# Patient Record
Sex: Female | Born: 1955 | Race: Black or African American | Hispanic: No | Marital: Married | Smoking: Never smoker
Health system: Southern US, Community
[De-identification: ages and names within clinical notes are randomized; demographics above are authoritative.]

## PROBLEM LIST (undated history)

## (undated) DIAGNOSIS — I10 Essential (primary) hypertension: Secondary | ICD-10-CM

## (undated) DIAGNOSIS — H3552 Pigmentary retinal dystrophy: Secondary | ICD-10-CM

## (undated) DIAGNOSIS — K219 Gastro-esophageal reflux disease without esophagitis: Secondary | ICD-10-CM

## (undated) DIAGNOSIS — M1990 Unspecified osteoarthritis, unspecified site: Secondary | ICD-10-CM

## (undated) DIAGNOSIS — E785 Hyperlipidemia, unspecified: Secondary | ICD-10-CM

## (undated) HISTORY — DX: Gastro-esophageal reflux disease without esophagitis: K21.9

## (undated) HISTORY — DX: Hyperlipidemia, unspecified: E78.5

## (undated) HISTORY — DX: Essential (primary) hypertension: I10

## (undated) HISTORY — DX: Pigmentary retinal dystrophy: H35.52

## (undated) HISTORY — DX: Unspecified osteoarthritis, unspecified site: M19.90

## (undated) HISTORY — PX: ABDOMINAL HYSTERECTOMY: SHX81

---

## 1999-10-09 ENCOUNTER — Encounter: Admission: RE | Admit: 1999-10-09 | Discharge: 1999-10-09 | Payer: Self-pay | Admitting: Obstetrics and Gynecology

## 1999-10-09 ENCOUNTER — Encounter: Payer: Self-pay | Admitting: Obstetrics and Gynecology

## 2000-02-08 ENCOUNTER — Encounter: Admission: RE | Admit: 2000-02-08 | Discharge: 2000-02-08 | Payer: Self-pay | Admitting: Obstetrics and Gynecology

## 2000-02-08 ENCOUNTER — Encounter: Payer: Self-pay | Admitting: Obstetrics and Gynecology

## 2000-10-20 ENCOUNTER — Encounter: Admission: RE | Admit: 2000-10-20 | Discharge: 2000-10-20 | Payer: Self-pay | Admitting: Obstetrics and Gynecology

## 2000-10-20 ENCOUNTER — Encounter: Payer: Self-pay | Admitting: Obstetrics and Gynecology

## 2000-10-24 ENCOUNTER — Encounter: Admission: RE | Admit: 2000-10-24 | Discharge: 2000-10-24 | Payer: Self-pay | Admitting: Obstetrics and Gynecology

## 2000-10-24 ENCOUNTER — Encounter: Payer: Self-pay | Admitting: Obstetrics and Gynecology

## 2000-12-20 ENCOUNTER — Encounter: Payer: Self-pay | Admitting: Emergency Medicine

## 2000-12-20 ENCOUNTER — Emergency Department (HOSPITAL_COMMUNITY): Admission: EM | Admit: 2000-12-20 | Discharge: 2000-12-20 | Payer: Self-pay | Admitting: Emergency Medicine

## 2001-03-30 ENCOUNTER — Encounter: Admission: RE | Admit: 2001-03-30 | Discharge: 2001-03-30 | Payer: Self-pay | Admitting: Obstetrics and Gynecology

## 2001-03-30 ENCOUNTER — Encounter: Payer: Self-pay | Admitting: Obstetrics and Gynecology

## 2002-10-24 ENCOUNTER — Encounter: Admission: RE | Admit: 2002-10-24 | Discharge: 2002-10-24 | Payer: Self-pay | Admitting: Obstetrics and Gynecology

## 2002-10-24 ENCOUNTER — Encounter: Payer: Self-pay | Admitting: Obstetrics and Gynecology

## 2003-10-29 ENCOUNTER — Encounter: Admission: RE | Admit: 2003-10-29 | Discharge: 2003-10-29 | Payer: Self-pay | Admitting: Obstetrics and Gynecology

## 2004-11-13 ENCOUNTER — Encounter: Admission: RE | Admit: 2004-11-13 | Discharge: 2004-11-13 | Payer: Self-pay | Admitting: Obstetrics and Gynecology

## 2005-11-18 ENCOUNTER — Encounter: Admission: RE | Admit: 2005-11-18 | Discharge: 2005-11-18 | Payer: Self-pay | Admitting: Obstetrics and Gynecology

## 2005-12-06 ENCOUNTER — Ambulatory Visit: Payer: Self-pay | Admitting: Internal Medicine

## 2005-12-10 ENCOUNTER — Encounter: Admission: RE | Admit: 2005-12-10 | Discharge: 2005-12-10 | Payer: Self-pay | Admitting: Obstetrics and Gynecology

## 2005-12-14 ENCOUNTER — Ambulatory Visit: Payer: Self-pay | Admitting: Internal Medicine

## 2006-01-11 ENCOUNTER — Ambulatory Visit: Payer: Self-pay | Admitting: Internal Medicine

## 2006-03-16 ENCOUNTER — Ambulatory Visit: Payer: Self-pay | Admitting: Internal Medicine

## 2006-06-13 ENCOUNTER — Ambulatory Visit: Payer: Self-pay | Admitting: Internal Medicine

## 2006-08-19 ENCOUNTER — Ambulatory Visit: Payer: Self-pay | Admitting: Internal Medicine

## 2006-08-19 LAB — CONVERTED CEMR LAB
Folate: 6.6 ng/mL
Vitamin B-12: 448 pg/mL (ref 211–911)

## 2006-08-31 ENCOUNTER — Ambulatory Visit: Payer: Self-pay | Admitting: Internal Medicine

## 2006-10-07 ENCOUNTER — Ambulatory Visit: Payer: Self-pay | Admitting: Internal Medicine

## 2006-11-21 ENCOUNTER — Encounter: Admission: RE | Admit: 2006-11-21 | Discharge: 2006-11-21 | Payer: Self-pay | Admitting: Obstetrics and Gynecology

## 2006-12-05 ENCOUNTER — Encounter: Admission: RE | Admit: 2006-12-05 | Discharge: 2006-12-05 | Payer: Self-pay | Admitting: Obstetrics and Gynecology

## 2007-03-31 ENCOUNTER — Ambulatory Visit: Payer: Self-pay | Admitting: Internal Medicine

## 2007-03-31 LAB — CONVERTED CEMR LAB
Albumin: 3.8 g/dL (ref 3.5–5.2)
Basophils Absolute: 0 10*3/uL (ref 0.0–0.1)
Bilirubin, Direct: 0.1 mg/dL (ref 0.0–0.3)
Direct LDL: 134.2 mg/dL
Eosinophils Absolute: 0.1 10*3/uL (ref 0.0–0.6)
GFR calc Af Amer: 113 mL/min
GFR calc non Af Amer: 93 mL/min
HCT: 39 % (ref 36.0–46.0)
Hemoglobin: 13.1 g/dL (ref 12.0–15.0)
Hgb A1c MFr Bld: 6.2 % — ABNORMAL HIGH (ref 4.6–6.0)
Lymphocytes Relative: 43.5 % (ref 12.0–46.0)
MCHC: 33.5 g/dL (ref 30.0–36.0)
MCV: 90.3 fL (ref 78.0–100.0)
Monocytes Absolute: 0.6 10*3/uL (ref 0.2–0.7)
Neutro Abs: 2.6 10*3/uL (ref 1.4–7.7)
Neutrophils Relative %: 43.2 % (ref 43.0–77.0)
Potassium: 3.6 meq/L (ref 3.5–5.1)
RBC: 4.32 M/uL (ref 3.87–5.11)
Sodium: 140 meq/L (ref 135–145)
Triglycerides: 124 mg/dL (ref 0–149)

## 2007-04-07 ENCOUNTER — Ambulatory Visit: Payer: Self-pay | Admitting: Internal Medicine

## 2007-05-01 ENCOUNTER — Ambulatory Visit: Payer: Self-pay | Admitting: Gastroenterology

## 2007-05-16 ENCOUNTER — Ambulatory Visit: Payer: Self-pay | Admitting: Gastroenterology

## 2007-05-16 ENCOUNTER — Encounter: Payer: Self-pay | Admitting: Internal Medicine

## 2007-05-16 ENCOUNTER — Encounter: Payer: Self-pay | Admitting: Gastroenterology

## 2007-05-16 LAB — HM COLONOSCOPY

## 2007-05-18 DIAGNOSIS — I10 Essential (primary) hypertension: Secondary | ICD-10-CM

## 2007-05-18 DIAGNOSIS — I7300 Raynaud's syndrome without gangrene: Secondary | ICD-10-CM | POA: Insufficient documentation

## 2007-07-07 ENCOUNTER — Ambulatory Visit: Payer: Self-pay | Admitting: Internal Medicine

## 2007-07-07 DIAGNOSIS — B351 Tinea unguium: Secondary | ICD-10-CM | POA: Insufficient documentation

## 2007-07-07 DIAGNOSIS — E785 Hyperlipidemia, unspecified: Secondary | ICD-10-CM

## 2007-07-07 DIAGNOSIS — E1169 Type 2 diabetes mellitus with other specified complication: Secondary | ICD-10-CM

## 2007-07-07 DIAGNOSIS — H3552 Pigmentary retinal dystrophy: Secondary | ICD-10-CM | POA: Insufficient documentation

## 2007-07-07 DIAGNOSIS — R1313 Dysphagia, pharyngeal phase: Secondary | ICD-10-CM

## 2007-07-07 LAB — CONVERTED CEMR LAB
CO2: 30 meq/L (ref 19–32)
Cholesterol, target level: 200 mg/dL
GFR calc Af Amer: 97 mL/min
GFR calc non Af Amer: 80 mL/min
Glucose, Bld: 117 mg/dL — ABNORMAL HIGH (ref 70–99)
LDL Goal: 100 mg/dL
Sodium: 142 meq/L (ref 135–145)

## 2007-08-08 ENCOUNTER — Ambulatory Visit: Payer: Self-pay | Admitting: Gastroenterology

## 2007-08-21 ENCOUNTER — Ambulatory Visit: Payer: Self-pay | Admitting: Cardiology

## 2007-08-31 ENCOUNTER — Ambulatory Visit: Payer: Self-pay

## 2007-08-31 ENCOUNTER — Encounter: Payer: Self-pay | Admitting: Cardiology

## 2007-09-12 ENCOUNTER — Ambulatory Visit: Payer: Self-pay | Admitting: Cardiology

## 2007-09-18 ENCOUNTER — Encounter: Payer: Self-pay | Admitting: Internal Medicine

## 2007-10-23 ENCOUNTER — Ambulatory Visit: Payer: Self-pay | Admitting: Gastroenterology

## 2007-10-26 LAB — CONVERTED CEMR LAB: Pap Smear: NORMAL

## 2007-11-15 ENCOUNTER — Ambulatory Visit: Payer: Self-pay | Admitting: Gastroenterology

## 2007-11-22 ENCOUNTER — Ambulatory Visit: Payer: Self-pay | Admitting: Gastroenterology

## 2007-11-22 ENCOUNTER — Encounter: Payer: Self-pay | Admitting: Internal Medicine

## 2007-12-07 ENCOUNTER — Encounter: Admission: RE | Admit: 2007-12-07 | Discharge: 2007-12-07 | Payer: Self-pay | Admitting: Obstetrics and Gynecology

## 2007-12-29 DIAGNOSIS — K644 Residual hemorrhoidal skin tags: Secondary | ICD-10-CM | POA: Insufficient documentation

## 2007-12-29 DIAGNOSIS — D126 Benign neoplasm of colon, unspecified: Secondary | ICD-10-CM | POA: Insufficient documentation

## 2008-05-28 ENCOUNTER — Telehealth: Payer: Self-pay | Admitting: Internal Medicine

## 2008-06-04 ENCOUNTER — Ambulatory Visit: Payer: Self-pay | Admitting: Internal Medicine

## 2008-06-04 LAB — CONVERTED CEMR LAB
BUN: 8 mg/dL (ref 6–23)
Chloride: 105 meq/L (ref 96–112)
Creatinine, Ser: 0.8 mg/dL (ref 0.4–1.2)
GFR calc Af Amer: 96 mL/min
GFR calc non Af Amer: 80 mL/min
Glucose, Bld: 103 mg/dL — ABNORMAL HIGH (ref 70–99)

## 2008-07-16 ENCOUNTER — Ambulatory Visit: Payer: Self-pay | Admitting: Internal Medicine

## 2008-11-15 ENCOUNTER — Ambulatory Visit: Payer: Self-pay | Admitting: Internal Medicine

## 2008-12-24 ENCOUNTER — Ambulatory Visit: Payer: Self-pay | Admitting: Internal Medicine

## 2008-12-24 LAB — CONVERTED CEMR LAB
ALT: 18 U/L
AST: 17 U/L
Albumin: 4 g/dL
Alkaline Phosphatase: 68 U/L
BUN: 8 mg/dL
Basophils Absolute: 0 K/uL
Basophils Relative: 0.2 %
Bilirubin Urine: NEGATIVE
Bilirubin, Direct: 0.1 mg/dL
Blood in Urine, dipstick: NEGATIVE
CO2: 30 meq/L
Calcium: 9.4 mg/dL
Chloride: 102 meq/L
Cholesterol: 202 mg/dL
Creatinine, Ser: 0.8 mg/dL
Creatinine,U: 264 mg/dL
Direct LDL: 133.3 mg/dL
Eosinophils Absolute: 0.1 K/uL
Eosinophils Relative: 1.9 %
GFR calc Af Amer: 96 mL/min
GFR calc non Af Amer: 79 mL/min
Glucose, Bld: 135 mg/dL — ABNORMAL HIGH
Glucose, Urine, Semiquant: NEGATIVE
HCT: 37.9 %
HDL: 39.9 mg/dL
Hemoglobin: 12.9 g/dL
Hgb A1c MFr Bld: 6.9 % — ABNORMAL HIGH
Lymphocytes Relative: 42 %
MCHC: 34 g/dL
MCV: 88.4 fL
Microalb Creat Ratio: 4.2 mg/g
Microalb, Ur: 1.1 mg/dL
Monocytes Absolute: 0.5 K/uL
Monocytes Relative: 9.6 %
Neutro Abs: 2.2 K/uL
Neutrophils Relative %: 46.3 %
Nitrite: NEGATIVE
Platelets: 212 K/uL
Potassium: 3.8 meq/L
RBC: 4.29 M/uL
RDW: 14.4 %
Sodium: 140 meq/L
Specific Gravity, Urine: 1.015
TSH: 1.45 u[IU]/mL
Total Bilirubin: 0.8 mg/dL
Total CHOL/HDL Ratio: 5.1
Total Protein: 8 g/dL
Triglycerides: 95 mg/dL
Urobilinogen, UA: 1
VLDL: 19 mg/dL
WBC Urine, dipstick: NEGATIVE
WBC: 4.8 10*3/microliter
pH: 8.5

## 2008-12-31 ENCOUNTER — Ambulatory Visit: Payer: Self-pay | Admitting: Internal Medicine

## 2009-01-20 ENCOUNTER — Telehealth (INDEPENDENT_AMBULATORY_CARE_PROVIDER_SITE_OTHER): Payer: Self-pay | Admitting: *Deleted

## 2009-03-31 ENCOUNTER — Telehealth: Payer: Self-pay | Admitting: Internal Medicine

## 2009-04-23 LAB — CONVERTED CEMR LAB: Pap Smear: NORMAL

## 2009-06-27 ENCOUNTER — Ambulatory Visit: Payer: Self-pay | Admitting: Family Medicine

## 2009-07-01 ENCOUNTER — Encounter: Admission: RE | Admit: 2009-07-01 | Discharge: 2009-07-01 | Payer: Self-pay | Admitting: Obstetrics and Gynecology

## 2009-07-01 ENCOUNTER — Ambulatory Visit: Payer: Self-pay | Admitting: Internal Medicine

## 2009-07-01 LAB — CONVERTED CEMR LAB
ALT: 25 units/L (ref 0–35)
Albumin: 4.1 g/dL (ref 3.5–5.2)
Alkaline Phosphatase: 76 units/L (ref 39–117)
Bilirubin, Direct: 0 mg/dL (ref 0.0–0.3)
Cholesterol: 218 mg/dL — ABNORMAL HIGH (ref 0–200)
HDL: 44.7 mg/dL (ref >39.00)
Total Protein: 7.9 g/dL (ref 6.0–8.3)
VLDL: 21.8 mg/dL (ref 0.0–40.0)

## 2009-07-10 ENCOUNTER — Ambulatory Visit: Payer: Self-pay | Admitting: Internal Medicine

## 2009-07-10 DIAGNOSIS — L210 Seborrhea capitis: Secondary | ICD-10-CM | POA: Insufficient documentation

## 2009-07-10 DIAGNOSIS — C4490 Unspecified malignant neoplasm of skin, unspecified: Secondary | ICD-10-CM

## 2009-07-15 ENCOUNTER — Telehealth: Payer: Self-pay | Admitting: Internal Medicine

## 2009-07-21 ENCOUNTER — Telehealth: Payer: Self-pay | Admitting: Internal Medicine

## 2009-08-14 ENCOUNTER — Ambulatory Visit: Payer: Self-pay | Admitting: Internal Medicine

## 2009-08-14 LAB — CONVERTED CEMR LAB
Cholesterol: 229 mg/dL — ABNORMAL HIGH (ref 0–200)
Direct LDL: 172.4 mg/dL
HDL: 38.7 mg/dL — ABNORMAL LOW (ref >39.00)

## 2009-09-08 ENCOUNTER — Ambulatory Visit: Payer: Self-pay | Admitting: Family Medicine

## 2009-09-08 DIAGNOSIS — E119 Type 2 diabetes mellitus without complications: Secondary | ICD-10-CM

## 2009-09-08 DIAGNOSIS — E118 Type 2 diabetes mellitus with unspecified complications: Secondary | ICD-10-CM | POA: Insufficient documentation

## 2009-09-08 LAB — CONVERTED CEMR LAB: Hgb A1c MFr Bld: 13.2 % — ABNORMAL HIGH (ref 4.6–6.5)

## 2009-09-10 ENCOUNTER — Telehealth: Payer: Self-pay | Admitting: *Deleted

## 2009-10-01 ENCOUNTER — Ambulatory Visit: Payer: Self-pay | Admitting: Internal Medicine

## 2009-11-07 ENCOUNTER — Ambulatory Visit: Payer: Self-pay | Admitting: Internal Medicine

## 2009-11-07 LAB — CONVERTED CEMR LAB
CO2: 18 meq/L — ABNORMAL LOW (ref 19–32)
Chloride: 103 meq/L (ref 96–112)
Glucose, Bld: 101 mg/dL — ABNORMAL HIGH (ref 70–99)
Sodium: 141 meq/L (ref 135–145)

## 2009-11-14 ENCOUNTER — Ambulatory Visit: Payer: Self-pay | Admitting: Internal Medicine

## 2009-12-24 ENCOUNTER — Ambulatory Visit: Payer: Self-pay | Admitting: Internal Medicine

## 2009-12-24 LAB — CONVERTED CEMR LAB
Alkaline Phosphatase: 59 units/L (ref 39–117)
Basophils Relative: 0.2 % (ref 0.0–3.0)
Bilirubin Urine: NEGATIVE
Bilirubin, Direct: 0.1 mg/dL (ref 0.0–0.3)
CO2: 33 meq/L — ABNORMAL HIGH (ref 19–32)
Calcium: 9.6 mg/dL (ref 8.4–10.5)
Cholesterol: 224 mg/dL — ABNORMAL HIGH (ref 0–200)
Creatinine, Ser: 0.8 mg/dL (ref 0.4–1.2)
Creatinine,U: 182.2 mg/dL
Direct LDL: 159.3 mg/dL
Eosinophils Absolute: 0.1 10*3/uL (ref 0.0–0.7)
GFR calc non Af Amer: 95.74 mL/min (ref >60)
HDL: 53.5 mg/dL (ref >39.00)
Ketones, urine, test strip: NEGATIVE
Lymphocytes Relative: 34.4 % (ref 12.0–46.0)
MCHC: 32.9 g/dL (ref 30.0–36.0)
Microalb, Ur: 0.8 mg/dL (ref 0.0–1.9)
Monocytes Relative: 7.8 % (ref 3.0–12.0)
Neutrophils Relative %: 55.9 % (ref 43.0–77.0)
Nitrite: NEGATIVE
Protein, U semiquant: NEGATIVE
RBC: 3.93 M/uL (ref 3.87–5.11)
Total CHOL/HDL Ratio: 4
Total Protein: 8 g/dL (ref 6.0–8.3)
Triglycerides: 94 mg/dL (ref 0.0–149.0)
Urobilinogen, UA: 0.2
VLDL: 18.8 mg/dL (ref 0.0–40.0)
WBC: 5.2 10*3/uL (ref 4.5–10.5)

## 2009-12-31 ENCOUNTER — Ambulatory Visit: Payer: Self-pay | Admitting: Internal Medicine

## 2010-02-19 ENCOUNTER — Telehealth: Payer: Self-pay | Admitting: Internal Medicine

## 2010-03-25 LAB — HM PAP SMEAR

## 2010-03-31 ENCOUNTER — Ambulatory Visit: Payer: Self-pay | Admitting: Internal Medicine

## 2010-03-31 LAB — CONVERTED CEMR LAB
ALT: 16 units/L (ref 0–35)
Alkaline Phosphatase: 59 units/L (ref 39–117)
Bilirubin, Direct: 0.1 mg/dL (ref 0.0–0.3)
HDL: 48.4 mg/dL (ref >39.00)
Total Protein: 7.7 g/dL (ref 6.0–8.3)

## 2010-04-07 ENCOUNTER — Ambulatory Visit: Payer: Self-pay | Admitting: Internal Medicine

## 2010-07-02 ENCOUNTER — Ambulatory Visit: Payer: Self-pay | Admitting: Internal Medicine

## 2010-07-02 LAB — CONVERTED CEMR LAB
AST: 15 units/L (ref 0–37)
Alkaline Phosphatase: 58 units/L (ref 39–117)
Bilirubin, Direct: 0.1 mg/dL (ref 0.0–0.3)
Total Bilirubin: 0.6 mg/dL (ref 0.3–1.2)
Total CHOL/HDL Ratio: 5

## 2010-07-03 ENCOUNTER — Encounter: Admission: RE | Admit: 2010-07-03 | Discharge: 2010-07-03 | Payer: Self-pay | Admitting: Obstetrics and Gynecology

## 2010-07-14 ENCOUNTER — Ambulatory Visit: Payer: Self-pay | Admitting: Internal Medicine

## 2010-09-29 ENCOUNTER — Ambulatory Visit: Payer: Self-pay | Admitting: Internal Medicine

## 2010-09-29 LAB — CONVERTED CEMR LAB
Albumin: 3.9 g/dL (ref 3.5–5.2)
HDL: 45.7 mg/dL (ref >39.00)
LDL Cholesterol: 126 mg/dL — ABNORMAL HIGH (ref 0–99)
Total CHOL/HDL Ratio: 4
Triglycerides: 77 mg/dL (ref 0.0–149.0)

## 2010-10-06 ENCOUNTER — Ambulatory Visit: Payer: Self-pay | Admitting: Internal Medicine

## 2010-11-15 ENCOUNTER — Encounter: Payer: Self-pay | Admitting: Obstetrics and Gynecology

## 2010-11-15 ENCOUNTER — Encounter: Payer: Self-pay | Admitting: Internal Medicine

## 2010-11-24 NOTE — Progress Notes (Signed)
Summary: new rx request---was on samples  Phone Note Refill Request Call back at Work Phone [phone] Message from:  Patient---live call  Refills Requested: Medication #1:  JANUMET 50-1000 MG TABS one by mouth two times a day [employer].Marland KitchenMarland KitchenMarland KitchenNew rx request.  Was on samples  Initial call taken by: Warnell Forester,  February 19, 2010 10:02 AM Caller: Patient---live call    Prescriptions: JANUMET 50-1000 MG TABS (SITAGLIPTIN-METFORMIN HCL) one by mouth two times a day  #60 x 6   Entered by:   Willy Eddy, LPN   Authorized by:   Stacie Glaze MD   Signed by:   Willy Eddy, LPN on 09/81/1914   Method used:   Electronically to        [facility] [address] [phone]* (retail)       [address]       [place], [place]  [address]       Ph: [phone]       Fax: [phone]   RxID:   [id number]

## 2010-11-24 NOTE — Assessment & Plan Note (Signed)
Summary: CPX/NO PAP/NJR   Vital Signs:  Patient profile:   55 year old female Height:      65 inches Weight:      182 pounds BMI:     30.40 Temp:     98.2 degrees F oral Pulse rate:   92 / minute Resp:     14 per minute BP sitting:   132 / 80  (left arm)  Vitals Entered By: Willy Eddy, LPN (December 31, 1608 2:15 PM) CC: cpx Is Patient Diabetic? Yes Did you bring your meter with you today? No   CC:  cpx.  History of Present Illness: The pt was asked about all immunizations, health maint. 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 One By Mouth Daily 2)  Prevacid 15 Mg Cpdr (Lansoprazole) .... Once Daily 3)  Vitamin D 2000 Unit Tabs (Cholecalciferol) .Marland Kitchen.. 1 Once Daily Along With 81191 Weekly 4)  Clobetasol Propionate 0.05 % Foam (Clobetasol Propionate) .... Apply To Sclap As Directed 5)  Fish Oil Concentrate 1000 Mg Caps (Omega-3 Fatty Acids) .... One By Mouth  Two Times A Day or Two A Day 6)   Derma-Smoothe/fs Scalp 0.01 % Oil (Fluocinolone Acetonide) .... Apply To Scalp As Directed 7)  Janumet 50-1000 Mg Tabs (Sitagliptin-Metformin Hcl) .... One By Mouth Two Times A Day 8)  Accu-Chek Aviva  Strp (Glucose Blood) .... Test Cbg's Two Times A Day As Directed  Current Medications (verified): 1)  Benicar Hct 20-12.5 Mg  Tabs (Olmesartan Medoxomil-Hctz) .... One By Mouth Daily 2)  Prevacid 15 Mg Cpdr (Lansoprazole) .... Once Daily 3)  Vitamin D 2000 Unit Tabs (Cholecalciferol) .Marland Kitchen.. 1 Once Daily Along With 47829 Weekly 4)  Clobetasol Propionate 0.05 % Foam (Clobetasol Propionate) .... Apply To Sclap As Directed 5)  Fish Oil Concentrate 1000 Mg Caps (Omega-3 Fatty Acids) .... One By Mouth  Two Times A Day or Two A Day 6)  Derma-Smoothe/fs Scalp 0.01 % Oil (Fluocinolone Acetonide) .... Apply To Scalp As Directed 7)  Janumet 50-1000 Mg Tabs (Sitagliptin-Metformin Hcl) .... One By Mouth Two Times A Day 8)  Accu-Chek Aviva  Strp (Glucose Blood) .... Test Cbg's Two Times A Day As Directed 9)  Livalo 2 Mg Tabs (Pitavastatin Calcium) .... One By Mouth Weekly  Allergies (verified): No Known Drug Allergies  Contraindications/Deferment of Procedures/Staging:    Test/Procedure: FLU VAX    Reason for deferment: patient declined   Past History:  Family History: Last updated: 05/18/2007 Family History Diabetes 1st degree relative Family History  Hypertension  Social History: Last updated: 07/07/2007 Never Smoked Alcohol use-no Occupation: [employer] Married  Risk Factors: Caffeine Use: 0 (07/07/2007) Exercise: yes (07/07/2007)  Risk Factors: Smoking Status: never (12/31/2009) Passive Smoke Exposure: no (12/31/2009)  Past medical, surgical, family and social histories (including risk factors) reviewed, and no changes noted (except as noted below).  Past Medical History: Reviewed history from 12/29/2007 and no changes required. Hypertension RA retinitis  pigmentosa Hyperlipidemia Diabetes mellitus, type II gerd arthritis  Past Surgical History: Reviewed history from 07/07/2007 and no changes required. Hysterectomy Caesarean section  x 2  Family History: Reviewed history from 05/18/2007 and no changes required. Family History Diabetes 1st degree relative Family History Hypertension  Social History: Reviewed history from 07/07/2007 and no changes required. Never Smoked Alcohol use-no Occupation: [employer] Married  Review of Systems  The patient denies anorexia, fever, weight loss, weight gain, vision loss, decreased hearing, hoarseness, chest pain, syncope, dyspnea on exertion, peripheral edema, prolonged cough, headaches, hemoptysis, abdominal pain, melena, hematochezia, severe indigestion/heartburn, hematuria, incontinence, genital sores, muscle weakness, suspicious skin lesions, transient blindness, difficulty walking, depression, unusual weight change, abnormal bleeding, enlarged lymph nodes, angioedema, and breast masses.    Physical Exam  General:  Well-developed,well-nourished,in no acute distress; alert,appropriate and cooperative throughout examination Head:  normocephalic, focal alopecia, and scaling.   Eyes:  pupils equal and pupils round.   Ears:  R canal inflamed and R Canal drainage.   Nose:  no external deformity and no nasal discharge.   Mouth:  Oral mucosa and oropharynx without lesions or exudates.  Teeth in good repair. Neck:  No deformities, masses, or tenderness noted. Heart:  normal rate and regular rhythm.   Abdomen:  soft and non-tender.   Pulses:  R and L carotid,radial,femoral,dorsalis pedis and posterior tibial pulses are full and equal bilaterally Extremities:  No clubbing, cyanosis, edema, or deformity noted with normal full range of motion of all joints.   Neurologic:  No cranial nerve deficits noted. Station and gait are normal. Plantar reflexes are down-going bilaterally. DTRs are symmetrical  throughout. Sensory, motor and coordinative functions appear intact.   Impression & Recommendations:  Problem # 1:  PREVENTIVE HEALTH CARE (ICD-V70.0) The pt was asked about all immunizations, health maint. services that are appropriate to their age and was given guidance on diet exercize  and weight management  Mammogram: normal (04/23/2009) Pap smear: normal (04/23/2009) Colonoscopy: normal (05/16/2007) Td Booster: Historical (10/25/2006)   Flu Vax: Historical (10/25/2005)   Chol: 224 (12/24/2009)   HDL: 53.50 (12/24/2009)   LDL: DEL (12/24/2008)   TG: 94.0 (12/24/2009) TSH: 1.17 (12/24/2009)   HgbA1C: 6.2 (12/24/2009)   Next mammogram due:: 04/2010 (12/31/2009) Next Colonoscopy due:: 05/2017 (12/31/2008)  Discussed using sunscreen, use of alcohol, drug use, self breast exam, routine dental care, routine eye care, schedule for GYN exam, routine physical exam, seat belts, multiple vitamins, osteoporosis prevention, adequate calcium intake in diet, recommendations for immunizations, mammograms and Pap smears.  Discussed exercise and checking cholesterol.  Discussed gun safety, safe sex, and contraception.  Problem # 2:  HYPERLIPIDEMIA (ICD-272.4)  Labs Reviewed: SGOT: 13 (12/24/2009)   SGPT: 17 (12/24/2009)  Lipid Goals: Chol Goal: 200 (07/07/2007)   HDL Goal: 40 (07/07/2007)   LDL Goal: 100 (07/07/2007)   TG Goal: 150 (07/07/2007)  Prior 10 Yr Risk Heart Disease: Not enough information (08/14/2009)   HDL:53.50 (12/24/2009), 38.70 (08/14/2009)  LDL:DEL (12/24/2008), DEL (03/31/2007)  Chol:224 (12/24/2009), 229 (08/14/2009)  Trig:94.0 (12/24/2009), 109.0 (07/01/2009)  Her updated medication list for  this problem includes:    Livalo 2 Mg Tabs (Pitavastatin calcium) ..... One by mouth weekly  Complete Medication List: 1)  Benicar Hct 20-12.5 Mg Tabs (Olmesartan medoxomil-hctz) .... One by mouth daily 2)  Prevacid 15 Mg Cpdr (Lansoprazole) .... Once daily 3)  Vitamin D 2000 Unit Tabs  (Cholecalciferol) .Marland Kitchen.. 1 once daily along with 16073 weekly 4)  Clobetasol Propionate 0.05 % Foam (Clobetasol propionate) .... Apply to sclap as directed 5)  Fish Oil Concentrate 1000 Mg Caps (Omega-3 fatty acids) .... One by mouth  two times a day or two a day 6)  Derma-smoothe/fs Scalp 0.01 % Oil (Fluocinolone acetonide) .... Apply to scalp as directed 7)  Janumet 50-1000 Mg Tabs (Sitagliptin-metformin hcl) .... One by mouth two times a day 8)  Accu-chek Aviva Strp (Glucose blood) .... Test cbg's two times a day as directed 9)  Livalo 2 Mg Tabs (Pitavastatin calcium) .... One by mouth weekly  Other Orders: EKG w/ Interpretation (93000)  Patient Instructions: 1)  Please schedule a follow-up appointment in 3 months. 2)  Hepatic Panel prior to visit, ICD-9:995.20 3)  Lipid Panel prior to visit, ICD-9:272.4 4)  HbgA1C prior to visit, ICD-9:250.00   Preventive Care Screening  Mammogram:    Date:  04/23/2009    Next Due:  04/2010    Results:  normal   Pap Smear:    Date:  04/23/2009    Next Due:  04/2010    Results:  normal

## 2010-11-24 NOTE — Assessment & Plan Note (Signed)
Summary: 3 month rov/njr   Vital Signs:  Patient profile:   55 year old female Height:      65 inches Weight:      178 pounds BMI:     29.73 Temp:     98.2 degrees F oral Pulse rate:   76 / minute Resp:     14 per minute BP sitting:   130 / 80  (left arm)  Vitals Entered By: Willy Eddy, LPN (April 07, 2010 1:53 PM) CC: roa labs, Lipid Management   Primary Care Provider:  Stacie Glaze MD  CC:  roa labs and Lipid Management.  History of Present Illness:  Hyperlipidemia Follow-Up      This is a 55 year old woman who presents for Hyperlipidemia follow-up.  The patient denies muscle aches, GI upset, abdominal pain, flushing, itching, constipation, diarrhea, and fatigue.  The patient denies the following symptoms: chest pain/pressure, exercise intolerance, dypsnea, palpitations, syncope, and pedal edema.  Compliance with medications (by patient report) has been near 100%.  Dietary compliance has been good.  The patient reports exercising occasionally.  Adjunctive measures currently used by the patient include limiting alcohol consumpton.    the pts blood pressure is stable the pts states cbgs are god and that she has noted mild hypoglucemia in the afternoons  Lipid Management History:      Positive NCEP/ATP III risk factors include female age 74 years old or older, diabetes, and hypertension.  Negative NCEP/ATP III risk factors include no history of early menopause without estrogen hormone replacement, no family history for ischemic heart disease, non-tobacco-user status, no ASHD (atherosclerotic heart disease), no prior stroke/TIA, no peripheral vascular disease, and no history of aortic aneurysm.     Preventive Screening-Counseling & Management  Alcohol-Tobacco     Smoking Status: never     Passive Smoke Exposure: no  Problems Prior to Update: 1)  Diab w/o Mention Comp Type Ii/uns Type Uncntrl  (ICD-250.02) 2)  Other Malignant Neoplasm Skin Site Unspecified   (ICD-173.9) 3)  Seborrheic Capitis  (ICD-690.11) 4)  Preventive Health Care  (ICD-V70.0) 5)  External Hemorrhoids  (ICD-455.3) 6)  Colonic Polyps  (ICD-211.3) 7)  Dermatophytosis, Nail  (ICD-110.1) 8)  Dysphagia, Pharyngeal Phase  (ZOX-096.04) 9)  R/O Diabetes Mellitus, Type II  (ICD-250.00) 10)  Hyperlipidemia  (ICD-272.4) 11)  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) 12)  Family History Diabetes 1st Degree Relative  (ICD-V18.0) 13)  Raynaud's Syndrome  (ICD-443.0) 14)  Hypertension  (ICD-401.9)  Current Problems (verified): 1)  Diab w/o Mention Comp Type Ii/uns Type Uncntrl  (ICD-250.02) 2)  Other Malignant Neoplasm Skin Site Unspecified  (ICD-173.9) 3)  Seborrheic Capitis  (ICD-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 One By Mouth Daily 2)  Prevacid 15 Mg Cpdr (Lansoprazole) .... Once Daily 3)  Vitamin D 2000 Unit Tabs (Cholecalciferol) .Marland Kitchen.. 1 Once Daily Along With 14782 Weekly 4)  Clobetasol Propionate 0.05 % Foam (Clobetasol Propionate) .... Apply To Sclap As Directed 5)  Fish Oil Concentrate 1000 Mg  Caps (Omega-3 Fatty Acids) .... One By Mouth  Two Times A Day or Two A Day 6)  Derma-Smoothe/fs Scalp 0.01 % Oil (Fluocinolone Acetonide) .... Apply To Scalp As Directed 7)  Janumet 50-1000 Mg Tabs (Sitagliptin-Metformin Hcl) .... One By Mouth Two Times A Day 8)  Accu-Chek Aviva  Strp (Glucose Blood) .... Test Cbg's Two Times A Day As Directed 9)  Livalo 2 Mg Tabs (Pitavastatin  Calcium) .... One By Mouth Weekly  Current Medications (verified): 1)  Benicar Hct 20-12.5 Mg  Tabs (Olmesartan Medoxomil-Hctz) .... One By Mouth Daily 2)  Prevacid 15 Mg Cpdr (Lansoprazole) .... Once Daily 3)  Vitamin D 2000 Unit Tabs (Cholecalciferol) .Marland Kitchen.. 1 Once Daily Along With 16109 Weekly 4)  Clobetasol Propionate 0.05 % Foam (Clobetasol Propionate) .... Apply To Sclap As Directed 5)  Fish Oil Concentrate 1000 Mg Caps (Omega-3 Fatty Acids) .... One By Mouth  Two Times A Day or Two A Day 6)  Derma-Smoothe/fs Scalp 0.01 % Oil (Fluocinolone Acetonide) .... Apply To Scalp As Directed 7)  Janumet 50-500 Mg Tabs (Sitagliptin-Metformin Hcl) .... One By Mouth Two Times A Day 8)  Accu-Chek Aviva  Strp (Glucose Blood) .... Test Cbg's Two Times A Day As Directed 9)  Livalo 4 Mg Tabs (Pitavastatin Calcium) .... One By Mouth  Weekly  Allergies (verified): No Known Drug Allergies  Past History:  Family History: Last updated: 05/18/2007 Family History Diabetes 1st degree relative Family History Hypertension  Social History: Last updated: 07/07/2007 Never Smoked Alcohol use-no Occupation: [employer] Married  Risk Factors: Caffeine Use: 0 (07/07/2007) Exercise: yes (07/07/2007)  Risk Factors: Smoking Status: never (04/07/2010) Passive Smoke Exposure: no (04/07/2010)  Past medical, surgical, family and social histories (including risk factors) reviewed, and no changes noted (except as noted below).  Past Medical History: Reviewed history from 12/29/2007 and no changes required. Hypertension RA retinitis pigmentosa Hyperlipidemia Diabetes mellitus, type II gerd arthritis  Past Surgical History: Reviewed history from 07/07/2007 and no changes required. Hysterectomy Caesarean section  x 2  Family History: Reviewed history from 05/18/2007 and no changes required. Family History Diabetes 1st degree relative Family History Hypertension  Social History: Reviewed history from  07/07/2007 and no changes required. Never Smoked Alcohol use-no Occupation: [employer] Married  Review of Systems  The patient denies anorexia, fever, weight loss, weight gain, vision loss, decreased hearing, hoarseness, chest pain, syncope, dyspnea on exertion, peripheral edema, prolonged cough, headaches, hemoptysis, abdominal pain, melena, hematochezia, severe indigestion/heartburn, hematuria, incontinence, genital sores, muscle weakness, suspicious skin lesions, transient blindness, difficulty walking, depression, unusual weight change, abnormal bleeding, enlarged lymph nodes, angioedema, and breast masses.    Physical Exam  General:  Well-developed,well-nourished,in no acute distress; alert,appropriate and cooperative throughout examination Head:  normocephalic, focal alopecia, and scaling.   Eyes:  pupils equal and pupils round.   Ears:  R canal inflamed and R Canal drainage.   Nose:  no external deformity and no nasal discharge.   Mouth:  Oral mucosa and oropharynx without lesions or exudates.  Teeth in good repair. Neck:  No deformities, masses, or tenderness noted. Lungs:  Normal respiratory effort, chest expands symmetrically. Lungs are clear to auscultation, no crackles or wheezes. Heart:  normal rate and regular rhythm.   Abdomen:  soft and non-tender.   Msk:  normal ROM, no joint tenderness, and no joint swelling.   Pulses:  R and L carotid,radial,femoral,dorsalis pedis and posterior tibial pulses are full and equal bilaterally Extremities:  No clubbing, cyanosis, edema, or deformity noted with  normal full range of motion of all joints.   Neurologic:  No cranial nerve deficits noted. Station and gait are normal. Plantar reflexes are down-going bilaterally. DTRs are symmetrical throughout. Sensory, motor and coordinative functions appear intact.   Impression & Recommendations:  Problem # 1:  DIAB W/O MENTION COMP TYPE II/UNS TYPE UNCNTRL (ICD-250.02) Assessment Unchanged  Her  updated medication list for this problem includes:    Benicar Hct 20-12.5 Mg Tabs (Olmesartan medoxomil-hctz) ..... One by mouth daily    Janumet 50-500 Mg Tabs (Sitagliptin-metformin hcl) ..... One by mouth two times a day  Labs Reviewed: Creat: 0.8 (12/24/2009)    Reviewed HgBA1c results: 6.2 (12/24/2009)  7.9 (11/07/2009)  Problem # 2:  HYPERLIPIDEMIA (ICD-272.4) Assessment: Improved change the  meds to  4 mg Her updated medication list for this problem includes:    Livalo 4 Mg Tabs (Pitavastatin calcium) ..... One by mouth  weekly  Labs Reviewed: SGOT: 13 (03/31/2010)   SGPT: 16 (03/31/2010)  Lipid Goals: Chol Goal: 200 (07/07/2007)   HDL Goal: 40 (07/07/2007)   LDL Goal: 100 (07/07/2007)   TG Goal: 150 (07/07/2007)  Prior 10 Yr Risk Heart Disease: Not enough information (08/14/2009)   HDL:48.40 (03/31/2010), 53.50 (12/24/2009)  LDL:DEL (12/24/2008), DEL (03/31/2007)  Chol:203 (03/31/2010), 224 (12/24/2009)  Trig:92.0 (03/31/2010), 94.0 (12/24/2009)  Problem # 3:  HYPERTENSION (ICD-401.9) Assessment: Unchanged  Her updated medication list for this problem includes:    Benicar Hct 20-12.5 Mg Tabs (Olmesartan medoxomil-hctz) ..... One by mouth daily  BP today: 130/80 Prior BP: 132/80 (12/31/2009)  Prior 10 Yr Risk Heart Disease: Not enough information (08/14/2009)  Labs Reviewed: K+: 3.9 (12/24/2009) Creat: : 0.8 (12/24/2009)   Chol: 203 (03/31/2010)   HDL: 48.40 (03/31/2010)   LDL: DEL (12/24/2008)   TG: 92.0 (03/31/2010)  Complete Medication List: 1)  Benicar Hct 20-12.5 Mg Tabs (Olmesartan medoxomil-hctz) .... One by mouth daily 2)  Prevacid 15 Mg Cpdr (Lansoprazole) .... Once daily 3)  Vitamin D 2000 Unit Tabs (Cholecalciferol) .Marland Kitchen.. 1 once daily along with 98119 weekly 4)  Clobetasol Propionate 0.05 % Foam (Clobetasol propionate) .... Apply to sclap as directed 5)  Fish Oil Concentrate 1000 Mg Caps (Omega-3 fatty acids) .... One by mouth  two times a day or two a  day 6)  Derma-smoothe/fs Scalp 0.01 % Oil (Fluocinolone acetonide) .... Apply to scalp as directed 7)  Janumet 50-500 Mg Tabs (Sitagliptin-metformin hcl) .... One by mouth two times a day 8)  Accu-chek Aviva Strp (Glucose blood) .... Test cbg's two times a day as directed 9)  Livalo 4 Mg Tabs (Pitavastatin calcium) .... One by mouth  weekly  Lipid Assessment/Plan:      Based on NCEP/ATP III, the patient's risk factor category is "history of diabetes".  The patient's lipid goals are as follows: Total cholesterol goal is 200; LDL cholesterol goal is 100; HDL cholesterol goal is 40; Triglyceride goal is 150.  Her LDL cholesterol goal has been met.    Patient Instructions: 1)  Please schedule a follow-up appointment in 3 months. 2)  HbgA1C prior to visit, ICD-9:995.20 3)  Hepatic Panel prior to visit, ICD-9:995.20 4)  Lipid Panel prior to visit, ICD-9:272.4

## 2010-11-24 NOTE — Assessment & Plan Note (Signed)
Summary: 3 month follow up/cjr   Vital Signs:  Patient profile:   55 year old female Height:      65 inches Weight:      182 pounds BMI:     30.40 Temp:     98.2 degrees F oral Pulse rate:   721 / minute Resp:     14 per minute BP sitting:   126 / 74  (left arm)  Vitals Entered By: Willy Eddy, LPN (July 14, 2010 4:37 PM)  Nutrition Counseling: Patient's BMI is greater than 25 and therefore counseled on weight management options. CC: roa labs, Hypertension Management, Lipid Management Is Patient Diabetic? Yes Did you bring your meter with you today? No   Primary Care Georgie Haque:  Stacie Glaze MD  CC:  roa labs, Hypertension Management, and Lipid Management.  History of Present Illness: FOLLOW UP OF LIPIDS AND DM labs were drawn in anicipation of  screening and mantinance of goals also follow up of blood pressure  Hypertension History:      She denies headache, chest pain, palpitations, dyspnea with exertion, orthopnea, PND, peripheral edema, visual symptoms, neurologic problems, syncope, and side effects from treatment.        Positive major cardiovascular risk factors include female age 74 years old or older, diabetes, hyperlipidemia, and hypertension.  Negative major cardiovascular risk factors include negative family history for ischemic heart disease and non-tobacco-user status.        Further assessment for target organ damage reveals no history of ASHD, cardiac end-organ damage (CHF/LVH), stroke/TIA, peripheral vascular disease, renal insufficiency, or hypertensive retinopathy.    Lipid Management History:      Positive NCEP/ATP III risk factors include female age 58 years old or older, diabetes, and hypertension.  Negative NCEP/ATP III risk factors include no history of early menopause without estrogen hormone replacement, no family history for ischemic heart disease, non-tobacco-user status, no ASHD (atherosclerotic heart disease), no prior stroke/TIA, no  peripheral vascular disease, and no history of aortic aneurysm.     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) 4)  Preventive Health Care  (ICD-V70.0) 5)  External Hemorrhoids  (ICD-455.3) 6)  Colonic Polyps  (ICD-211.3) 7)  Dermatophytosis, Nail  (ICD-110.1) 8)  Dysphagia, Pharyngeal Phase  (VZD-638.75) 9)  Hyperlipidemia  (ICD-272.4) 10)  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) 11)  Family History Diabetes 1st Degree Relative  (ICD-V18.0) 12)  Raynaud's Syndrome  (ICD-443.0) 13)  Hypertension  (ICD-401.9)  Current Problems (verified): 1)  Diab w/o Mention Comp Type Ii/uns Type Uncntrl  (ICD-250.02) 2)  Other Malignant Neoplasm Skin Site Unspecified  (ICD-173.9) 3)  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 One By Mouth Daily 2)  Prevacid 15 Mg  Cpdr (Lansoprazole) .... Once Daily 3)  Vitamin D 2000 Unit Tabs (Cholecalciferol) .Marland Kitchen.. 1 Once Daily Along With 13086 Weekly 4)  Clobetasol Propionate 0.05 % Foam (Clobetasol Propionate) .... Apply To Sclap As Directed 5)  Fish Oil Concentrate 1000 Mg Caps (Omega-3 Fatty Acids) .... One By Mouth  Two Times A Day or Two A Day 6)   Derma-Smoothe/fs Scalp 0.01 % Oil (Fluocinolone Acetonide) .... Apply To Scalp As Directed 7)  Janumet 50-500 Mg Tabs (Sitagliptin-Metformin Hcl) .... One By Mouth Two Times A Day 8)  Accu-Chek Aviva  Strp (Glucose Blood) .... Test Cbg's Two Times A Day As Directed 9)  Livalo 4 Mg Tabs (Pitavastatin Calcium) .... One By Mouth  Weekly  Current Medications (verified): 1)  Benicar Hct 20-12.5 Mg  Tabs (Olmesartan Medoxomil-Hctz) .... One By Mouth Daily 2)  Prevacid 15 Mg Cpdr (Lansoprazole) .... Once Daily 3)  Vitamin D 2000 Unit Tabs (Cholecalciferol) .Marland Kitchen.. 1 Once Daily 4)  Clobetasol Propionate 0.05 % Foam (Clobetasol Propionate) .... Apply To Sclap As Directed 5)  Fish Oil Concentrate 1000 Mg Caps (Omega-3 Fatty Acids) .... One By Mouth  Two Times A Day or Two A Day 6)  Derma-Smoothe/fs Scalp 0.01 % Oil (Fluocinolone Acetonide) .... Apply To Scalp As Directed 7)  Janumet 50-500 Mg Tabs (Sitagliptin-Metformin Hcl) .... One By Mouth Two Times A Day 8)  Accu-Chek Aviva  Strp (Glucose Blood) .... Test Cbg's Two Times A Day As Directed 9)  Crestor 20 Mg Tabs (Rosuvastatin Calcium) .... One By Mouth Weekly  Allergies (verified): No Known Drug Allergies  Past History:  Family History: Last updated: 05/18/2007 Family History Diabetes 1st degree relative Family History Hypertension  Social History: Last updated: 07/07/2007 Never Smoked Alcohol use-no Occupation: [employer] Married  Risk Factors: Caffeine Use: 0 (07/07/2007) Exercise: yes (07/07/2007)  Risk Factors: Smoking Status: never (07/14/2010) Passive Smoke Exposure: no (07/14/2010)  Past medical, surgical, family and social histories (including risk factors) reviewed, and no changes noted (except as noted below).  Past Medical History: Reviewed history from 12/29/2007 and no changes required. Hypertension RA retinitis pigmentosa Hyperlipidemia Diabetes mellitus, type II gerd arthritis  Past Surgical History: Reviewed  history from 07/07/2007 and no changes required. Hysterectomy Caesarean section  x 2  Family History: Reviewed history from 05/18/2007 and no changes required. Family History Diabetes 1st degree relative Family History Hypertension  Social History: Reviewed history from 07/07/2007 and no changes required. Never Smoked Alcohol use-no Occupation: [employer] Married  Physical Exam  General:  Well-developed,well-nourished,in no acute distress; alert,appropriate and cooperative throughout examination Head:  normocephalic, focal alopecia, and scaling.   Eyes:  pupils equal and pupils round.   Ears:  R canal inflamed and R Canal drainage.   Nose:  no external deformity and no nasal discharge.   Mouth:  Oral mucosa and oropharynx without lesions or exudates.  Teeth in good repair. Neck:  No deformities, masses, or tenderness noted. Lungs:  Normal respiratory effort, chest expands symmetrically. Lungs are clear to auscultation, no crackles or wheezes. Heart:  normal rate and regular rhythm.   Abdomen:  soft and non-tender.   Msk:  normal ROM, no joint tenderness, and no joint swelling.    Diabetes Management Exam:    Foot Exam (with socks and/or shoes not present):       Sensory-Pinprick/Light touch:          Left medial foot (L-4): normal          Left dorsal foot (L-5): normal  Left lateral foot (S-1): normal          Right medial foot (L-4): normal          Right dorsal foot (L-5): normal          Right lateral foot (S-1): normal       Sensory-Monofilament:          Left foot: normal          Right foot: normal       Inspection:          Left foot: normal          Right foot: normal       Nails:          Left foot: normal          Right foot: normal   Impression & Recommendations:  Problem # 1:  HYPERTENSION (ICD-401.9) Assessment Improved  Her updated medication list for this problem includes:    Benicar Hct 20-12.5 Mg Tabs (Olmesartan medoxomil-hctz) ..... One by  mouth daily  BP today: 126/74 Prior BP: 130/80 (04/07/2010)  Prior 10 Yr Risk Heart Disease: Not enough information (08/14/2009)  Labs Reviewed: K+: 3.9 (12/24/2009) Creat: : 0.8 (12/24/2009)   Chol: 204 (07/02/2010)   HDL: 42.70 (07/02/2010)   LDL: DEL (12/24/2008)   TG: 82.0 (07/02/2010)  Problem # 2:  DIAB W/O MENTION COMP TYPE II/UNS TYPE UNCNTRL (ICD-250.02)  Her updated medication list for this problem includes:    Benicar Hct 20-12.5 Mg Tabs (Olmesartan medoxomil-hctz) ..... One by mouth daily    Janumet 50-500 Mg Tabs (Sitagliptin-metformin hcl) ..... One by mouth two times a day  Labs Reviewed: Creat: 0.8 (12/24/2009)    Reviewed HgBA1c results: 6.0 (07/02/2010)  6.2 (12/24/2009)  Problem # 3:  HYPERLIPIDEMIA (ICD-272.4)  Her updated medication list for this problem includes:    Crestor 20 Mg Tabs (Rosuvastatin calcium) ..... One by mouth weekly  Labs Reviewed: SGOT: 15 (07/02/2010)   SGPT: 16 (07/02/2010)  Lipid Goals: Chol Goal: 200 (07/07/2007)   HDL Goal: 40 (07/07/2007)   LDL Goal: 100 (07/07/2007)   TG Goal: 150 (07/07/2007)  Prior 10 Yr Risk Heart Disease: Not enough information (08/14/2009)   HDL:42.70 (07/02/2010), 48.40 (03/31/2010)  LDL:DEL (12/24/2008), DEL (03/31/2007)  Chol:204 (07/02/2010), 203 (03/31/2010)  Trig:82.0 (07/02/2010), 92.0 (03/31/2010)  Complete Medication List: 1)  Benicar Hct 20-12.5 Mg Tabs (Olmesartan medoxomil-hctz) .... One by mouth daily 2)  Prevacid 15 Mg Cpdr (Lansoprazole) .... Once daily 3)  Vitamin D 2000 Unit Tabs (Cholecalciferol) .Marland Kitchen.. 1 once daily 4)  Clobetasol Propionate 0.05 % Foam (Clobetasol propionate) .... Apply to sclap as directed 5)  Fish Oil Concentrate 1000 Mg Caps (Omega-3 fatty acids) .... One by mouth  two times a day or two a day 6)  Derma-smoothe/fs Scalp 0.01 % Oil (Fluocinolone acetonide) .... Apply to scalp as directed 7)  Janumet 50-500 Mg Tabs (Sitagliptin-metformin hcl) .... One by mouth two times a  day 8)  Accu-chek Aviva Strp (Glucose blood) .... Test cbg's two times a day as directed 9)  Crestor 20 Mg Tabs (Rosuvastatin calcium) .... One by mouth weekly  Hypertension Assessment/Plan:      The patient's hypertensive risk group is category C: Target organ damage and/or diabetes.  Today's blood pressure is 126/74.  Her blood pressure goal is < 130/80.  Lipid Assessment/Plan:      Based on NCEP/ATP III, the patient's risk factor category is "history of diabetes".  The patient's lipid goals are  as follows: Total cholesterol goal is 200; LDL cholesterol goal is 100; HDL cholesterol goal is 40; Triglyceride goal is 150.  Her LDL cholesterol goal has been met.     Patient Instructions: 1)  Please schedule a follow-up appointment in 3 months. 2)  Hepatic Panel prior to visit, ICD-9:995.20 3)  Lipid Panel prior to visit, ICD-9:272.4

## 2010-11-24 NOTE — Assessment & Plan Note (Signed)
Summary: 3 mo rov/mm   Vital Signs:  Patient profile:   55 year old female Height:      65 inches Weight:      184 pounds BMI:     30.73 Temp:     98.2 degrees F oral Pulse rate:   80 / minute Resp:     14 per minute BP sitting:   140 / 80  (left arm)  Vitals Entered By: Willy Eddy, LPN (November 14, 2009 9:40 AM) CC: roa labs- mother is currently in hosptial , Hypertension Management, Type 2 diabetes mellitus follow-up   CC:  roa labs- mother is currently in hosptial , Hypertension Management, and Type 2 diabetes mellitus follow-up.  History of Present Illness: lost mother today took off life support ( had a massive stroke) blood pressure follow up and DM follow up  Type 2 Diabetes Mellitus Follow-Up      This is a 55 year old woman who presents for Type 2 diabetes mellitus follow-up.  The patient denies polyuria, polydipsia, blurred vision, self managed hypoglycemia, hypoglycemia requiring help, weight loss, weight gain, and numbness of extremities.  The patient denies the following symptoms: neuropathic pain, chest pain, vomiting, orthostatic symptoms, poor wound healing, intermittent claudication, vision loss, and foot ulcer.  Since the last visit the patient reports good dietary compliance.  The patient has been measuring capillary blood glucose before breakfast and before dinner.  Since the last visit, the patient reports having had eye care by an ophthalmologist.    Hypertension History:      She denies headache, chest pain, palpitations, dyspnea with exertion, orthopnea, PND, peripheral edema, visual symptoms, neurologic problems, syncope, and side effects from treatment.        Positive major cardiovascular risk factors include diabetes, hyperlipidemia, and hypertension.  Negative major cardiovascular risk factors include female age less than 63 years old, negative family history for ischemic heart disease, and non-tobacco-user status.        Further assessment for  target organ damage reveals no history of ASHD, cardiac end-organ damage (CHF/LVH), stroke/TIA, peripheral vascular disease, renal insufficiency, or hypertensive retinopathy.     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 One By Mouth Daily 2)  Prevacid 15 Mg Cpdr (Lansoprazole) .... Once Daily 3)  Vitamin D 2000 Unit Tabs (Cholecalciferol) .Marland Kitchen.. 1 Once Daily Along With 61607 Weekly 4)  Clobetasol Propionate 0.05 % Foam (Clobetasol Propionate) .... Apply To Sclap As Directed 5)  Fish Oil Concentrate 1000 Mg Caps (Omega-3 Fatty Acids) .... One By Mouth  Two Times A Day or Two A Day 6)  Derma-Smoothe/fs Scalp 0.01 % Oil (Fluocinolone Acetonide) .... Apply To Scalp As Directed 7)  Janumet 50-1000  Mg Tabs (Sitagliptin-Metformin Hcl) .... One By Mouth Two Times A Day  Current Medications (verified): 1)  Benicar Hct 20-12.5 Mg  Tabs (Olmesartan Medoxomil-Hctz) .... One By Mouth Daily 2)  Prevacid 15 Mg Cpdr (Lansoprazole) .... Once Daily 3)  Vitamin D 2000 Unit Tabs (Cholecalciferol) .Marland Kitchen.. 1 Once Daily Along With 40347 Weekly 4)  Clobetasol Propionate 0.05 % Foam (Clobetasol Propionate)  .... Apply To Sclap As Directed 5)  Fish Oil Concentrate 1000 Mg Caps (Omega-3 Fatty Acids) .... One By Mouth  Two Times A Day or Two A Day 6)  Derma-Smoothe/fs Scalp 0.01 % Oil (Fluocinolone Acetonide) .... Apply To Scalp As Directed 7)  Janumet 50-1000 Mg Tabs (Sitagliptin-Metformin Hcl) .... One By Mouth Two Times A Day  Allergies (verified): No Known Drug Allergies  Past History:  Family History: Last updated: 05/18/2007 Family History Diabetes 1st degree relative Family History Hypertension  Social History: Last updated: 07/07/2007 Never Smoked Alcohol use-no Occupation: [employer] Married  Risk Factors: Caffeine Use: 0 (07/07/2007) Exercise: yes (07/07/2007)  Risk Factors: Smoking Status: never (11/14/2009) Passive Smoke Exposure: no (11/14/2009)  Past medical, surgical, family and social histories (including risk factors) reviewed, and no changes noted (except as noted below).  Past Medical History: Reviewed history from 12/29/2007 and no changes required. Hypertension RA retinitis pigmentosa Hyperlipidemia Diabetes mellitus, type II gerd arthritis  Past Surgical History: Reviewed history from 07/07/2007 and no changes required. Hysterectomy Caesarean section  x 2  Family History: Reviewed history from 05/18/2007 and no changes required. Family History Diabetes 1st degree relative Family History Hypertension  Social History: Reviewed history from 07/07/2007 and no changes required. Never Smoked Alcohol use-no Occupation: [employer] Married  Review of Systems  The patient denies anorexia, fever, weight loss, weight gain, vision loss, decreased hearing, hoarseness, chest pain, syncope, dyspnea on exertion, peripheral edema, prolonged cough, headaches, hemoptysis, abdominal pain, melena, hematochezia, severe indigestion/heartburn, hematuria, incontinence, genital sores, muscle weakness, suspicious skin lesions, transient blindness, difficulty walking, depression,  unusual weight change, abnormal bleeding, enlarged lymph nodes, angioedema, breast masses, and testicular masses.    Physical Exam  General:  Well-developed,well-nourished,in no acute distress; alert,appropriate and cooperative throughout examination Head:  normocephalic, focal alopecia, and scaling.   Ears:  R canal inflamed and R Canal drainage.   Nose:  no external deformity and no nasal discharge.   Mouth:  Oral mucosa and oropharynx without lesions or exudates.  Teeth in good repair. Neck:  No deformities, masses, or tenderness noted. Lungs:  Normal respiratory effort, chest expands symmetrically. Lungs are clear to auscultation, no crackles or wheezes. Heart:  normal rate and regular rhythm.   Abdomen:  soft and non-tender.   Msk:  normal ROM, no joint tenderness, and no joint swelling.   Extremities:  trace left pedal edema and trace right pedal edema.   Neurologic:  alert & oriented X3 and gait normal.     Impression & Recommendations:  Problem # 1:  DIAB W/O MENTION COMP TYPE II/UNS TYPE UNCNTRL (ICD-250.02)  Her updated medication list for this problem includes:    Benicar Hct 20-12.5 Mg Tabs (Olmesartan medoxomil-hctz) ..... One by mouth daily    Janumet 50-1000 Mg Tabs (Sitagliptin-metformin hcl) ..... One by mouth two times a day  Labs Reviewed: Creat: 0.83 (11/07/2009)    Reviewed HgBA1c results: 7.9 (11/07/2009)  13.2 (09/08/2009)  Problem # 2:  HYPERTENSION (ICD-401.9) stable Her updated medication list for this problem includes:    Benicar Hct 20-12.5 Mg Tabs (Olmesartan medoxomil-hctz) ..... One by  mouth daily  BP today: 140/80 Prior BP: 132/90 (10/01/2009)  Prior 10 Yr Risk Heart Disease: Not enough information (08/14/2009)  Labs Reviewed: K+: 4.5 (11/07/2009) Creat: : 0.83 (11/07/2009)   Chol: 229 (08/14/2009)   HDL: 38.70 (08/14/2009)   LDL: DEL (12/24/2008)   TG: 109.0 (07/01/2009)  Problem # 3:  RAYNAUD'S SYNDROME (ICD-443.0) stable  Complete  Medication List: 1)  Benicar Hct 20-12.5 Mg Tabs (Olmesartan medoxomil-hctz) .... One by mouth daily 2)  Prevacid 15 Mg Cpdr (Lansoprazole) .... Once daily 3)  Vitamin D 2000 Unit Tabs (Cholecalciferol) .Marland Kitchen.. 1 once daily along with 16109 weekly 4)  Clobetasol Propionate 0.05 % Foam (Clobetasol propionate) .... Apply to sclap as directed 5)  Fish Oil Concentrate 1000 Mg Caps (Omega-3 fatty acids) .... One by mouth  two times a day or two a day 6)  Derma-smoothe/fs Scalp 0.01 % Oil (Fluocinolone acetonide) .... Apply to scalp as directed 7)  Janumet 50-1000 Mg Tabs (Sitagliptin-metformin hcl) .... One by mouth two times a day 8)  Accu-chek Aviva Strp (Glucose blood) .... Test cbg's two times a day as directed  Hypertension Assessment/Plan:      The patient's hypertensive risk group is category C: Target organ damage and/or diabetes.  Today's blood pressure is 140/80.  Her blood pressure goal is < 130/80.  Patient Instructions: 1)  Please schedule a follow-up appointment in 2 months. 2)  HbgA1C prior to visit, ICD-9:250.82 3)  and CPX at the same time Prescriptions: ACCU-CHEK AVIVA  STRP (GLUCOSE BLOOD) test CBG's two times a day as directed  #100 x 6   Entered and Authorized by:   Stacie Glaze MD   Signed by:   Stacie Glaze MD on 11/14/2009   Method used:   Electronically to        [facility] [id number]* (retail)       [address]       [place], [place]  [address]       Ph: [phone]       Fax: [phone]   RxID:   [id number]

## 2010-11-24 NOTE — Letter (Signed)
Summary: Generic Letter  [facility]  [address] [place], [place] [address]   Phone: [phone]  Fax: [phone]    12/31/2009  Patricia Elliott [address] [place], [place]  [address]  Dear Ms. Domzalski,   This will confirm that you had a complete wellness exam with all appropriate screening blood work including lipids. This exam was on 12/31/2009.        Sincerely,   Darryll Capers MD

## 2010-11-26 NOTE — Assessment & Plan Note (Signed)
Summary: 3 month rov/njr   Vital Signs:  Patient profile:   55 year old female Height:      65 inches Weight:      178 pounds BMI:     29.73 Temp:     98.2 degrees F oral Pulse rate:   72 / minute Resp:     14 per minute BP sitting:   130 / 80  (left arm)  Vitals Entered By: Willy Eddy, LPN (October 06, 2010 10:20 AM) CC: roa labs, Hypertension Management Is Patient Diabetic? Yes Did you bring your meter with you today? Yes   Primary Care Threasa Kinch:  Stacie Glaze MD  CC:  roa labs and Hypertension Management.  History of Present Illness: the blood glucoses are under 100 and she feel stable the lipids are now at goal she recongizes the effect of diet and exercize  two months ago had a sharp pain in  her eye ( the right eye) she had seen opthamologist in june. no visial change the pain does not last   Hypertension History:      She denies headache, chest pain, palpitations, dyspnea with exertion, orthopnea, PND, peripheral edema, visual symptoms, neurologic problems, syncope, and side effects from treatment.        Positive major cardiovascular risk factors include female age 66 years old or older, diabetes, hyperlipidemia, and hypertension.  Negative major cardiovascular risk factors include negative family history for ischemic heart disease and non-tobacco-user status.        Further assessment for target organ damage reveals no history of ASHD, cardiac end-organ damage (CHF/LVH), stroke/TIA, peripheral vascular disease, renal insufficiency, or hypertensive retinopathy.     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) 4)  Preventive Health  Care  (ICD-V70.0) 5)  External Hemorrhoids  (ICD-455.3) 6)  Colonic Polyps  (ICD-211.3) 7)  Dermatophytosis, Nail  (ICD-110.1) 8)  Dysphagia, Pharyngeal Phase  (VWU-981.19) 9)  Hyperlipidemia  (ICD-272.4) 10)  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) 11)  Family History Diabetes 1st Degree Relative  (ICD-V18.0) 12)  Raynaud's Syndrome  (ICD-443.0) 13)  Hypertension  (ICD-401.9)  Current Problems (verified): 1)  Diab w/o Mention Comp Type Ii/uns Type Uncntrl  (ICD-250.02) 2)  Other Malignant Neoplasm Skin Site Unspecified  (ICD-173.9) 3)  Seborrheic Capitis  (ICD-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 One By Mouth Daily 2)  Prevacid 15 Mg Cpdr (Lansoprazole) .... Once Daily 3)  Vitamin D 2000 Unit Tabs (Cholecalciferol) .Marland Kitchen.. 1 Once Daily 4)  Clobetasol Propionate 0.05 % Foam (Clobetasol Propionate) .... Apply To Sclap As Directed 5)  Fish Oil Concentrate 1000 Mg Caps (Omega-3 Fatty Acids) .... One By Mouth  Two Times A Day or Two A Day 6)  Derma-Smoothe/fs Scalp  0.01 % Oil (Fluocinolone Acetonide) .... Apply To Scalp As Directed 7)  Janumet 50-500 Mg Tabs (Sitagliptin-Metformin Hcl) .... One By Mouth Two Times A Day 8)  Accu-Chek Aviva  Strp (Glucose Blood) .... Test Cbg's Two Times A Day As Directed 9)  Crestor 20 Mg Tabs (Rosuvastatin Calcium) .... One By Mouth Weekly  Current Medications (verified): 1)  Benicar Hct 20-12.5 Mg  Tabs (Olmesartan Medoxomil-Hctz) .... One By Mouth Daily 2)  Prevacid 15 Mg Cpdr (Lansoprazole) ....  Once Daily 3)  Vitamin D 2000 Unit Tabs (Cholecalciferol) .Marland Kitchen.. 1 Once Daily 4)  Fish Oil Concentrate 1000 Mg Caps (Omega-3 Fatty Acids) .... One By Mouth  Two Times A Day or Two A Day 5)  Derma-Smoothe/fs Scalp 0.01 % Oil (Fluocinolone Acetonide) .... Apply To Scalp As Directed 6)  Janumet 50-500 Mg Tabs (Sitagliptin-Metformin Hcl) .... One By Mouth Two Times A Day 7)  Accu-Chek Aviva  Strp (Glucose Blood) .... Test Cbg's Two Times A Day As Directed 8)  Crestor 20 Mg Tabs (Rosuvastatin Calcium) .... One By Mouth Weekly  Allergies (verified): No Known Drug Allergies  Past History:  Family History: Last updated: 05/18/2007 Family History Diabetes 1st degree relative Family History Hypertension  Social History: Last updated: 07/07/2007 Never Smoked Alcohol use-no Occupation: [employer] Married  Risk Factors: Caffeine Use: 0 (07/07/2007) Exercise: yes (07/07/2007)  Risk Factors: Smoking Status: never (10/06/2010) Passive Smoke Exposure: no (10/06/2010)  Past medical, surgical, family and social histories (including risk factors) reviewed, and no changes noted (except as noted below).  Past Medical History: Reviewed history from 12/29/2007 and no changes required. Hypertension RA retinitis pigmentosa Hyperlipidemia Diabetes mellitus, type II gerd arthritis  Past Surgical History: Reviewed history from 07/07/2007 and no changes required. Hysterectomy Caesarean section  x 2  Family History: Reviewed history from 05/18/2007 and no changes required. Family History Diabetes 1st degree relative Family History Hypertension  Social History: Reviewed history from 07/07/2007 and no changes required. Never Smoked Alcohol use-no Occupation: [employer] Married  Review of Systems       The patient complains of headaches.  The patient denies anorexia, fever, weight loss, weight gain, vision loss, decreased hearing, hoarseness, chest pain, syncope, dyspnea on exertion, peripheral edema,  prolonged cough, hemoptysis, abdominal pain, melena, hematochezia, severe indigestion/heartburn, hematuria, incontinence, genital sores, muscle weakness, suspicious skin lesions, transient blindness, difficulty walking, depression, unusual weight change, abnormal bleeding, enlarged lymph nodes, angioedema, breast masses, and testicular masses.    Physical Exam  General:  Well-developed,well-nourished,in no acute distress; alert,appropriate and cooperative throughout examination Head:  normocephalic, focal alopecia, and scaling.   Eyes:  pupils equal and pupils round.  corneas and lenses clear, no injection, no optic disk abnormalities, and no retinal abnormalitiies.   Ears:  R canal inflamed and R Canal drainage.   Nose:  no external deformity and no nasal discharge.   Mouth:  Oral mucosa and oropharynx without lesions or exudates.  Teeth in good repair. Neck:  No deformities, masses, or tenderness noted. Lungs:  Normal respiratory effort, chest expands symmetrically. Lungs are clear to auscultation, no crackles or wheezes. Heart:  normal rate and regular rhythm.   Abdomen:  soft and non-tender.   Msk:  normal ROM, no joint tenderness, and no joint swelling.   Extremities:  No clubbing, cyanosis, edema, or deformity noted with normal full range of motion of all joints.   Neurologic:  No cranial nerve deficits noted. Station and gait are normal. Plantar reflexes are down-going bilaterally. DTRs are symmetrical  throughout. Sensory, motor and coordinative functions appear intact.   Impression & Recommendations:  Problem # 1:  DIAB W/O MENTION COMP TYPE II/UNS TYPE UNCNTRL (ICD-250.02)  Her updated medication list for this problem includes:    Benicar Hct 20-12.5 Mg Tabs (Olmesartan medoxomil-hctz) ..... One by mouth daily    Janumet 50-500 Mg Tabs (Sitagliptin-metformin hcl) ..... One by mouth two times a day  Labs Reviewed: Creat: 0.8 (12/24/2009)    Reviewed HgBA1c results: 6.0  (07/02/2010)  6.2 (12/24/2009)  Problem # 2:  HYPERLIPIDEMIA (ICD-272.4)  Her updated medication list for this problem includes:    Crestor 20 Mg Tabs (Rosuvastatin calcium) ..... One by mouth weekly  Labs Reviewed: SGOT: 14 (09/29/2010)   SGPT: 13 (09/29/2010)  Lipid Goals: Chol Goal: 200 (07/07/2007)   HDL Goal: 40 (07/07/2007)   LDL Goal: 100 (07/07/2007)   TG Goal: 150 (07/07/2007)  10 Yr Risk Heart Disease: 17 % Prior 10 Yr Risk Heart Disease: Not enough information (08/14/2009)   HDL:45.70 (09/29/2010), 42.70 (07/02/2010)  LDL:126 (09/29/2010), DEL (12/24/2008)  Chol:187 (09/29/2010), 204 (07/02/2010)  Trig:77.0 (09/29/2010), 82.0 (07/02/2010)  Problem # 3:  HYPERTENSION (ICD-401.9)  Her updated medication list for this problem includes:    Benicar Hct 20-12.5 Mg Tabs (Olmesartan medoxomil-hctz) ..... One by mouth daily  BP today: 130/80 Prior BP: 126/74 (07/14/2010)  10 Yr Risk Heart Disease: 17 % Prior 10 Yr Risk Heart Disease: Not enough information (08/14/2009)  Labs Reviewed: K+: 3.9 (12/24/2009) Creat: : 0.8 (12/24/2009)   Chol: 187 (09/29/2010)   HDL: 45.70 (09/29/2010)   LDL: 126 (09/29/2010)   TG: 77.0 (09/29/2010)  Complete Medication List: 1)  Benicar Hct 20-12.5 Mg Tabs (Olmesartan medoxomil-hctz) .... One by mouth daily 2)  Prevacid 15 Mg Cpdr (Lansoprazole) .... Once daily 3)  Vitamin D 2000 Unit Tabs (Cholecalciferol) .Marland Kitchen.. 1 once daily 4)  Fish Oil Concentrate 1000 Mg Caps (Omega-3 fatty acids) .... One by mouth  two times a day or two a day 5)  Derma-smoothe/fs Scalp 0.01 % Oil (Fluocinolone acetonide) .... Apply to scalp as directed 6)  Janumet 50-500 Mg Tabs (Sitagliptin-metformin hcl) .... One by mouth two times a day 7)  Accu-chek Aviva Strp (Glucose blood) .... Test cbg's two times a day as directed 8)  Crestor 20 Mg Tabs (Rosuvastatin calcium) .... One by mouth weekly  Hypertension Assessment/Plan:      The patient's hypertensive risk group is  category C: Target organ damage and/or diabetes.  Her calculated 10 year risk of coronary heart disease is 17 %.  Today's blood pressure is 130/80.  Her blood pressure goal is < 130/80.  Patient Instructions: 1)  Please schedule a follow-up appointment  for CPX in april 2)  HbgA1C prior to visit, ICD-9:250.00 Prescriptions: ACCU-CHEK AVIVA  STRP (GLUCOSE BLOOD) test CBG's two times a day as directed  #100 x 6   Entered by:   Willy Eddy, LPN   Authorized by:   Stacie Glaze MD   Signed by:   Willy Eddy, LPN on 16/07/9603   Method used:   Electronically to        [facility] [phone]* (retail)       [address]       [place], [place]  [address]       Ph: [phone]       Fax: [phone]   RxID:   [id number]    Orders Added: 1)  Est. Patient Level IV [53664]

## 2011-01-12 ENCOUNTER — Other Ambulatory Visit (INDEPENDENT_AMBULATORY_CARE_PROVIDER_SITE_OTHER): Payer: 59

## 2011-01-12 DIAGNOSIS — E785 Hyperlipidemia, unspecified: Secondary | ICD-10-CM

## 2011-01-12 DIAGNOSIS — Z0000 Encounter for general adult medical examination without abnormal findings: Secondary | ICD-10-CM

## 2011-01-12 LAB — MICROALBUMIN / CREATININE URINE RATIO
Creatinine,U: 126.9 mg/dL
Microalb, Ur: 0.5 mg/dL (ref 0.0–1.9)

## 2011-01-12 LAB — CBC WITH DIFFERENTIAL/PLATELET
Eosinophils Relative: 1.9 % (ref 0.0–5.0)
Monocytes Relative: 7.9 % (ref 3.0–12.0)
Neutrophils Relative %: 48.2 % (ref 43.0–77.0)
Platelets: 213 10*3/uL (ref 150.0–400.0)
WBC: 4.8 10*3/uL (ref 4.5–10.5)

## 2011-01-12 LAB — POCT URINALYSIS DIPSTICK
Blood, UA: NEGATIVE
Glucose, UA: NEGATIVE
Protein, UA: NEGATIVE
Spec Grav, UA: 1.015
Urobilinogen, UA: 0.2

## 2011-01-12 LAB — BASIC METABOLIC PANEL
BUN: 11 mg/dL (ref 6–23)
Creatinine, Ser: 0.8 mg/dL (ref 0.4–1.2)
GFR: 98.2 mL/min (ref >60.00)
Potassium: 4.2 mEq/L (ref 3.5–5.1)

## 2011-01-12 LAB — HEPATIC FUNCTION PANEL
ALT: 18 U/L (ref 0–35)
AST: 15 U/L (ref 0–37)
Bilirubin, Direct: 0 mg/dL (ref 0.0–0.3)
Total Bilirubin: 0.5 mg/dL (ref 0.3–1.2)

## 2011-01-12 LAB — LIPID PANEL: Total CHOL/HDL Ratio: 5

## 2011-01-12 LAB — LDL CHOLESTEROL, DIRECT: Direct LDL: 162.8 mg/dL

## 2011-01-12 LAB — TSH: TSH: 1.7 u[IU]/mL (ref 0.35–5.50)

## 2011-01-15 ENCOUNTER — Encounter: Payer: Self-pay | Admitting: Internal Medicine

## 2011-01-19 ENCOUNTER — Encounter: Payer: Self-pay | Admitting: Internal Medicine

## 2011-01-19 ENCOUNTER — Other Ambulatory Visit: Payer: Self-pay

## 2011-01-19 ENCOUNTER — Ambulatory Visit: Payer: 59 | Admitting: Internal Medicine

## 2011-01-19 VITALS — BP 130/80 | HR 72 | Temp 98.2°F | Resp 14 | Ht 63.0 in | Wt 180.0 lb

## 2011-01-19 DIAGNOSIS — Z0000 Encounter for general adult medical examination without abnormal findings: Secondary | ICD-10-CM

## 2011-01-19 DIAGNOSIS — E785 Hyperlipidemia, unspecified: Secondary | ICD-10-CM

## 2011-01-19 DIAGNOSIS — I10 Essential (primary) hypertension: Secondary | ICD-10-CM

## 2011-01-19 DIAGNOSIS — E119 Type 2 diabetes mellitus without complications: Secondary | ICD-10-CM

## 2011-01-19 MED ORDER — ROSUVASTATIN CALCIUM 20 MG PO TABS: 20.0000 mg | ORAL_TABLET | ORAL | Status: DC

## 2011-01-26 ENCOUNTER — Encounter: Payer: Self-pay | Admitting: Internal Medicine

## 2011-02-11 ENCOUNTER — Telehealth: Payer: Self-pay | Admitting: *Deleted

## 2011-02-11 DIAGNOSIS — D229 Melanocytic nevi, unspecified: Secondary | ICD-10-CM

## 2011-02-11 NOTE — Telephone Encounter (Signed)
Has a suspicious m mole on abd that needs that needs to be looked at --try dr Terri Piedra

## 2011-02-11 NOTE — Telephone Encounter (Signed)
Pt would like to talk to [facility] about a Dermatology referral.

## 2011-02-11 NOTE — Telephone Encounter (Signed)
referral to terri for suspicious mole on abd

## 2011-02-15 ENCOUNTER — Telehealth: Payer: Self-pay | Admitting: *Deleted

## 2011-02-15 NOTE — Telephone Encounter (Signed)
Pt is asking to speak to [facility] about a med that was called in in February, and is confused on dosage??

## 2011-02-15 NOTE — Telephone Encounter (Signed)
Pt had been taking50/10000 bid of janumet until June 2011-- was changed to 50-500 bid then and pt states the color of pill is different- talked with pharmacy and gave them order for 5-0/500 bid- encouraged pt to go for referral to endocrinologist to adjust diabetes medicine,but doesn't want to do that now doe to going to dermatologist for m ole removal

## 2011-02-19 ENCOUNTER — Other Ambulatory Visit: Payer: Self-pay | Admitting: Obstetrics and Gynecology

## 2011-03-09 NOTE — Assessment & Plan Note (Signed)
[facility]                            CARDIOLOGY OFFICE NOTE   MELISSA, TOMASELLI                     MRN:          [id number]  DATE:09/12/2007                            DOB:          1955-11-30    PRIMARY CARE PHYSICIAN:  Stacie Glaze, MD   REASON FOR VISIT:  Followup cardiac testing.   HISTORY OF PRESENT ILLNESS:  I saw Ms. Baltes back in late October. She  presented at that time following an episode of chest pain with some  dyspnea on exertion. This was also complicated by reflux with dysphagia  which was actually improving on proton pump inhibitor therapy. I  referred her for basic cardiac risk stratification as she did have  hypertension and a mildly elevated LDL cholesterol. Her ejection  fraction was normal at 60% with no major valvular abnormalities. She had  a small pericardial effusion posterior to the heart, likely of no major  clinical significance. Her Myoview revealed no diagnostic ST segment  changes with an ejection fraction of 73%. She had a fixed anterior  defect likely consistent with soft-tissue attenuation, but no frank  ischemia. Symptomatically, Ms. Severin denies having any recurrent chest  pains since I last saw her. I reviewed her studies with her today and  generally recommended risk factor modification.   ALLERGIES:  No known drug allergies.   PRESENT MEDICATIONS:  1. Calcium supplements.  2. Vitamin B12.  3. Folic acid 1 mg p.o. daily.  4. Benicar 20 mg p.o. daily.  5. Aspirin 81 mg p.o. daily.  6. Nexium 40 mg p.o. daily.   REVIEW OF SYSTEMS:  As described in History of Present Illness.   PHYSICAL EXAMINATION:  Blood pressure 156/90, heart rate 73, weight 203  pounds. The patient is comfortable in no acute distress. Otherwise, no  major changes in her examination as outlined previously.   IMPRESSIONS AND RECOMMENDATIONS:  1. Episode of chest discomfort, resolved without obvious recurrence.      Cardiac  testing was overall reassuring with normal ejection      fraction. No major valvular abnormalities and no frank ischemia.      Would recommend basic risk factor modification including management      of hypertension and hyperlipidemia. She will plan to followup with      Dr. Lovell Sheehan and Dr. Christella Hartigan. She has reported an improvement in her      symptoms of reflux and dysphagia on proton pump inhibitor therapy      and I wonder if some of her chest pain may have been related to      this.  2. Cardiology followup will be p.r.n.     Jonelle Sidle, MD  Electronically Signed    SGM/MedQ  DD: 09/12/2007  DT: 09/12/2007  Job #: [id number]   cc:   Stacie Glaze, MD  Rachael Fee, MD

## 2011-03-09 NOTE — Assessment & Plan Note (Signed)
[facility]                            CARDIOLOGY OFFICE NOTE   SUSANNAH, CARBIN                     MRN:          [id number]  DATE:08/21/2007                            DOB:          Nov 11, 1955    PRIMARY CARE PHYSICIAN:  Dr. Darryll Capers.   REASON FOR CONSULTATION:  Chest pain.   HISTORY OF PRESENT ILLNESS:  Ms. Coonrod is a 55 year old woman with a  history of hypertension as well as gastroesophageal reflux disease and  dysphagia. She reports having improved reflux symptoms with the addition  of a proton pump inhibitor and is being followed by Dr. Christella Hartigan. She is  referred more specifically with a history of chest pain which she  describes as a crushing sensation that occurred at rest while she was  lying in bed back in September. She reports feeling very short of breath  with this episode. The symptoms lasted for at least 10 minutes and  eventually just subsided spontaneously. She has had no subsequent  symptoms similar to this although does note that she seems to be more  short of breath with activity, specifically climbing stairs. She  otherwise has no exertional chest pain. She denies having any history of  cardiovascular disease or prior stress testing. Her electrocardiogram  today in the office shows sinus rhythm with right atrial enlargement,  small R primes in leads V1 and V2 and nonspecific T wave changes.   ALLERGIES:  No known drug allergies.   CURRENT MEDICATIONS:  1. Vitamin B12 monthly.  2. Folic acid 1 mg p.o. daily.  3. Benicar 20 mg p.o. daily.  4. Aspirin 81 mg p.o. daily.  5. Nexium 40 mg p.o. daily.  6. Calcium supplements.   PAST MEDICAL HISTORY:  As outlined above. Recent LDL was 134 back in  June. The patient reports previous hysterectomy in 1989. Also a history  of colonoscopy with removal of polypoid lesion from the transverse colon  that was not found to be an adenoma. Also history of arthritis.   FAMILY  HISTORY:  Reviewed. Noncontributory for premature cardiovascular  disease. The patient states that her father died at age 64 with kidney  disease.   REVIEW OF SYSTEMS:  As described in the history of present illness. She  complains of hand discomfort and swelling at times and has been told she  has Raynaud's disease. Some constipation noted at times.   SOCIAL HISTORY:  The patient is married, she has 2 children, denies any  tobacco or alcohol use. She does walk sometimes for exercise. She works  as an Environmental health practitioner.   PHYSICAL EXAMINATION:  Blood pressure is 148/90, heart rate is 78,  weight is 203 pounds. The patient is comfortable and in no acute  distress.  HEENT:  Conjunctiva is normal. Pharynx is clear.  NECK:  Supple. No elevated jugular venous pressure, no loud bruits, no  thyromegaly is noted.  LUNGS:  Clear without labored breathing at rest.  CARDIAC:  Reveals a regular rate and rhythm. No S3 gallop, no  pericardial rub.  ABDOMEN:  Soft, nontender, normal active bowel sounds.  EXTREMITIES:  Exhibit no significant pitting edema. Distal pulses are  2+.  SKIN:  Warm and dry.  MUSCULOSKELETAL:  No kyphosis is noted.  NEUROPSYCHIATRIC:  The patient is alert and oriented x3. Affect is  normal.   IMPRESSION/RECOMMENDATIONS:  1. Episode of chest discomfort at rest associated with shortness of      breath back in September. Since then, the patient has not had      recurrence of chest pain however has been more breathless with      activity. Her electrocardiogram is nonspecific today. She has      hypertension and mildly elevated LDL cholesterol but no smoking      history and no family history of premature cardiovascular disease.      She has had no prior cardiac risk stratification. I agree that      additional testing is reasonable and I discussed with her both the      possibilities of a diagnostic cardiac catheterization versus a      myocardial perfusion study.  At this point, she prefers a      noninvasive approach. I will therefore schedule an exercise Myoview      as well as an echocardiogram. She will return to the office to      discuss these results and we can proceed from there.  2. Further plans to follow.     Jonelle Sidle, MD  Electronically Signed    SGM/MedQ  DD: 08/21/2007  DT: 08/22/2007  Job #: [id number]   cc:   Stacie Glaze, MD  Rachael Fee, MD

## 2011-03-09 NOTE — Assessment & Plan Note (Signed)
[facility]                         GASTROENTEROLOGY OFFICE NOTE   Patricia, Elliott                     MRN:          [id number]  DATE:08/08/2007                            DOB:          Mar 26, 1956    Dr.  Lovell Sheehan asked me to evaluate Patricia Elliott in consultation regarding  recent dysphagia.   HISTORY OF PRESENT ILLNESS:  Patricia Elliott is a pleasant 55 year old woman  whom I actually met on a direct colonoscopy for a routine screening July  of 2008. She removed one polypoid lesion from her transverse colon. This  was not an adenoma and so she is already scheduled for a repeat  colonoscopy in ten years for routine screening purposes. More recently,  she has had a single dysphagia incident. This occurred approximately one  month ago when chewing bread. She describes the bread catching  approximately mid sternum. It stayed for approximately 15 minutes. She  drank some water and it slowly became dislodged. This happened once one  month ago. She also can recall about five years ago a similar incident,  but none in between. She also describes rare GERD symptoms, some pyrosis  and indigestion approximately happens twice a week. About a month ago  after this dysphagia event, I gave her Nexium samples. She takes those  with her lunch meal and since then she has had no return of her  indigestion and also has not had any more dysphagia.   Also, interestingly, she describes a chest pain episode about two weeks  ago. This was while she was lying down. She describes it as a chest  pressure as if her chest was caving in associated with some shortness of  breath that lasted approximately half an hour. She had not been eating  before then. She did not think too much of it. Has not sought medical  attention for it yet. She has not had any recurrence of chest pains. She  has no shortness of breath.   REVIEW OF SYSTEMS:  Is essentially normal and is available on our  nursing intake sheet.   PAST MEDICAL HISTORY:  1. Hypertension.  2. Arthritis.  3. Status post hysterectomy.   CURRENT MEDICATIONS:  1. Vitamin B12.  2. Folic acid.  3. Benicar.  4. Baby aspirin.  5. Nexium.  6. Calcium.   ALLERGIES:  No known drug allergies.   SOCIAL HISTORY:  Married with two children. Works as an Recruitment consultant for [employer]. Nonsmoker and nondrinker. Does not drink much  caffeine, chocolate or peppermints.   FAMILY HISTORY:  No colon cancer, colon polyps in the family.   PHYSICAL EXAMINATION:  Height 5 feet, 4 inches. Weight is 204 pounds,  blood pressure 122/78, pulse 64.  CONSTITUTIONAL: Generally well-appearing.  NEUROLOGIC: Alert and oriented x3.  EYES: Extraocular movements intact.  MOUTH: Oropharynx moist. No lesions.  NECK: Supple. No lymphadenopathy.  CARDIOVASCULAR: HEART: Regular rate and rhythm.  LUNGS:  Clear to auscultation bilaterally.  ABDOMEN: Soft and nontender, nondistended. Normal bowel sounds.  EXTREMITIES: No lower extremity edema.  SKIN: No rashes or lesions on visible extremities.   ASSESSMENT/PLAN:  A 55 year old woman with recent dysphagia, chronic  indigestion gastroesophageal reflux disease like symptoms, recent chest  pain.   First, her chest pain was a bit concerning to me. She did have some  shortness of breath. It does not seem related to anything that she had  been eating as she did not eat anything before that chest pain incident.  I will refer her for a cardiology evaluation prior to any further GI  workup. For now, I will call in a prescription for a proton pump  inhibitor. I have given her a gastroesophageal reflux disease handout  and instructed her to take her proton pump inhibitor 20-30 minutes prior  to her lunch meal as that is the most reliable meal of the day. After  her cardiology workup I would like to proceed with an EGD. She will call  at that time. I suspect she has a peptic related dysphagia,  possibly a  small Schatzki's ring. This dysphagia happened only twice in the past  five years and so I think it is certainly something that could wait  until after her cardiac evaluation.     Patricia Fee, MD  Electronically Signed    DPJ/MedQ  DD: 08/08/2007  DT: 08/08/2007  Job #: [id number]   cc:   Stacie Glaze, MD

## 2011-03-09 NOTE — Assessment & Plan Note (Signed)
[facility]                         GASTROENTEROLOGY OFFICE NOTE   RHYA, SHAN                     MRN:          [id number]  DATE:10/23/2007                            DOB:          [birth year]    PRIMARY CARE PHYSICIAN:  Dr. Lovell Sheehan.   CARDIOLOGIST:  Nona Dell, MD.   GI PROBLEM LIST:  1. Routine risk for colorectal cancer.  Underwent colonoscopy July,      2008, no tubular adenomas.  Next colonoscopy July, 2018.  2. GERD and likely GERD-related dysphagia.  GERD symptoms improved on      once daily PPI taken 20-30 minutes prior to her breakfast meal.  No      EGD as yet (this was postponed due to some chest pains which have      subsequently been evaluated by Dr. Diona Browner).   INTERVAL HISTORY:  I last saw Ms. Grogan about 2 months ago.  At that  time we discussed proceeding with an EGD to evaluate her intermittent  dysphagia.  I suspected at the time that she has a benign cause of her  dysphagia.  The EGD was postponed because she was having atypical chest  pains and we thought it was wise to have this evaluated by cardiologist  before any elective procedures.  She has since done that and Dr.  Diona Browner has informed her that she does not have signs of significant  coronary artery disease.  Her GERD symptoms have definitely improved,  she has not had dysphagia again.   CURRENT MEDICINES:  Vitamin B12, folate, Benicar, aspirin, Calcium,  Protonix.   PHYSICAL EXAM:  Weight 209 pounds, which is up 5 pounds since her last  visit, blood pressure 136/80, pulse 70.  CONSTITUTIONAL:  Generally well-appearing.  ABDOMEN:  Soft, nontender, nondistended, normal bowel sounds.  EXTREMITIES:  No lower extremity edema.   ASSESSMENT AND PLAN:  A 55 year old woman with gastroesophageal reflux  disease, intermittent dysphagia (likely benign etiology).   We will arrange for Ms. Corl to have an EGD performed at her soonest  convenience.  She is  having her insurance plans changed and this will be  effective the first of the year and so she wants to wait to schedule  this procedure after the change is done to avoid paperwork hassles.  I  gave her my nurse's name and direct line and so when she is ready to  schedule the EGD she can contact us directly.     Rachael Fee, MD  Electronically Signed    DPJ/MedQ  DD: 10/23/2007  DT: 10/23/2007  Job #: [id number]   cc:   Stacie Glaze, MD  Jonelle Sidle, MD

## 2011-04-23 ENCOUNTER — Other Ambulatory Visit: Payer: Self-pay | Admitting: *Deleted

## 2011-04-23 MED ORDER — SAXAGLIPTIN-METFORMIN ER 5-1000 MG PO TB24: 1.0000 | ORAL_TABLET | Freq: Every day | ORAL | Status: DC

## 2011-05-31 ENCOUNTER — Telehealth: Payer: Self-pay | Admitting: Internal Medicine

## 2011-05-31 MED ORDER — OLMESARTAN MEDOXOMIL-HCTZ 20-12.5 MG PO TABS: 1.0000 | ORAL_TABLET | Freq: Every day | ORAL | Status: DC

## 2011-05-31 NOTE — Telephone Encounter (Signed)
Pt would like a refill of her Benicar HCT called in to [facility] on [address].

## 2011-06-16 ENCOUNTER — Other Ambulatory Visit: Payer: 59

## 2011-06-18 ENCOUNTER — Other Ambulatory Visit (INDEPENDENT_AMBULATORY_CARE_PROVIDER_SITE_OTHER): Payer: 59

## 2011-06-18 DIAGNOSIS — E119 Type 2 diabetes mellitus without complications: Secondary | ICD-10-CM

## 2011-06-18 DIAGNOSIS — E785 Hyperlipidemia, unspecified: Secondary | ICD-10-CM

## 2011-06-18 DIAGNOSIS — T887XXA Unspecified adverse effect of drug or medicament, initial encounter: Secondary | ICD-10-CM

## 2011-06-18 LAB — BASIC METABOLIC PANEL
CO2: 29 mEq/L (ref 19–32)
Calcium: 9.3 mg/dL (ref 8.4–10.5)
Creatinine, Ser: 0.8 mg/dL (ref 0.4–1.2)
GFR: 96.62 mL/min (ref >60.00)

## 2011-06-18 LAB — LIPID PANEL
HDL: 63.1 mg/dL (ref >39.00)
Total CHOL/HDL Ratio: 3
Triglycerides: 69 mg/dL (ref 0.0–149.0)
VLDL: 13.8 mg/dL (ref 0.0–40.0)

## 2011-06-23 ENCOUNTER — Encounter: Payer: Self-pay | Admitting: Internal Medicine

## 2011-06-23 ENCOUNTER — Ambulatory Visit (INDEPENDENT_AMBULATORY_CARE_PROVIDER_SITE_OTHER): Payer: 59 | Admitting: Internal Medicine

## 2011-06-23 DIAGNOSIS — E119 Type 2 diabetes mellitus without complications: Secondary | ICD-10-CM

## 2011-06-23 DIAGNOSIS — L900 Lichen sclerosus et atrophicus: Secondary | ICD-10-CM

## 2011-06-23 DIAGNOSIS — M542 Cervicalgia: Secondary | ICD-10-CM

## 2011-06-23 DIAGNOSIS — L940 Localized scleroderma [morphea]: Secondary | ICD-10-CM

## 2011-06-23 NOTE — Patient Instructions (Signed)
Stop the fish oil but continue the crestor

## 2011-06-23 NOTE — Progress Notes (Signed)
Subjective:    Patient ID: Patricia Elliott, female    DOB: [birth year], 55 y.o.   MRN: [id number]  HPI Diagnosed with lichen chronicus sclerosis and has been  On topical . DM moderately well controlled but weight has increased. Good results with the cholesterol Patient has neck spasm and trapezius muscle on the prior left side.  She reports no known injury she does sit at a computer she does read books in positions that may induce muscle spasms and she does say that cold air has been going on her neck recently.  She states the pain is more subacute and chronic than acute due to a identifiable injury    Review of Systems  Constitutional: Negative for activity change, appetite change and fatigue.  HENT: Negative for ear pain, congestion, neck pain, postnasal drip and sinus pressure.   Eyes: Negative for redness and visual disturbance.  Respiratory: Negative for cough, shortness of breath and wheezing.   Gastrointestinal: Negative for abdominal pain and abdominal distention.  Genitourinary: Negative for dysuria, frequency and menstrual problem.  Musculoskeletal: Positive for back pain and arthralgias. Negative for myalgias and joint swelling.  Skin: Negative for rash and wound.  Neurological: Negative for dizziness, weakness and headaches.  Hematological: Negative for adenopathy. Does not bruise/bleed easily.  Psychiatric/Behavioral: Negative for sleep disturbance and decreased concentration.   Past Medical History  Diagnosis Date  . Hypertension   . Retinitis pigmentosa   . Hyperlipidemia   . Diabetes mellitus   . GERD (gastroesophageal reflux disease)   . Arthritis    Past Surgical History  Procedure Date  . Cesarean section   . Abdominal hysterectomy     reports that she has never smoked. She does not have any smokeless tobacco history on file. She reports that she does not drink alcohol or use illicit drugs. family history includes Diabetes in an unspecified family member  and Hypertension in an unspecified family member. No Known Allergies c   Objective:   Physical Exapm  Nursing note and vitals reviewed. Constitutional: She is oriented to person, place, and time. She appears well-developed and well-nourished. No distress.  HENT:  Head: Normocephalic and atraumatic.  Right Ear: External ear normal.  Left Ear: External ear normal.  Nose: Nose normal.  Mouth/Throat: Oropharynx is clear and moist.  Eyes: Conjunctivae and EOM are normal. Pupils are equal, round, and reactive to light.  Neck: Normal range of motion. Neck supple. No JVD present. No tracheal deviation present. No thyromegaly present.  Cardiovascular: Normal rate, regular rhythm, normal heart sounds and intact distal pulses.   No murmur heard. Pulmonary/Chest: Effort normal and breath sounds normal. She has no wheezes. She exhibits no tenderness.  Abdominal: Soft. Bowel sounds are normal.  Musculoskeletal: She exhibits tenderness. She exhibits no edema.  Lymphadenopathy:    She has no cervical adenopathy.  Neurological: She is alert and oriented to person, place, and time. She has normal reflexes. No cranial nerve deficit.  Skin: Skin is warm and dry. She is not diaphoretic.  Psychiatric: She has a normal mood and affect. Her behavior is normal.          Assessment & Plan:  Stop  The fish oil as it may contribute to the chronic low-grade itching.  Continue all other current medications including Crestor which is allowed you to achieve her best ever cholesterol ratio of good and bad cholesterol continue the topical steroids per the dermatologist in followup as indicated continue the diabetic medicine as ordered.  He wants he is stable at the current level of 6.1 blood sugars are within range exercise and weight loss is urged and dietary patterns were addressed and reviewed with the patient today

## 2011-06-29 ENCOUNTER — Other Ambulatory Visit: Payer: Self-pay | Admitting: Obstetrics and Gynecology

## 2011-06-29 DIAGNOSIS — Z1231 Encounter for screening mammogram for malignant neoplasm of breast: Secondary | ICD-10-CM

## 2011-07-07 ENCOUNTER — Ambulatory Visit
Admission: RE | Admit: 2011-07-07 | Discharge: 2011-07-07 | Disposition: A | Discharge status: Home / Self Care | Payer: 59 | Source: Ambulatory Visit | Attending: Obstetrics and Gynecology | Admitting: Obstetrics and Gynecology

## 2011-07-07 DIAGNOSIS — Z1231 Encounter for screening mammogram for malignant neoplasm of breast: Secondary | ICD-10-CM

## 2011-10-22 ENCOUNTER — Other Ambulatory Visit (INDEPENDENT_AMBULATORY_CARE_PROVIDER_SITE_OTHER): Payer: 59

## 2011-10-22 DIAGNOSIS — E119 Type 2 diabetes mellitus without complications: Secondary | ICD-10-CM

## 2011-10-27 ENCOUNTER — Encounter: Payer: Self-pay | Admitting: Internal Medicine

## 2011-10-27 ENCOUNTER — Ambulatory Visit (INDEPENDENT_AMBULATORY_CARE_PROVIDER_SITE_OTHER): Admitting: Internal Medicine

## 2011-10-27 VITALS — BP 120/72 | HR 72 | Temp 98.3°F | Resp 16 | Ht 63.0 in | Wt 188.0 lb

## 2011-10-27 DIAGNOSIS — E119 Type 2 diabetes mellitus without complications: Secondary | ICD-10-CM

## 2011-10-27 DIAGNOSIS — L900 Lichen sclerosus et atrophicus: Secondary | ICD-10-CM

## 2011-10-27 DIAGNOSIS — I10 Essential (primary) hypertension: Secondary | ICD-10-CM

## 2011-10-27 DIAGNOSIS — L940 Localized scleroderma [morphea]: Secondary | ICD-10-CM

## 2011-10-27 NOTE — Patient Instructions (Signed)
The patient is instructed to continue all medications as prescribed. Schedule followup with check out clerk upon leaving the clinic  

## 2011-10-27 NOTE — Progress Notes (Signed)
Subjective:    Patient ID: Patricia Elliott, female    DOB: 15-Nov-1955, 56 y.o.   MRN: [id number]  HPI Follow up for DM had a1c drawn before visit with stable result Follow up for blood pressure Weight is up, discussed diet and exercise. Has RP and vision is stable. Has chronic hair loss As seen a dermatologist for chronic lichen sclerosis and has failed multiple topical agents.  The dermatologist has discussed a referral to [facility] for further evaluation and we have made a referral today  Review of Systems  Constitutional: Negative for activity change, appetite change and fatigue.  HENT: Negative for ear pain, congestion, neck pain, postnasal drip and sinus pressure.   Eyes: Negative for redness and visual disturbance.  Respiratory: Negative for cough, shortness of breath and wheezing.   Gastrointestinal: Negative for abdominal pain and abdominal distention.  Genitourinary: Negative for dysuria, frequency and menstrual problem.  Musculoskeletal: Negative for myalgias, joint swelling and arthralgias.  Skin: Negative for rash and wound.  Neurological: Negative for dizziness, weakness and headaches.  Hematological: Negative for adenopathy. Does not bruise/bleed easily.  Psychiatric/Behavioral: Negative for sleep disturbance and decreased concentration.   Past Medical History  Diagnosis Date  . Hypertension   . Retinitis pigmentosa   . Hyperlipidemia   . Diabetes mellitus   . GERD (gastroesophageal reflux disease)   . Arthritis     History   Social History  . Marital Status: Married    Spouse Name: N/A    Number of Children: N/A  . Years of Education: N/A   Occupational History  . Not on file.   Social History Main Topics  . Smoking status: Never Smoker   . Smokeless tobacco: Not on file  . Alcohol Use: No  . Drug Use: No  . Sexually Active: Not on file   Other Topics Concern  . Not on file   Social History Narrative  . No narrative on file    Past Surgical  History  Procedure Date  . Cesarean section   . Abdominal hysterectomy     Family History  Problem Relation Age of Onset  . Diabetes    . Hypertension      No Known Allergies  Current Outpatient Prescriptions on File Prior to Visit  Medication Sig Dispense Refill  . Cholecalciferol (VITAMIN D) 2000 UNITS CAPS Take 1 capsule by mouth.        . clobetasol (TEMOVATE) 0.05 % cream Apply 1 application topically 2 (two) times daily.        . fluocinolone (VANOS) 0.01 % cream Apply topically 2 (two) times daily.        Marland Kitchen glucose blood test strip 1 each by Other route as needed. Use as instructed       . lansoprazole (PREVACID) 15 MG capsule Take 15 mg by mouth daily.        Marland Kitchen olmesartan-hydrochlorothiazide (BENICAR HCT) 20-12.5 MG per tablet Take 1 tablet by mouth daily.  30 tablet  6  . rosuvastatin (CRESTOR) 20 MG tablet Take 1 tablet (20 mg total) by mouth as directed. Change to twice a week Monday and Friday      . Saxagliptin-Metformin (KOMBIGLYZE XR) 02-999 MG TB24 Take 1 tablet by mouth daily.  30 tablet  6    BP 120/72  Pulse 72  Temp 98.3 F (36.8 C)  Resp 16  Ht 5\' 3"  (1.6 m)  Wt 188 lb (85.276 kg)  BMI 33.30 kg/m2  Objective:   Physical Exam  Nursing note reviewed. Constitutional: She is oriented to person, place, and time. She appears well-developed and well-nourished. No distress.  HENT:  Head: Normocephalic and atraumatic.  Right Ear: External ear normal.  Left Ear: External ear normal.  Nose: Nose normal.  Mouth/Throat: Oropharynx is clear and moist.  Eyes: Conjunctivae and EOM are normal. Pupils are equal, round, and reactive to light.  Neck: Normal range of motion. Neck supple. No JVD present. No tracheal deviation present. No thyromegaly present.  Cardiovascular: Normal rate, regular rhythm, normal heart sounds and intact distal pulses.   No murmur heard. Pulmonary/Chest: Effort normal and breath sounds normal. She has no wheezes. She exhibits no  tenderness.  Abdominal: Soft. Bowel sounds are normal.  Musculoskeletal: Normal range of motion. She exhibits no edema and no tenderness.  Lymphadenopathy:    She has no cervical adenopathy.  Neurological: She is alert and oriented to person, place, and time. She has normal reflexes. No cranial nerve deficit.  Skin: Skin is warm and dry. She is not diaphoretic.  Psychiatric: She has a normal mood and affect. Her behavior is normal.          Assessment & Plan:  Patient is seen for followup of diabetes with a stable hemoglobin A1c.  The patient's last hemoglobin A1c was 6.1. Her blood pressure has been stable on her current medications.  Her retinitis pigmentosa is stable and she is evaluated by ophthalmology.  She has chronic lichen sclerosis and has failed topical therapy so we are referring her to [facility] for further evaluation.

## 2011-11-22 DIAGNOSIS — L659 Nonscarring hair loss, unspecified: Secondary | ICD-10-CM | POA: Insufficient documentation

## 2011-11-22 DIAGNOSIS — L989 Disorder of the skin and subcutaneous tissue, unspecified: Secondary | ICD-10-CM | POA: Insufficient documentation

## 2011-11-26 ENCOUNTER — Telehealth: Payer: Self-pay | Admitting: *Deleted

## 2011-11-26 NOTE — Telephone Encounter (Signed)
Pt. Had a lesion removed by Dermatology in her scalp, and is asking if Dr. Lovell Sheehan can take them out next week?

## 2011-11-26 NOTE — Telephone Encounter (Signed)
See if padonda can take out

## 2011-11-26 NOTE — Telephone Encounter (Signed)
Patricia Elliott said she doesn't mind but dermatology typically prefers to take the sutures out themselves.

## 2011-11-29 ENCOUNTER — Telehealth: Payer: Self-pay | Admitting: *Deleted

## 2011-11-29 NOTE — Telephone Encounter (Signed)
Appointment made for thursday

## 2011-11-29 NOTE — Telephone Encounter (Signed)
Could you call pt and see when they need to come out?  I will find a place for her on our schedule--thanks

## 2011-11-29 NOTE — Telephone Encounter (Signed)
Pt stated sutures needs to come out this thursday

## 2011-11-29 NOTE — Telephone Encounter (Signed)
Pt will be here thurs 10.15am

## 2011-11-29 NOTE — Telephone Encounter (Signed)
Opened in error

## 2011-11-29 NOTE — Telephone Encounter (Signed)
Pt can not come in until around 1145am

## 2011-11-29 NOTE — Telephone Encounter (Signed)
Can you put in on Thursday at 10:15?  And let pt know thanks

## 2011-12-02 ENCOUNTER — Ambulatory Visit (INDEPENDENT_AMBULATORY_CARE_PROVIDER_SITE_OTHER): Admitting: Internal Medicine

## 2011-12-02 ENCOUNTER — Encounter: Payer: Self-pay | Admitting: Internal Medicine

## 2011-12-02 VITALS — BP 130/80 | HR 72 | Temp 98.2°F | Resp 16

## 2011-12-02 DIAGNOSIS — S0100XA Unspecified open wound of scalp, initial encounter: Secondary | ICD-10-CM

## 2011-12-02 DIAGNOSIS — S0101XA Laceration without foreign body of scalp, initial encounter: Secondary | ICD-10-CM

## 2011-12-02 NOTE — Patient Instructions (Signed)
Keep site clean and dry for the next several days

## 2011-12-02 NOTE — Progress Notes (Signed)
  Subjective:    Patient ID: Patricia Elliott, female    DOB: April 27, 1956, 56 y.o.   MRN: [id number]  HPI    Review of Systems     Objective:   Physical Exam        Assessment & Plan:  Suture removal by nonoperative physician For sutures removed from her scalp good wound approximation was noted post suture removal care discussed with patient.

## 2012-01-18 ENCOUNTER — Other Ambulatory Visit (INDEPENDENT_AMBULATORY_CARE_PROVIDER_SITE_OTHER)

## 2012-01-18 DIAGNOSIS — Z0000 Encounter for general adult medical examination without abnormal findings: Secondary | ICD-10-CM

## 2012-01-18 DIAGNOSIS — E119 Type 2 diabetes mellitus without complications: Secondary | ICD-10-CM

## 2012-01-18 DIAGNOSIS — E785 Hyperlipidemia, unspecified: Secondary | ICD-10-CM

## 2012-01-18 DIAGNOSIS — I10 Essential (primary) hypertension: Secondary | ICD-10-CM

## 2012-01-18 LAB — HEPATIC FUNCTION PANEL
ALT: 17 U/L (ref 0–35)
AST: 16 U/L (ref 0–37)
Albumin: 4.1 g/dL (ref 3.5–5.2)

## 2012-01-18 LAB — POCT URINALYSIS DIPSTICK
Blood, UA: NEGATIVE
Spec Grav, UA: >=1.03
Urobilinogen, UA: 0.2

## 2012-01-18 LAB — CBC WITH DIFFERENTIAL/PLATELET
Eosinophils Absolute: 0.1 10*3/uL (ref 0.0–0.7)
Eosinophils Relative: 2.7 % (ref 0.0–5.0)
MCHC: 32.5 g/dL (ref 30.0–36.0)
MCV: 89 fl (ref 78.0–100.0)
Monocytes Absolute: 0.5 10*3/uL (ref 0.1–1.0)
Neutrophils Relative %: 48.6 % (ref 43.0–77.0)
Platelets: 213 10*3/uL (ref 150.0–400.0)
WBC: 5.2 10*3/uL (ref 4.5–10.5)

## 2012-01-18 LAB — LIPID PANEL
HDL: 46.7 mg/dL (ref >39.00)
Total CHOL/HDL Ratio: 3
Triglycerides: 100 mg/dL (ref 0.0–149.0)
VLDL: 20 mg/dL (ref 0.0–40.0)

## 2012-01-18 LAB — BASIC METABOLIC PANEL
CO2: 27 mEq/L (ref 19–32)
Calcium: 9.5 mg/dL (ref 8.4–10.5)
Creatinine, Ser: 0.8 mg/dL (ref 0.4–1.2)
GFR: 97.85 mL/min (ref >60.00)

## 2012-01-18 LAB — MICROALBUMIN / CREATININE URINE RATIO
Creatinine,U: 313.1 mg/dL
Microalb, Ur: 1.4 mg/dL (ref 0.0–1.9)

## 2012-01-18 NOTE — Progress Notes (Signed)
Addended by: Bonnye Fava on: 01/18/2012 09:29 AM   Modules accepted: Orders

## 2012-01-25 ENCOUNTER — Ambulatory Visit (INDEPENDENT_AMBULATORY_CARE_PROVIDER_SITE_OTHER): Admitting: Internal Medicine

## 2012-01-25 ENCOUNTER — Ambulatory Visit (INDEPENDENT_AMBULATORY_CARE_PROVIDER_SITE_OTHER)
Admission: RE | Admit: 2012-01-25 | Discharge: 2012-01-25 | Disposition: A | Discharge status: Home / Self Care | Payer: BC Managed Care – PPO | Source: Ambulatory Visit | Attending: Internal Medicine | Admitting: Internal Medicine

## 2012-01-25 ENCOUNTER — Encounter: Payer: Self-pay | Admitting: Internal Medicine

## 2012-01-25 VITALS — BP 130/80 | HR 72 | Temp 98.2°F | Resp 16 | Ht 64.0 in | Wt 188.0 lb

## 2012-01-25 DIAGNOSIS — M549 Dorsalgia, unspecified: Secondary | ICD-10-CM

## 2012-01-25 DIAGNOSIS — E785 Hyperlipidemia, unspecified: Secondary | ICD-10-CM

## 2012-01-25 DIAGNOSIS — Z0000 Encounter for general adult medical examination without abnormal findings: Secondary | ICD-10-CM

## 2012-01-25 DIAGNOSIS — I10 Essential (primary) hypertension: Secondary | ICD-10-CM

## 2012-01-25 MED ORDER — PREDNISONE 20 MG PO TABS: 20.0000 mg | ORAL_TABLET | Freq: Every day | ORAL | Status: AC

## 2012-01-25 NOTE — Progress Notes (Signed)
Subjective:    Patient ID: Patricia Elliott, female    DOB: [birth year], 56 y.o.   MRN: [id number]  HPI  Patient is a 56 year old [place] American female who presents for her annual examination.  Other problems that she is followed for include diabetes, hyperlipidemia, hypertension, and a history of retinitis pigmentosa followed by ophthalmology. The patient's dermatological problem was finally diagnosed as a fungal infection she was treated with appropriate cream and the symptoms have resolved.  The patient has a new complaint of left hip pain starting at the lateral hip and radiates down the leg to the ankle she cannot report whether or not laying on the left side effects of more than not patient states that when the pain occurs if she stands up the pain becomes unbearable.    Review of Systems  Constitutional: Negative for activity change, appetite change and fatigue.  HENT: Negative for ear pain, congestion, neck pain, postnasal drip and sinus pressure.   Eyes: Negative for redness and visual disturbance.  Respiratory: Negative for cough, shortness of breath and wheezing.   Gastrointestinal: Negative for abdominal pain and abdominal distention.  Genitourinary: Negative for dysuria, frequency and menstrual problem.  Musculoskeletal: Positive for myalgias, arthralgias and gait problem. Negative for joint swelling.       Pain in left hip  Skin: Negative for rash and wound.  Neurological: Negative for dizziness, weakness and headaches.  Hematological: Negative for adenopathy. Does not bruise/bleed easily.  Psychiatric/Behavioral: Negative for sleep disturbance and decreased concentration.   Past Medical History  Diagnosis Date  . Hypertension   . Retinitis pigmentosa   . Hyperlipidemia   . Diabetes mellitus   . GERD (gastroesophageal reflux disease)   . Arthritis     History   Social History  . Marital Status: Married    Spouse Name: N/A    Number of Children: N/A  . Years  of Education: N/A   Occupational History  . Not on file.   Social History Main Topics  . Smoking status: Never Smoker   . Smokeless tobacco: Not on file  . Alcohol Use: No  . Drug Use: No  . Sexually Active: Not on file   Other Topics Concern  . Not on file   Social History Narrative  . No narrative on file    Past Surgical History  Procedure Date  . Cesarean section   . Abdominal hysterectomy     Family History  Problem Relation Age of Onset  . Diabetes    . Hypertension      No Known Allergies  Current Outpatient Prescriptions on File Prior to Visit  Medication Sig Dispense Refill  . glucose blood test strip 1 each by Other route as needed. Use as instructed       . lansoprazole (PREVACID) 15 MG capsule Take 15 mg by mouth daily.        Marland Kitchen olmesartan-hydrochlorothiazide (BENICAR HCT) 20-12.5 MG per tablet Take 1 tablet by mouth daily.  30 tablet  6  . rosuvastatin (CRESTOR) 20 MG tablet Take 1 tablet (20 mg total) by mouth as directed. Change to twice a week Monday and Friday      . Saxagliptin-Metformin (KOMBIGLYZE XR) 02-999 MG TB24 Take 1 tablet by mouth daily.  30 tablet  6    BP 130/80  Pulse 72  Temp 98.2 F (36.8 C)  Resp 16  Ht 5\' 4"  (1.626 m)  Wt 188 lb (85.276 kg)  BMI 32.27 kg/m2  Objective:   Physical Exam  Nursing note and vitals reviewed. Constitutional: She is oriented to person, place, and time. She appears well-developed and well-nourished. No distress.  HENT:  Head: Normocephalic and atraumatic.  Right Ear: External ear normal.  Left Ear: External ear normal.  Nose: Nose normal.  Mouth/Throat: Oropharynx is clear and moist.  Eyes: Conjunctivae and EOM are normal. Pupils are equal, round, and reactive to light.  Neck: Normal range of motion. Neck supple. No JVD present. No tracheal deviation present. No thyromegaly present.  Cardiovascular: Normal rate, regular rhythm, normal heart sounds and intact distal pulses.   No murmur  heard. Pulmonary/Chest: Effort normal and breath sounds normal. She has no wheezes. She exhibits no tenderness.  Abdominal: Soft. Bowel sounds are normal.  Musculoskeletal: She exhibits tenderness. She exhibits no edema.       Sciatica pattern left hip  Lymphadenopathy:    She has no cervical adenopathy.  Neurological: She is alert and oriented to person, place, and time. She has normal reflexes. No cranial nerve deficit.  Skin: Skin is warm and dry. She is not diaphoretic.  Psychiatric: She has a normal mood and affect. Her behavior is normal.          Assessment & Plan:   This is a routine physical examination for this healthy  Female. Reviewed all health maintenance protocols including mammography colonoscopy bone density and reviewed appropriate screening labs. Her immunization history was reviewed as well as her current medications and allergies refills of her chronic medications were given and the plan for yearly health maintenance was discussed all orders and referrals were made as appropriate.  The differential diagnosis for the The pain is either sciatica or a ruptured disc do an LS spine today and begin her on a Medrol Dosepak at the LS pain suggests disc narrowing and the L5 S1 space would recommend an MRI

## 2012-01-25 NOTE — Patient Instructions (Signed)
Your back pain has 2 different differential diagnoses 1 would be a ruptured disc at the low back the other would be an inflammation of the sciatic nerve both of these can be treated with a prednisone Dosepak. We will obtain x-rays today to help [place] in the differential diagnosis and have called in a prednisone Dosepak to your pharmacy

## 2012-01-26 ENCOUNTER — Encounter: Payer: Self-pay | Admitting: *Deleted

## 2012-01-26 ENCOUNTER — Other Ambulatory Visit: Payer: Self-pay | Admitting: *Deleted

## 2012-01-26 ENCOUNTER — Telehealth: Payer: Self-pay | Admitting: Internal Medicine

## 2012-01-26 MED ORDER — GLUCOSE BLOOD VI STRP: ORAL_STRIP | Status: AC

## 2012-01-26 MED ORDER — SAXAGLIPTIN-METFORMIN ER 5-1000 MG PO TB24: 1.0000 | ORAL_TABLET | Freq: Every day | ORAL | Status: DC

## 2012-01-26 NOTE — Telephone Encounter (Signed)
Pt wanted to inform you that theGlucose reader she uses is an Accu- Chek Aviva  Pt also requesting a doctor letter stating that she had a cpx and "age appreciate blood work" Please contact when ready to pick up

## 2012-01-26 NOTE — Telephone Encounter (Signed)
Refills sent in and letter out front for pt topick up and she is aware

## 2012-01-27 ENCOUNTER — Telehealth: Payer: Self-pay | Admitting: Internal Medicine

## 2012-01-27 NOTE — Telephone Encounter (Signed)
Open in error

## 2012-02-07 ENCOUNTER — Telehealth: Payer: Self-pay | Admitting: Family Medicine

## 2012-02-07 NOTE — Telephone Encounter (Signed)
countour is made by [employer] and they have gone out of business-- lifescan I am not familiar with- do you mind trying a pa?

## 2012-02-07 NOTE — Telephone Encounter (Signed)
Patient's insurance will not pay for Accu-Chek Aviva Plus Test Strips. Their preferred brands are: [employer]. Please switch or let me know why that would not be possible, & then I can start a Prior Auth

## 2012-02-08 NOTE — Telephone Encounter (Signed)
thanks

## 2012-02-08 NOTE — Telephone Encounter (Signed)
Not at all.  °

## 2012-02-14 ENCOUNTER — Other Ambulatory Visit: Payer: Self-pay | Admitting: *Deleted

## 2012-02-14 MED ORDER — GLUCOSE BLOOD VI STRP: ORAL_STRIP | Status: AC

## 2012-02-14 NOTE — Telephone Encounter (Signed)
Patricia Elliott, Patricia Elliott's insurance prefers the One Touch test strips. Can Patricia Elliott use those? If so, please send in new script. If not, let me know why, and I will re-submit prior auth. Thanks!

## 2012-02-14 NOTE — Telephone Encounter (Signed)
We have a 1 touch ultra mini that we will try and send in strip-pt has been informed

## 2012-04-03 ENCOUNTER — Ambulatory Visit (INDEPENDENT_AMBULATORY_CARE_PROVIDER_SITE_OTHER): Payer: BC Managed Care – PPO | Admitting: Internal Medicine

## 2012-04-03 ENCOUNTER — Encounter: Payer: Self-pay | Admitting: Internal Medicine

## 2012-04-03 ENCOUNTER — Other Ambulatory Visit: Payer: Self-pay | Admitting: Internal Medicine

## 2012-04-03 VITALS — BP 134/80 | HR 76 | Temp 98.2°F | Resp 16 | Ht 64.0 in | Wt 185.0 lb

## 2012-04-03 DIAGNOSIS — E785 Hyperlipidemia, unspecified: Secondary | ICD-10-CM

## 2012-04-03 DIAGNOSIS — I10 Essential (primary) hypertension: Secondary | ICD-10-CM

## 2012-04-03 DIAGNOSIS — M545 Low back pain: Secondary | ICD-10-CM

## 2012-04-03 DIAGNOSIS — IMO0002 Reserved for concepts with insufficient information to code with codable children: Secondary | ICD-10-CM

## 2012-04-03 DIAGNOSIS — G8929 Other chronic pain: Secondary | ICD-10-CM

## 2012-04-03 NOTE — Patient Instructions (Signed)
The patient is instructed to continue all medications as prescribed. Schedule followup with check out clerk upon leaving the clinic  

## 2012-04-03 NOTE — Progress Notes (Signed)
Subjective:    Patient ID: Patricia Elliott, female    DOB: 1956-05-20, 56 y.o.   MRN: [id number]  HPI patient is a 56 year old female who presents for followup of hyperlipidemia hypertension a history of gastroesophageal reflux no history of diabetes.  She believes that her CBGs indicated good diabetic control she states that when CBG is elevated she can related directly to dietary indiscretion.  Her GERD is stable and she has no current symptomology of stricture.  Her primary complaint today is pain radiating to her hip down her left leg.  X-rays were obtained which showed joint L2-L3 disc space narrowing The pain she is experiencing does always her ankle and is a daily occurrence When she falls to sleep and when she wakes up she does experience pain This did not change with a prednisone burst and taper. This is persistent now for greater than 6 months    Review of Systems  Constitutional: Negative for activity change, appetite change and fatigue.  HENT: Negative for ear pain, congestion, neck pain, postnasal drip and sinus pressure.   Eyes: Negative for redness and visual disturbance.  Respiratory: Negative for cough, shortness of breath and wheezing.   Gastrointestinal: Negative for abdominal pain and abdominal distention.  Genitourinary: Negative for dysuria, frequency and menstrual problem.  Musculoskeletal: Positive for back pain and gait problem. Negative for myalgias, joint swelling and arthralgias.  Skin: Negative for rash and wound.  Neurological: Positive for weakness. Negative for dizziness and headaches.  Hematological: Negative for adenopathy. Does not bruise/bleed easily.  Psychiatric/Behavioral: Negative for sleep disturbance and decreased concentration.       Past Medical History  Diagnosis Date  . Hypertension   . Retinitis pigmentosa   . Hyperlipidemia   . Diabetes mellitus   . GERD (gastroesophageal reflux disease)   . Arthritis     History   Social  History  . Marital Status: Married    Spouse Name: N/A    Number of Children: N/A  . Years of Education: N/A   Occupational History  . Not on file.   Social History Main Topics  . Smoking status: Never Smoker   . Smokeless tobacco: Not on file  . Alcohol Use: No  . Drug Use: No  . Sexually Active: Not on file   Other Topics Concern  . Not on file   Social History Narrative  . No narrative on file    Past Surgical History  Procedure Date  . Cesarean section   . Abdominal hysterectomy     Family History  Problem Relation Age of Onset  . Diabetes    . Hypertension      No Known Allergies  Current Outpatient Prescriptions on File Prior to Visit  Medication Sig Dispense Refill  . glucose blood (ACCU-CHEK AVIVA) test strip Use as instructed  100 each  12  . glucose blood (ONE TOUCH ULTRA TEST) test strip Use as instructed  100 each  12  . glucose blood test strip 1 each by Other route as needed. Use as instructed       . lansoprazole (PREVACID) 15 MG capsule Take 15 mg by mouth daily.        Marland Kitchen olmesartan-hydrochlorothiazide (BENICAR HCT) 20-12.5 MG per tablet Take 1 tablet by mouth daily.  30 tablet  6  . rosuvastatin (CRESTOR) 20 MG tablet Take 1 tablet (20 mg total) by mouth as directed. Change to twice a week Monday and Friday      . Saxagliptin-Metformin (  KOMBIGLYZE XR) 02-999 MG TB24 Take 1 tablet by mouth daily.  90 tablet  3    BP 134/80  Pulse 76  Temp 98.2 F (36.8 C)  Resp 16  Ht 5\' 4"  (1.626 m)  Wt 185 lb (83.915 kg)  BMI 31.76 kg/m2    Objective:   Physical Exam  Nursing note and vitals reviewed. Constitutional: She is oriented to person, place, and time. She appears well-developed and well-nourished. No distress.  HENT:  Head: Normocephalic and atraumatic.  Right Ear: External ear normal.  Left Ear: External ear normal.  Nose: Nose normal.  Mouth/Throat: Oropharynx is clear and moist.  Eyes: Conjunctivae and EOM are normal. Pupils are equal,  round, and reactive to light.  Neck: Normal range of motion. Neck supple. No JVD present. No tracheal deviation present. No thyromegaly present.  Cardiovascular: Normal rate, regular rhythm, normal heart sounds and intact distal pulses.   No murmur heard. Pulmonary/Chest: Effort normal and breath sounds normal. She has no wheezes. She exhibits no tenderness.  Abdominal: Soft. Bowel sounds are normal.  Musculoskeletal: She exhibits edema and tenderness.       Left-sided radiculopathy  Lymphadenopathy:    She has no cervical adenopathy.  Neurological: She is alert and oriented to person, place, and time. She has normal reflexes. No cranial nerve deficit.  Skin: Skin is warm and dry. She is not diaphoretic.  Psychiatric: She has a normal mood and affect. Her behavior is normal.          Assessment & Plan:  Patient has left-sided radiculopathy that is persistent for greater than 6 months she had plain films that showed L3 disc space narrowing but this is not in agreement with her physical symptoms with pain radiating all the down to the foot this either represents sciatica which will result in a referral to a neurologist because of its chronic  chronic nature or radicular pain which was not evident in the plain films.  Given her history and chronicity of her pain and the fact that it affects her both laying down and standing believe an MRI will help [place] to delineate if the pain is two-week more sciatic nerve an etiology and we'll refer her to a neurologist for control.  At that point a course of lyrica would be warranted  Patient diabetes is in stable control blood pressures in stable control we'll monitor appropriate blood work prior to her 3 month return office visit

## 2012-04-09 ENCOUNTER — Ambulatory Visit
Admission: RE | Admit: 2012-04-09 | Discharge: 2012-04-09 | Disposition: A | Discharge status: Home / Self Care | Payer: BC Managed Care – PPO | Source: Ambulatory Visit | Attending: Internal Medicine | Admitting: Internal Medicine

## 2012-04-09 DIAGNOSIS — M545 Low back pain, unspecified: Secondary | ICD-10-CM

## 2012-04-17 ENCOUNTER — Other Ambulatory Visit: Payer: Self-pay | Admitting: *Deleted

## 2012-04-17 DIAGNOSIS — M5126 Other intervertebral disc displacement, lumbar region: Secondary | ICD-10-CM

## 2012-04-17 NOTE — Telephone Encounter (Signed)
Talked with pt and referral sent to [facility]

## 2012-05-12 ENCOUNTER — Other Ambulatory Visit: Payer: Self-pay | Admitting: Obstetrics and Gynecology

## 2012-05-12 DIAGNOSIS — Z1231 Encounter for screening mammogram for malignant neoplasm of breast: Secondary | ICD-10-CM

## 2012-05-23 ENCOUNTER — Telehealth: Payer: Self-pay | Admitting: Internal Medicine

## 2012-05-23 NOTE — Telephone Encounter (Signed)
Advised pt that she has refills left on the Rx sent in by [facility] in April. Also called the pharmacy to initiate the filling process for pt

## 2012-05-23 NOTE — Telephone Encounter (Signed)
Caller: Lexine/Patient is calling with a question about Glucose Strips for Glucometer/One Touch Ultra Mini.The medication was written by Darryll Capers. Picked up a One Touch Ultra Mini Glucometer 2 months ago, kit had some sample glucose strips included.  Now realizing needing order for further test strips.  Has been checking glucose readings once per week. WANTING PRESCRIPTION FOR GLUCOSE STRIPS CALLED TO [facility] [address]  [phone]. Patient can be reached at  work   [phone] as needed.

## 2012-07-03 ENCOUNTER — Other Ambulatory Visit (INDEPENDENT_AMBULATORY_CARE_PROVIDER_SITE_OTHER): Payer: BC Managed Care – PPO

## 2012-07-03 ENCOUNTER — Other Ambulatory Visit: Payer: BC Managed Care – PPO

## 2012-07-03 DIAGNOSIS — I10 Essential (primary) hypertension: Secondary | ICD-10-CM

## 2012-07-03 DIAGNOSIS — E785 Hyperlipidemia, unspecified: Secondary | ICD-10-CM

## 2012-07-03 LAB — BASIC METABOLIC PANEL
CO2: 28 mEq/L (ref 19–32)
Calcium: 9.6 mg/dL (ref 8.4–10.5)
Creatinine, Ser: 0.8 mg/dL (ref 0.4–1.2)
GFR: 100.66 mL/min (ref >60.00)
Sodium: 140 mEq/L (ref 135–145)

## 2012-07-03 LAB — LIPID PANEL
Cholesterol: 168 mg/dL (ref 0–200)
HDL: 52.3 mg/dL (ref >39.00)
Total CHOL/HDL Ratio: 3
Triglycerides: 77 mg/dL (ref 0.0–149.0)

## 2012-07-03 LAB — HEPATIC FUNCTION PANEL
ALT: 16 U/L (ref 0–35)
AST: 15 U/L (ref 0–37)
Albumin: 4.2 g/dL (ref 3.5–5.2)
Alkaline Phosphatase: 63 U/L (ref 39–117)
Total Protein: 7.7 g/dL (ref 6.0–8.3)

## 2012-07-03 LAB — HEMOGLOBIN A1C: Hgb A1c MFr Bld: 6.1 % (ref 4.6–6.5)

## 2012-07-03 NOTE — Patient Instructions (Signed)
, °

## 2012-07-10 ENCOUNTER — Ambulatory Visit: Payer: BC Managed Care – PPO | Admitting: Internal Medicine

## 2012-07-10 ENCOUNTER — Ambulatory Visit: Payer: BC Managed Care – PPO

## 2012-07-11 ENCOUNTER — Ambulatory Visit
Admission: RE | Admit: 2012-07-11 | Discharge: 2012-07-11 | Disposition: A | Discharge status: Home / Self Care | Payer: BC Managed Care – PPO | Source: Ambulatory Visit | Attending: Obstetrics and Gynecology | Admitting: Obstetrics and Gynecology

## 2012-07-11 DIAGNOSIS — Z1231 Encounter for screening mammogram for malignant neoplasm of breast: Secondary | ICD-10-CM

## 2012-08-07 ENCOUNTER — Ambulatory Visit (INDEPENDENT_AMBULATORY_CARE_PROVIDER_SITE_OTHER): Payer: BC Managed Care – PPO | Admitting: Internal Medicine

## 2012-08-07 ENCOUNTER — Encounter: Payer: Self-pay | Admitting: Internal Medicine

## 2012-08-07 VITALS — BP 126/72 | HR 76 | Temp 98.6°F | Resp 16 | Ht 64.0 in | Wt 188.0 lb

## 2012-08-07 DIAGNOSIS — I10 Essential (primary) hypertension: Secondary | ICD-10-CM

## 2012-08-07 NOTE — Progress Notes (Signed)
Subjective:    Patient ID: Patricia Elliott, female    DOB: 01/24/1956, 56 y.o.   MRN: [id number]  HPI ROV for DM, HTN and lipid management Labs in advance of visit reviewed   Review of Systems  Constitutional: Negative for activity change, appetite change and fatigue.  HENT: Negative for ear pain, congestion, neck pain, postnasal drip and sinus pressure.   Eyes: Negative for redness and visual disturbance.  Respiratory: Negative for cough, shortness of breath and wheezing.   Gastrointestinal: Negative for abdominal pain and abdominal distention.  Genitourinary: Negative for dysuria, frequency and menstrual problem.  Musculoskeletal: Negative for myalgias, joint swelling and arthralgias.  Skin: Negative for rash and wound.  Neurological: Negative for dizziness, weakness and headaches.  Hematological: Negative for adenopathy. Does not bruise/bleed easily.  Psychiatric/Behavioral: Negative for disturbed wake/sleep cycle and decreased concentration.   Past Medical History  Diagnosis Date  . Hypertension   . Retinitis pigmentosa   . Hyperlipidemia   . Diabetes mellitus   . GERD (gastroesophageal reflux disease)   . Arthritis     History   Social History  . Marital Status: Married    Spouse Name: N/A    Number of Children: N/A  . Years of Education: N/A   Occupational History  . Not on file.   Social History Main Topics  . Smoking status: Never Smoker   . Smokeless tobacco: Not on file  . Alcohol Use: No  . Drug Use: No  . Sexually Active: Not on file   Other Topics Concern  . Not on file   Social History Narrative  . No narrative on file    Past Surgical History  Procedure Date  . Cesarean section   . Abdominal hysterectomy     Family History  Problem Relation Age of Onset  . Diabetes    . Hypertension      No Known Allergies  Current Outpatient Prescriptions on File Prior to Visit  Medication Sig Dispense Refill  . glucose blood (ACCU-CHEK AVIVA)  test strip Use as instructed  100 each  12  . glucose blood (ONE TOUCH ULTRA TEST) test strip Use as instructed  100 each  12  . glucose blood test strip 1 each by Other route as needed. Use as instructed       . lansoprazole (PREVACID) 15 MG capsule Take 15 mg by mouth daily.        Marland Kitchen olmesartan-hydrochlorothiazide (BENICAR HCT) 20-12.5 MG per tablet Take 1 tablet by mouth daily.  30 tablet  6  . rosuvastatin (CRESTOR) 20 MG tablet Take 1 tablet (20 mg total) by mouth as directed. Change to twice a week Monday and Friday      . Saxagliptin-Metformin (KOMBIGLYZE XR) 02-999 MG TB24 Take 1 tablet by mouth daily.  90 tablet  3    BP 126/72  Pulse 76  Temp 98.6 F (37 C)  Resp 16  Ht 5\' 4"  (1.626 m)  Wt 188 lb (85.276 kg)  BMI 32.27 kg/m2        Objective:   Physical Exam  Constitutional: She is oriented to person, place, and time. She appears well-developed and well-nourished. No distress.  HENT:  Head: Normocephalic and atraumatic.  Right Ear: External ear normal.  Left Ear: External ear normal.  Nose: Nose normal.  Mouth/Throat: Oropharynx is clear and moist.  Eyes: Conjunctivae normal and EOM are normal. Pupils are equal, round, and reactive to light.  Neck: Normal range of motion. Neck  supple. No JVD present. No tracheal deviation present. No thyromegaly present.  Cardiovascular: Normal rate, regular rhythm, normal heart sounds and intact distal pulses.   No murmur heard. Pulmonary/Chest: Effort normal and breath sounds normal. She has no wheezes. She exhibits no tenderness.  Abdominal: Soft. Bowel sounds are normal.  Musculoskeletal: Normal range of motion. She exhibits no edema and no tenderness.  Lymphadenopathy:    She has no cervical adenopathy.  Neurological: She is alert and oriented to person, place, and time. She has normal reflexes. No cranial nerve deficit.  Skin: Skin is warm and dry. She is not diaphoretic.  Psychiatric: She has a normal mood and affect. Her  behavior is normal.          Assessment & Plan:  A1C of 6.1 showed good control of DM Lipid panel was at goal HTN controlled

## 2012-08-07 NOTE — Patient Instructions (Addendum)
The patient is instructed to continue all medications as prescribed. Schedule followup with check out clerk upon leaving the clinic  

## 2012-10-13 ENCOUNTER — Other Ambulatory Visit: Payer: Self-pay | Admitting: Internal Medicine

## 2012-10-13 MED ORDER — OLMESARTAN MEDOXOMIL-HCTZ 20-12.5 MG PO TABS: 1.0000 | ORAL_TABLET | Freq: Every day | ORAL | Status: DC

## 2012-10-13 NOTE — Telephone Encounter (Signed)
rx sent in electronically 

## 2012-10-13 NOTE — Telephone Encounter (Signed)
Pt needs script for (BENICAR HCT) 20-12.5 MG per tablet, 90 day supply, Pharm: [facility].

## 2013-01-17 ENCOUNTER — Telehealth: Payer: Self-pay | Admitting: Internal Medicine

## 2013-01-17 MED ORDER — LOSARTAN POTASSIUM-HCTZ 50-12.5 MG PO TABS: 1.0000 | ORAL_TABLET | Freq: Every day | ORAL | Status: DC

## 2013-01-17 NOTE — Telephone Encounter (Signed)
Per dr Lovell Sheehan- change to hyzaar 50/`1.5-sent to [employer] and pt informed

## 2013-01-17 NOTE — Telephone Encounter (Signed)
Pt no longer [employer], [employer] only. [employer] will not pay for Benicar. Pt needs alternate or do we have any samples?  olmesartan-hydrochlorothiazide (BENICAR HCT) 20-12.5 MG per . Pls advise.

## 2013-01-23 ENCOUNTER — Other Ambulatory Visit: Payer: Self-pay | Admitting: *Deleted

## 2013-01-23 MED ORDER — LOSARTAN POTASSIUM-HCTZ 50-12.5 MG PO TABS: 1.0000 | ORAL_TABLET | Freq: Every day | ORAL | Status: DC

## 2013-01-25 ENCOUNTER — Telehealth: Payer: Self-pay | Admitting: Internal Medicine

## 2013-01-25 NOTE — Telephone Encounter (Signed)
Pt was prescribed benicar hct 20-12.5 and hyzaar 50-12.5. Pt would like to know which one should she be taking.

## 2013-01-25 NOTE — Telephone Encounter (Signed)
Take the hyzaar-pt informed

## 2013-01-31 ENCOUNTER — Other Ambulatory Visit (INDEPENDENT_AMBULATORY_CARE_PROVIDER_SITE_OTHER)

## 2013-01-31 DIAGNOSIS — I10 Essential (primary) hypertension: Secondary | ICD-10-CM

## 2013-01-31 DIAGNOSIS — Z0000 Encounter for general adult medical examination without abnormal findings: Secondary | ICD-10-CM

## 2013-01-31 DIAGNOSIS — IMO0001 Reserved for inherently not codable concepts without codable children: Secondary | ICD-10-CM

## 2013-01-31 LAB — LIPID PANEL
Cholesterol: 154 mg/dL (ref 0–200)
HDL: 38.2 mg/dL — ABNORMAL LOW (ref >39.00)
VLDL: 13.4 mg/dL (ref 0.0–40.0)

## 2013-01-31 LAB — POCT URINALYSIS DIPSTICK
Leukocytes, UA: NEGATIVE
Nitrite, UA: NEGATIVE
Spec Grav, UA: >=1.03
Urobilinogen, UA: 1

## 2013-01-31 LAB — MICROALBUMIN / CREATININE URINE RATIO
Creatinine,U: 391.7 mg/dL
Microalb, Ur: 2.4 mg/dL — ABNORMAL HIGH (ref 0.0–1.9)

## 2013-01-31 LAB — CBC WITH DIFFERENTIAL/PLATELET
Eosinophils Absolute: 0.1 10*3/uL (ref 0.0–0.7)
MCHC: 33.1 g/dL (ref 30.0–36.0)
MCV: 86.7 fl (ref 78.0–100.0)
Monocytes Absolute: 0.4 10*3/uL (ref 0.1–1.0)
Neutrophils Relative %: 54 % (ref 43.0–77.0)
Platelets: 212 10*3/uL (ref 150.0–400.0)
RDW: 15.6 % — ABNORMAL HIGH (ref 11.5–14.6)

## 2013-01-31 LAB — HEPATIC FUNCTION PANEL
ALT: 18 U/L (ref 0–35)
Bilirubin, Direct: 0.1 mg/dL (ref 0.0–0.3)
Total Bilirubin: 0.5 mg/dL (ref 0.3–1.2)
Total Protein: 7.9 g/dL (ref 6.0–8.3)

## 2013-01-31 LAB — BASIC METABOLIC PANEL
BUN: 12 mg/dL (ref 6–23)
GFR: 88.29 mL/min (ref >60.00)
Potassium: 3.8 mEq/L (ref 3.5–5.1)
Sodium: 138 mEq/L (ref 135–145)

## 2013-01-31 LAB — HEMOGLOBIN A1C: Hgb A1c MFr Bld: 6.3 % (ref 4.6–6.5)

## 2013-02-04 ENCOUNTER — Other Ambulatory Visit: Payer: Self-pay | Admitting: Internal Medicine

## 2013-02-07 ENCOUNTER — Ambulatory Visit (INDEPENDENT_AMBULATORY_CARE_PROVIDER_SITE_OTHER): Admitting: Internal Medicine

## 2013-02-07 ENCOUNTER — Encounter: Payer: Self-pay | Admitting: Internal Medicine

## 2013-02-07 VITALS — BP 130/84 | HR 76 | Resp 16 | Ht 64.0 in | Wt 184.0 lb

## 2013-02-07 DIAGNOSIS — I10 Essential (primary) hypertension: Secondary | ICD-10-CM

## 2013-02-07 DIAGNOSIS — Z0000 Encounter for general adult medical examination without abnormal findings: Secondary | ICD-10-CM

## 2013-02-07 MED ORDER — LINAGLIPTIN-METFORMIN HCL 2.5-500 MG PO TABS: 1.0000 | ORAL_TABLET | Freq: Two times a day (BID) | ORAL | Status: DC

## 2013-02-07 NOTE — Patient Instructions (Signed)
B1 and B12  For feet

## 2013-02-07 NOTE — Progress Notes (Signed)
Subjective:    Patient ID: Patricia Elliott, female    DOB: [birth year], 57 y.o.   MRN: [id number]  HPI  Presents for welness exam ADDM, HTN and  Lipid monitoring  Review of Systems  Constitutional: Negative for activity change, appetite change and fatigue.  HENT: Negative for ear pain, congestion, neck pain, postnasal drip and sinus pressure.   Eyes: Negative for redness and visual disturbance.  Respiratory: Negative for cough, shortness of breath and wheezing.   Gastrointestinal: Negative for abdominal pain and abdominal distention.  Genitourinary: Negative for dysuria, frequency and menstrual problem.  Musculoskeletal: Negative for myalgias, joint swelling and arthralgias.  Skin: Negative for rash and wound.  Neurological: Negative for dizziness, weakness and headaches.  Hematological: Negative for adenopathy. Does not bruise/bleed easily.  Psychiatric/Behavioral: Negative for sleep disturbance and decreased concentration.   Past Medical History  Diagnosis Date  . Hypertension   . Retinitis pigmentosa   . Hyperlipidemia   . Diabetes mellitus   . GERD (gastroesophageal reflux disease)   . Arthritis     History   Social History  . Marital Status: Married    Spouse Name: N/A    Number of Children: N/A  . Years of Education: N/A   Occupational History  . Not on file.   Social History Main Topics  . Smoking status: Never Smoker   . Smokeless tobacco: Not on file  . Alcohol Use: No  . Drug Use: No  . Sexually Active: Not on file   Other Topics Concern  . Not on file   Social History Narrative  . No narrative on file    Past Surgical History  Procedure Laterality Date  . Cesarean section    . Abdominal hysterectomy      Family History  Problem Relation Age of Onset  . Diabetes    . Hypertension      No Known Allergies  Current Outpatient Prescriptions on File Prior to Visit  Medication Sig Dispense Refill  . glucose blood (ONE TOUCH ULTRA TEST) test  strip Use as instructed  100 each  12  . glucose blood test strip 1 each by Other route as needed. Use as instructed       . KOMBIGLYZE XR 02-999 MG TB24 TAKE 1 TABLET BY MOUTH ONCE DAILY  90 tablet  3  . lansoprazole (PREVACID) 15 MG capsule Take 15 mg by mouth daily.        Marland Kitchen losartan-hydrochlorothiazide (HYZAAR) 50-12.5 MG per tablet Take 1 tablet by mouth daily.  90 tablet  3  . rosuvastatin (CRESTOR) 20 MG tablet Take 1 tablet (20 mg total) by mouth as directed. Change to twice a week Monday and Friday       No current facility-administered medications on file prior to visit.    BP 130/84  Pulse 76  Resp 16  Ht 5\' 4"  (1.626 m)  Wt 184 lb (83.462 kg)  BMI 31.57 kg/m2       Objective:   Physical Exam  Constitutional: She is oriented to person, place, and time. She appears well-developed and well-nourished. No distress.  HENT:  Head: Normocephalic and atraumatic.  Right Ear: External ear normal.  Left Ear: External ear normal.  Nose: Nose normal.  Mouth/Throat: Oropharynx is clear and moist.  Eyes: Conjunctivae and EOM are normal. Pupils are equal, round, and reactive to light.  Neck: Normal range of motion. Neck supple. No JVD present. No tracheal deviation present. No thyromegaly present.  Cardiovascular: Normal rate,  regular rhythm, normal heart sounds and intact distal pulses.   No murmur heard. Pulmonary/Chest: Effort normal and breath sounds normal. She has no wheezes. She exhibits no tenderness.  Abdominal: Soft. Bowel sounds are normal.  Musculoskeletal: Normal range of motion. She exhibits no edema and no tenderness.  Lymphadenopathy:    She has no cervical adenopathy.  Neurological: She is alert and oriented to person, place, and time. She has normal reflexes. No cranial nerve deficit.  Skin: Skin is warm and dry. She is not diaphoretic.  Nail shows striations without evidence of fungus  Psychiatric: She has a normal mood and affect. Her behavior is normal.           Assessment & Plan:   This is a routine physical examination for this healthy  Female. Reviewed all health maintenance protocols including mammography colonoscopy bone density and reviewed appropriate screening labs. Her immunization history was reviewed as well as her current medications and allergies refills of her chronic medications were given and the plan for yearly health maintenance was discussed all orders and referrals were made as appropriate.   Recommend foot soaks and white vinegar and water Recommendation continue to check feet 4 loss of sensation and any injuries Recommend diabetic eye examination Stable hypertension Stable diabetes elevated microalbumin to patient on fruit juice fast

## 2013-02-21 ENCOUNTER — Telehealth: Payer: Self-pay | Admitting: Internal Medicine

## 2013-02-21 NOTE — Telephone Encounter (Deleted)
Pt was given samples of        LINAGLIPTIN-METFORMIN HCL 2.5-500 MG PO TABS Date: 02/07/2013   Department: [facility]  Medication Ordering/Authorizing: Stacie Glaze, MD         Order Providers    Prescribing Provider Encounter Provider   Stacie Glaze, MD Stacie Glaze, MD         Medication Detail      Disp Refills Start End     Linagliptin-Metformin HCl (JENTADUETO) 2.5-500 MG TABS 60 tablet 11 02/07/2013     Sig - Route: Take 1 tablet by mouth 2 (two) times daily. - Oral             Associated Diagnoses    Type II or unspecified type diabetes mellitus without mention of complication, uncontrolled             Pharmacy    [id number] - [place] - [address]                 Additional Information    Associated Rep orts   View Encounter   Priority and er Details

## 2013-02-21 NOTE — Telephone Encounter (Signed)
error 

## 2013-02-22 NOTE — Telephone Encounter (Signed)
Error/kh 

## 2013-06-01 ENCOUNTER — Encounter: Payer: Self-pay | Admitting: Internal Medicine

## 2013-06-01 ENCOUNTER — Ambulatory Visit (INDEPENDENT_AMBULATORY_CARE_PROVIDER_SITE_OTHER): Admitting: Internal Medicine

## 2013-06-01 VITALS — BP 130/78 | HR 72 | Temp 98.2°F | Resp 16 | Ht 64.0 in | Wt 190.0 lb

## 2013-06-01 DIAGNOSIS — E785 Hyperlipidemia, unspecified: Secondary | ICD-10-CM

## 2013-06-01 DIAGNOSIS — I10 Essential (primary) hypertension: Secondary | ICD-10-CM

## 2013-06-01 DIAGNOSIS — IMO0001 Reserved for inherently not codable concepts without codable children: Secondary | ICD-10-CM

## 2013-06-01 LAB — HEMOGLOBIN A1C: Hgb A1c MFr Bld: 6.8 % — ABNORMAL HIGH (ref 4.6–6.5)

## 2013-06-01 LAB — BASIC METABOLIC PANEL
BUN: 12 mg/dL (ref 6–23)
Calcium: 9.8 mg/dL (ref 8.4–10.5)
GFR: 93.23 mL/min (ref >60.00)
Glucose, Bld: 149 mg/dL — ABNORMAL HIGH (ref 70–99)

## 2013-06-01 NOTE — Progress Notes (Signed)
  Subjective:    Patient ID: Patricia Elliott, female    DOB: [birth year], 57 y.o.   MRN: [id number]  HPIpresents for followup of her new diabetic regimen she does not believe that her diabetes isn't as well controlled as it was prior to the change from previous opacification the change was mandated due to formulary of her insurance company.  Her CBGs have ranged in the 150 range with for a hemoglobin A1c reflected a tighter control with hemoglobin A1c of 6.1.  She is controlled hypertension her current medications    Review of Systems     Objective:   Physical Exam  Nursing note and vitals reviewed. Constitutional: She is oriented to person, place, and time. She appears well-developed and well-nourished. No distress.  HENT:  Head: Normocephalic and atraumatic.  Right Ear: External ear normal.  Left Ear: External ear normal.  Nose: Nose normal.  Mouth/Throat: Oropharynx is clear and moist.  Eyes: Conjunctivae and EOM are normal. Pupils are equal, round, and reactive to light.  Neck: Normal range of motion. Neck supple. No JVD present. No tracheal deviation present. No thyromegaly present.  Cardiovascular: Normal rate and regular rhythm.   No murmur heard. Pulmonary/Chest: Effort normal and breath sounds normal. She has no wheezes. She exhibits no tenderness.  Abdominal: Soft. Bowel sounds are normal.  Musculoskeletal: Normal range of motion. She exhibits no edema and no tenderness.  Lymphadenopathy:    She has no cervical adenopathy.  Neurological: She is alert and oriented to person, place, and time. She has normal reflexes. No cranial nerve deficit.  Skin: Skin is warm and dry. She is not diaphoretic.  Psychiatric: She has a normal mood and affect. Her behavior is normal.          Assessment & Plan:  Stable hypertension  Measure hemoglobin A1c and treatment today as well as urine for microalbumin.  I suspect we will need to titrate her medication from the 5/500 to be  5/850

## 2013-06-01 NOTE — Patient Instructions (Signed)
Today we will draw a hemoglobin A1c and your hemoglobin A1c is still under 6.5 we will not change the medications because that would indicate even though you were having some high readings her average readings are still in good control.  If however your hemoglobin A1c is greater than 6.5 we will increase her medication from the 5/500 to  5/850

## 2013-06-18 ENCOUNTER — Other Ambulatory Visit: Payer: Self-pay

## 2013-06-18 DIAGNOSIS — Z1231 Encounter for screening mammogram for malignant neoplasm of breast: Secondary | ICD-10-CM

## 2013-06-20 ENCOUNTER — Encounter: Payer: Self-pay | Admitting: Internal Medicine

## 2013-06-21 ENCOUNTER — Other Ambulatory Visit: Payer: Self-pay | Admitting: *Deleted

## 2013-07-12 ENCOUNTER — Ambulatory Visit
Admission: RE | Admit: 2013-07-12 | Discharge: 2013-07-12 | Disposition: A | Discharge status: Home / Self Care | Source: Ambulatory Visit

## 2013-07-12 DIAGNOSIS — Z1231 Encounter for screening mammogram for malignant neoplasm of breast: Secondary | ICD-10-CM

## 2013-08-30 ENCOUNTER — Other Ambulatory Visit: Payer: Self-pay

## 2013-11-08 ENCOUNTER — Encounter: Payer: Self-pay | Admitting: Family Medicine

## 2013-11-08 ENCOUNTER — Ambulatory Visit (INDEPENDENT_AMBULATORY_CARE_PROVIDER_SITE_OTHER): Admitting: Family Medicine

## 2013-11-08 ENCOUNTER — Ambulatory Visit: Admitting: Internal Medicine

## 2013-11-08 VITALS — BP 128/80 | HR 84 | Temp 98.2°F | Wt 197.0 lb

## 2013-11-08 DIAGNOSIS — R059 Cough, unspecified: Secondary | ICD-10-CM

## 2013-11-08 DIAGNOSIS — R05 Cough: Secondary | ICD-10-CM

## 2013-11-08 DIAGNOSIS — J189 Pneumonia, unspecified organism: Secondary | ICD-10-CM

## 2013-11-08 MED ORDER — HYDROCOD POLST-CHLORPHEN POLST 10-8 MG/5ML PO LQCR: 5.0000 mL | Freq: Two times a day (BID) | ORAL | Status: DC | PRN

## 2013-11-08 MED ORDER — DOXYCYCLINE HYCLATE 100 MG PO TABS: 100.0000 mg | ORAL_TABLET | Freq: Two times a day (BID) | ORAL | Status: DC

## 2013-11-08 NOTE — Progress Notes (Signed)
Pre visit review using our clinic review tool, if applicable. No additional management support is needed unless otherwise documented below in the visit note. 

## 2013-11-08 NOTE — Patient Instructions (Signed)
-  As we discussed, we have prescribed a new medication for you at this appointment. We discussed the common and serious potential adverse effects of this medication and you can review these and more with the pharmacist when you pick up your medication.  Please follow the instructions for use carefully and notify us immediately if you have any problems taking this medication.  -if worsening or not improving see a doctor right away

## 2013-11-08 NOTE — Progress Notes (Signed)
Chief Complaint  Patient presents with  . Cough    congestion, heaviness in chest     HPI:  -started: several weeks ago -symptoms:nasal congestion initiallay, productive cough worsening, wheezing, mild chest heaviness and pain when coughs on L back/side -denies:fever, NVD, tooth pain, weight loss, swelling, hemoptosis -has tried: OTC cough medication -sick contacts/travel/risks: denies flu exposure or Ebola risks  ROS: See pertinent positives and negatives per HPI.  Past Medical History  Diagnosis Date  . Hypertension   . Retinitis pigmentosa   . Hyperlipidemia   . Diabetes mellitus   . GERD (gastroesophageal reflux disease)   . Arthritis     Past Surgical History  Procedure Laterality Date  . Cesarean section    . Abdominal hysterectomy      Family History  Problem Relation Age of Onset  . Diabetes    . Hypertension      History   Social History  . Marital Status: Married    Spouse Name: N/A    Number of Children: N/A  . Years of Education: N/A   Social History Main Topics  . Smoking status: Never Smoker   . Smokeless tobacco: None  . Alcohol Use: No  . Drug Use: No  . Sexual Activity: None   Other Topics Concern  . None   Social History Narrative  . None    Current outpatient prescriptions:glucose blood test strip, 1 each by Other route as needed. Use as instructed , Disp: , Rfl: ;  lansoprazole (PREVACID) 15 MG capsule, Take 15 mg by mouth daily.  , Disp: , Rfl: ;  Linagliptin-Metformin HCl (JENTADUETO) 2.5-500 MG TABS, Take 1 tablet by mouth 2 (two) times daily., Disp: 60 tablet, Rfl: 11 losartan-hydrochlorothiazide (HYZAAR) 50-12.5 MG per tablet, Take 1 tablet by mouth daily., Disp: 90 tablet, Rfl: 3;  rosuvastatin (CRESTOR) 20 MG tablet, Take 1 tablet (20 mg total) by mouth as directed. Change to twice a week Monday and Friday, Disp: , Rfl: ;  chlorpheniramine-HYDROcodone (TUSSIONEX PENNKINETIC ER) 10-8 MG/5ML LQCR, Take 5 mLs by mouth every 12  (twelve) hours as needed for cough., Disp: 115 mL, Rfl: 0 doxycycline (VIBRA-TABS) 100 MG tablet, Take 1 tablet (100 mg total) by mouth 2 (two) times daily., Disp: 20 tablet, Rfl: 0  EXAM:  Filed Vitals:   11/08/13 1044  BP: 128/80  Pulse: 110  Temp: 98.2 F (36.8 C)    Body mass index is 33.8 kg/(m^2).  GENERAL: vitals reviewed and listed above, alert, oriented, appears well hydrated and in no acute distress  HEENT: atraumatic, conjunttiva clear, no obvious abnormalities on inspection of external nose and ears, normal appearance of ear canals and TMs, clear nasal congestion, mild post oropharyngeal erythema with PND, no tonsillar edema or exudate, no sinus TTP  NECK: no obvious masses on inspection  LUNGS: few rhonchi R base  CV: HRRR, no peripheral edema  MS: moves all extremities without noticeable abnormality  PSYCH: pleasant and cooperative, no obvious depression or anxiety  ASSESSMENT AND PLAN:  Discussed the following assessment and plan:  Cough - Plan: doxycycline (VIBRA-TABS) 100 MG tablet, chlorpheniramine-HYDROcodone (TUSSIONEX PENNKINETIC ER) 10-8 MG/5ML LQCR  CAP (community acquired pneumonia) - Plan: doxycycline (VIBRA-TABS) 100 MG tablet, chlorpheniramine-HYDROcodone (TUSSIONEX PENNKINETIC ER) 10-8 MG/5ML LQCR  -we discussed possible serious and likely etiologies, workup and treatment, treatment risks and return precautions -after this discussion, Abbeygail opted for tx of likely resp infection with abx and cough medication -of course, we advised Hydia  to return or  notify a doctor immediately if symptoms worsen or persist or new concerns arise.  .  -of course, we advised to return or notify a doctor immediately if symptoms worsen or persist or new concerns arise.    Patient Instructions  -As we discussed, we have prescribed a new medication for you at this appointment. We discussed the common and serious potential adverse effects of this medication and  you can review these and more with the pharmacist when you pick up your medication.  Please follow the instructions for use carefully and notify us immediately if you have any problems taking this medication.  -if worsening or not improving see a doctor right away      Jiah Bari, Jarrett Soho R.

## 2014-01-11 ENCOUNTER — Other Ambulatory Visit: Payer: Self-pay | Admitting: Internal Medicine

## 2014-02-10 ENCOUNTER — Other Ambulatory Visit: Payer: Self-pay | Admitting: Internal Medicine

## 2014-05-03 ENCOUNTER — Other Ambulatory Visit (INDEPENDENT_AMBULATORY_CARE_PROVIDER_SITE_OTHER)

## 2014-05-03 DIAGNOSIS — Z0000 Encounter for general adult medical examination without abnormal findings: Secondary | ICD-10-CM

## 2014-05-03 LAB — MICROALBUMIN / CREATININE URINE RATIO
CREATININE, U: 245.6 mg/dL
MICROALB UR: 0.7 mg/dL (ref 0.0–1.9)
Microalb Creat Ratio: 0.3 mg/g (ref 0.0–30.0)

## 2014-05-03 LAB — BASIC METABOLIC PANEL
BUN: 13 mg/dL (ref 6–23)
CO2: 29 mEq/L (ref 19–32)
Calcium: 9.8 mg/dL (ref 8.4–10.5)
Chloride: 106 mEq/L (ref 96–112)
Creatinine, Ser: 0.8 mg/dL (ref 0.4–1.2)
GFR: 98.53 mL/min (ref >60.00)
Glucose, Bld: 128 mg/dL — ABNORMAL HIGH (ref 70–99)
POTASSIUM: 3.8 meq/L (ref 3.5–5.1)
SODIUM: 141 meq/L (ref 135–145)

## 2014-05-03 LAB — CBC WITH DIFFERENTIAL/PLATELET
Basophils Absolute: 0 10*3/uL (ref 0.0–0.1)
Basophils Relative: 0.5 % (ref 0.0–3.0)
EOS PCT: 3.3 % (ref 0.0–5.0)
Eosinophils Absolute: 0.2 10*3/uL (ref 0.0–0.7)
HEMATOCRIT: 37.8 % (ref 36.0–46.0)
HEMOGLOBIN: 12.2 g/dL (ref 12.0–15.0)
LYMPHS ABS: 2.5 10*3/uL (ref 0.7–4.0)
LYMPHS PCT: 42.5 % (ref 12.0–46.0)
MCHC: 32.2 g/dL (ref 30.0–36.0)
MCV: 88 fl (ref 78.0–100.0)
MONOS PCT: 7.3 % (ref 3.0–12.0)
Monocytes Absolute: 0.4 10*3/uL (ref 0.1–1.0)
NEUTROS ABS: 2.8 10*3/uL (ref 1.4–7.7)
Neutrophils Relative %: 46.4 % (ref 43.0–77.0)
Platelets: 240 10*3/uL (ref 150.0–400.0)
RBC: 4.3 Mil/uL (ref 3.87–5.11)
RDW: 15.4 % (ref 11.5–15.5)
WBC: 5.9 10*3/uL (ref 4.0–10.5)

## 2014-05-03 LAB — HEPATIC FUNCTION PANEL
ALBUMIN: 4.1 g/dL (ref 3.5–5.2)
ALK PHOS: 65 U/L (ref 39–117)
ALT: 14 U/L (ref 0–35)
AST: 14 U/L (ref 0–37)
BILIRUBIN TOTAL: 0.5 mg/dL (ref 0.2–1.2)
Bilirubin, Direct: 0 mg/dL (ref 0.0–0.3)
Total Protein: 7.9 g/dL (ref 6.0–8.3)

## 2014-05-03 LAB — LIPID PANEL
CHOL/HDL RATIO: 3
CHOLESTEROL: 144 mg/dL (ref 0–200)
HDL: 44.7 mg/dL (ref >39.00)
LDL Cholesterol: 80 mg/dL (ref 0–99)
NonHDL: 99.3
TRIGLYCERIDES: 99 mg/dL (ref 0.0–149.0)
VLDL: 19.8 mg/dL (ref 0.0–40.0)

## 2014-05-03 LAB — POCT URINALYSIS DIPSTICK
Bilirubin, UA: NEGATIVE
GLUCOSE UA: NEGATIVE
KETONES UA: NEGATIVE
Leukocytes, UA: NEGATIVE
Nitrite, UA: NEGATIVE
Protein, UA: NEGATIVE
RBC UA: NEGATIVE
SPEC GRAV UA: 1.025
UROBILINOGEN UA: 1
pH, UA: 5.5

## 2014-05-03 LAB — HEMOGLOBIN A1C: HEMOGLOBIN A1C: 6.8 % — AB (ref 4.6–6.5)

## 2014-05-03 LAB — TSH: TSH: 1.88 u[IU]/mL (ref 0.35–4.50)

## 2014-05-10 ENCOUNTER — Ambulatory Visit (INDEPENDENT_AMBULATORY_CARE_PROVIDER_SITE_OTHER): Admitting: Internal Medicine

## 2014-05-10 ENCOUNTER — Encounter: Payer: Self-pay | Admitting: Internal Medicine

## 2014-05-10 VITALS — BP 114/78 | HR 80 | Ht 63.0 in | Wt 188.0 lb

## 2014-05-10 DIAGNOSIS — H5710 Ocular pain, unspecified eye: Secondary | ICD-10-CM

## 2014-05-10 DIAGNOSIS — H3552 Pigmentary retinal dystrophy: Secondary | ICD-10-CM

## 2014-05-10 DIAGNOSIS — E669 Obesity, unspecified: Secondary | ICD-10-CM | POA: Insufficient documentation

## 2014-05-10 DIAGNOSIS — Z0000 Encounter for general adult medical examination without abnormal findings: Secondary | ICD-10-CM

## 2014-05-10 DIAGNOSIS — M129 Arthropathy, unspecified: Secondary | ICD-10-CM

## 2014-05-10 DIAGNOSIS — M1990 Unspecified osteoarthritis, unspecified site: Secondary | ICD-10-CM

## 2014-05-10 DIAGNOSIS — H5711 Ocular pain, right eye: Secondary | ICD-10-CM

## 2014-05-10 MED ORDER — GLUCOSE BLOOD VI STRP: ORAL_STRIP | Status: AC

## 2014-05-10 NOTE — Patient Instructions (Signed)
The patient is instructed to continue all medications as prescribed. Schedule followup with check out clerk upon leaving the clinic  

## 2014-05-10 NOTE — Progress Notes (Signed)
Subjective:    Patient ID: Patricia Elliott, female    DOB: October 06, 1956, 58 y.o.   MRN: [id number]  HPI CPX for AA F with mild obesity and AODM CBG's in control with A1c of 6.8 Needs weight loss and exercise She KNOWS this but cannot actuate it  Visual loss?   Some sharp pain in eyes Hx of Retinitis  Needs to see opthamology  And pressures were normal      Review of Systems  Constitutional: Negative for activity change, appetite change and fatigue.  HENT: Negative for congestion, ear pain, postnasal drip and sinus pressure.   Eyes: Negative for redness and visual disturbance.  Respiratory: Negative for cough, shortness of breath and wheezing.   Gastrointestinal: Negative for abdominal pain and abdominal distention.  Genitourinary: Negative for dysuria, frequency and menstrual problem.  Musculoskeletal: Positive for arthralgias and joint swelling. Negative for myalgias and neck pain.       Hands  Skin: Negative for rash and wound.  Neurological: Negative for dizziness, weakness and headaches.  Hematological: Negative for adenopathy. Does not bruise/bleed easily.  Psychiatric/Behavioral: Negative for sleep disturbance and decreased concentration.   Past Medical History  Diagnosis Date  . Hypertension   . Retinitis pigmentosa   . Hyperlipidemia   . Diabetes mellitus   . GERD (gastroesophageal reflux disease)   . Arthritis     History   Social History  . Marital Status: Married    Spouse Name: N/A    Number of Children: N/A  . Years of Education: N/A   Occupational History  . Not on file.   Social History Main Topics  . Smoking status: Never Smoker   . Smokeless tobacco: Not on file  . Alcohol Use: No  . Drug Use: No  . Sexual Activity: Not on file   Other Topics Concern  . Not on file   Social History Narrative  . No narrative on file    Past Surgical History  Procedure Laterality Date  . Cesarean section    . Abdominal hysterectomy       Family History  Problem Relation Age of Onset  . Diabetes    . Hypertension      No Known Allergies  Current Outpatient Prescriptions on File Prior to Visit  Medication Sig Dispense Refill  . glucose blood test strip 1 each by Other route as needed. Use as instructed       . JENTADUETO 2.5-500 MG TABS take 1 tablet by mouth twice a day  60 tablet  11  . lansoprazole (PREVACID) 15 MG capsule Take 15 mg by mouth daily.        Marland Kitchen losartan-hydrochlorothiazide (HYZAAR) 50-12.5 MG per tablet take 1 tablet by mouth once daily  90 tablet  3  . rosuvastatin (CRESTOR) 20 MG tablet Take 1 tablet (20 mg total) by mouth as directed. Change to twice a week Monday and Friday       No current facility-administered medications on file prior to visit.    BP 114/78  Pulse 80  Ht 5\' 3"  (1.6 m)  Wt 188 lb (85.276 kg)  BMI 33.31 kg/m2  SpO2 98%        Objective:   Physical Exam  Constitutional: She is oriented to person, place, and time. She appears well-developed and well-nourished. No distress.  HENT:  Head: Normocephalic and atraumatic.  Right Ear: External ear normal.  Left Ear: External ear normal.  Nose: Nose normal.  Mouth/Throat: Oropharynx is clear  and moist.  Eyes: Conjunctivae and EOM are normal. Pupils are equal, round, and reactive to light.  Neck: Normal range of motion. Neck supple. No JVD present. No tracheal deviation present. No thyromegaly present.  Cardiovascular: Normal rate, regular rhythm, normal heart sounds and intact distal pulses.   No murmur heard. Pulmonary/Chest: Effort normal and breath sounds normal. She has no wheezes. She exhibits no tenderness.  Abdominal: Soft. Bowel sounds are normal.  Musculoskeletal: Normal range of motion. She exhibits no edema and no tenderness.  Lymphadenopathy:    She has no cervical adenopathy.  Neurological: She is alert and oriented to person, place, and time. She has normal reflexes. No cranial nerve deficit.  Skin: Skin  is warm and dry. She is not diaphoretic.  Psychiatric: She has a normal mood and affect. Her behavior is normal.          Assessment & Plan:   This is a routine wellness  examination for this patient . I reviewed all health maintenance protocols including mammography, colonoscopy, bone density Needed referrals were placed. Age and diagnosis  appropriate screening labs were ordered. Her immunization history was reviewed and appropriate vaccinations were ordered. Her current medications and allergies were reviewed and needed refills of her chronic medications were ordered. The plan for yearly health maintenance was discussed all orders and referrals were made as appropriate.  Refer back to opthamology  Sed rate

## 2014-05-10 NOTE — Progress Notes (Signed)
Pre visit review using our clinic review tool, if applicable. No additional management support is needed unless otherwise documented below in the visit note. 

## 2014-05-10 NOTE — Addendum Note (Signed)
Addended by: Ricard Dillon on: 05/10/2014 05:02 PM   Modules accepted: Orders

## 2014-05-11 LAB — SEDIMENTATION RATE: Sed Rate: 34 mm/hr — ABNORMAL HIGH (ref 0–22)

## 2014-05-13 LAB — ANA: ANA: NEGATIVE

## 2014-06-12 ENCOUNTER — Other Ambulatory Visit: Payer: Self-pay

## 2014-06-12 DIAGNOSIS — Z1231 Encounter for screening mammogram for malignant neoplasm of breast: Secondary | ICD-10-CM

## 2014-07-16 ENCOUNTER — Ambulatory Visit
Admission: RE | Admit: 2014-07-16 | Discharge: 2014-07-16 | Disposition: A | Discharge status: Home / Self Care | Source: Ambulatory Visit

## 2014-07-16 DIAGNOSIS — Z1231 Encounter for screening mammogram for malignant neoplasm of breast: Secondary | ICD-10-CM

## 2014-07-24 ENCOUNTER — Other Ambulatory Visit: Payer: Self-pay | Admitting: Geriatric Medicine

## 2014-07-24 MED ORDER — LINAGLIPTIN-METFORMIN HCL 2.5-500 MG PO TABS: ORAL_TABLET | ORAL | Status: DC

## 2014-07-24 MED ORDER — LOSARTAN POTASSIUM-HCTZ 50-12.5 MG PO TABS: ORAL_TABLET | ORAL | Status: DC

## 2014-08-06 LAB — HM DIABETES EYE EXAM

## 2014-08-07 ENCOUNTER — Encounter: Payer: Self-pay | Admitting: Internal Medicine

## 2014-08-09 ENCOUNTER — Other Ambulatory Visit: Payer: Self-pay

## 2014-11-11 ENCOUNTER — Ambulatory Visit (INDEPENDENT_AMBULATORY_CARE_PROVIDER_SITE_OTHER): Admitting: Internal Medicine

## 2014-11-11 VITALS — BP 112/64 | HR 72 | Temp 98.3°F | Ht 63.0 in | Wt 187.5 lb

## 2014-11-11 DIAGNOSIS — E669 Obesity, unspecified: Secondary | ICD-10-CM

## 2014-11-11 DIAGNOSIS — H3552 Pigmentary retinal dystrophy: Secondary | ICD-10-CM

## 2014-11-11 DIAGNOSIS — E119 Type 2 diabetes mellitus without complications: Secondary | ICD-10-CM

## 2014-11-11 DIAGNOSIS — E785 Hyperlipidemia, unspecified: Secondary | ICD-10-CM

## 2014-11-11 DIAGNOSIS — I10 Essential (primary) hypertension: Secondary | ICD-10-CM

## 2014-11-11 DIAGNOSIS — E118 Type 2 diabetes mellitus with unspecified complications: Secondary | ICD-10-CM

## 2014-11-11 NOTE — Progress Notes (Signed)
   Subjective:    Patient ID: Patricia Elliott, female    DOB: 01-13-1956, 59 y.o.   MRN: [id number]  HPI The patient is a 59 YO female who is coming in today to establish care. She has PMH of diabetes type 2, obesity, HTN, hyperlipidemia. She is doing well overall and stable on regimen. She knows that she should be exercising more and trying to lose some weight but she hasn't been able to start that yet. She denies hypoglycemia or hyperglycemia. 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 She is not able to adjust to changes in light quickly. She does drive only during the day and in familiar areas. Eventually she will lose her sight due to this condition.    Review of Systems  Constitutional: Negative for fever, activity change, appetite change, fatigue and unexpected weight change.  HENT: Negative.   Eyes: Positive for visual disturbance.  Respiratory: Negative for cough, chest tightness, shortness of breath and wheezing.   Cardiovascular: Negative for chest pain, palpitations and leg swelling.  Gastrointestinal: Negative for abdominal pain, diarrhea, constipation and abdominal distention.  Musculoskeletal: Negative for myalgias, back pain and arthralgias.  Skin: Negative.   Neurological: Negative for dizziness, weakness, light-headedness and headaches.  Psychiatric/Behavioral: Negative.       Objective:   Physical Exam  Constitutional: She is oriented to person, place, and time. She appears well-developed and well-nourished.  HENT:  Head: Normocephalic and atraumatic.  Eyes: EOM are normal.  Neck: Normal range of motion.  Cardiovascular: Normal rate and regular rhythm.   Pulmonary/Chest: Effort normal and breath sounds normal. No respiratory distress. She has no wheezes. She has no rales.  Abdominal: Soft. Bowel sounds are normal. She exhibits no distension. There is no tenderness. There is no rebound.  Musculoskeletal: She exhibits no edema.    Neurological: She is alert and oriented to person, place, and time. Coordination normal.  Skin: Skin is warm and dry.   Filed Vitals:   11/11/14 1351  BP: 112/64  Pulse: 72  Temp: 98.3 F (36.8 C)  TempSrc: Oral  Height: 5\' 3"  (1.6 m)  Weight: 187 lb 8 oz (85.049 kg)  SpO2: 97%      Assessment & Plan:

## 2014-11-11 NOTE — Patient Instructions (Signed)
We will check your blood work for diabetes and cholesterol today. We will call you back with the results.   You are doing well and we want you to work on finding some exercise that you can do and doing it about 3-4 times per week.   Come back in about 6 months to follow up on your diabetes.   Diabetes and Exercise Exercising regularly is important. It is not just about losing weight. It has many health benefits, such as:  Improving your overall fitness, flexibility, and endurance.  Increasing your bone density.  Helping with weight control.  Decreasing your body fat.  Increasing your muscle strength.  Reducing stress and tension.  Improving your overall health. People with diabetes who exercise gain additional benefits because exercise:  Reduces appetite.  Improves the body's use of blood sugar (glucose).  Helps lower or control blood glucose.  Decreases blood pressure.  Helps control blood lipids (such as cholesterol and triglycerides).  Improves the body's use of the hormone insulin by:  Increasing the body's insulin sensitivity.  Reducing the body's insulin needs.  Decreases the risk for heart disease because exercising:  Lowers cholesterol and triglycerides levels.  Increases the levels of good cholesterol (such as high-density lipoproteins [HDL]) in the body.  Lowers blood glucose levels. YOUR ACTIVITY PLAN  Choose an activity that you enjoy and set realistic goals. Your health care provider or diabetes educator can help you make an activity plan that works for you. Exercise regularly as directed by your health care provider. This includes:  Performing resistance training twice a week such as push-ups, sit-ups, lifting weights, or using resistance bands.  Performing 150 minutes of cardio exercises each week such as walking, running, or playing sports.  Staying active and spending no more than 90 minutes at one time being inactive. Even short bursts of  exercise are good for you. Three 10-minute sessions spread throughout the day are just as beneficial as a single 30-minute session. Some exercise ideas include:  Taking the dog for a walk.  Taking the stairs instead of the elevator.  Dancing to your favorite song.  Doing an exercise video.  Doing your favorite exercise with a friend. RECOMMENDATIONS FOR EXERCISING WITH TYPE 1 OR TYPE 2 DIABETES   Check your blood glucose before exercising. If blood glucose levels are greater than 240 mg/dL, check for urine ketones. Do not exercise if ketones are present.  Avoid injecting insulin into areas of the body that are going to be exercised. For example, avoid injecting insulin into:  The arms when playing tennis.  The legs when jogging.  Keep a record of:  Food intake before and after you exercise.  Expected peak times of insulin action.  Blood glucose levels before and after you exercise.  The type and amount of exercise you have done.  Review your records with your health care provider. Your health care provider will help you to develop guidelines for adjusting food intake and insulin amounts before and after exercising.  If you take insulin or oral hypoglycemic agents, watch for signs and symptoms of hypoglycemia. They include:  Dizziness.  Shaking.  Sweating.  Chills.  Confusion.  Drink plenty of water while you exercise to prevent dehydration or heat stroke. Body water is lost during exercise and must be replaced.  Talk to your health care provider before starting an exercise program to make sure it is safe for you. Remember, almost any type of activity is better than none. Document Released:  01/01/2004 Document Revised: 02/25/2014 Document Reviewed: 03/20/2013 [facility] Patient Information 2015 [place], [place]. This information is not intended to replace advice given to you by your health care provider. Make sure you discuss any questions you have with your health care  provider.

## 2014-11-11 NOTE — Progress Notes (Signed)
Pre visit review using our clinic review tool, if applicable. No additional management support is needed unless otherwise documented below in the visit note. 

## 2014-11-12 ENCOUNTER — Encounter: Payer: Self-pay | Admitting: Internal Medicine

## 2014-11-12 NOTE — Assessment & Plan Note (Signed)
She is currently on jentadueto. Check Hga1c today, talked with her about flu shot and pneumonia shot. She wants to think about the pneumonia shot and does not want the flu shot.

## 2014-11-12 NOTE — Assessment & Plan Note (Signed)
Her BP is well controlled on losartan/hctz. Order BMP.

## 2014-11-12 NOTE — Assessment & Plan Note (Signed)
Currently on crestor and will check lipid panel to ensure at goal.

## 2014-11-12 NOTE — Assessment & Plan Note (Signed)
She will try to start exercising and work on watching her diet.

## 2014-11-12 NOTE — Assessment & Plan Note (Signed)
She is continuing to see her eye doctor, her eyesight is becoming worse.

## 2014-11-13 ENCOUNTER — Other Ambulatory Visit (INDEPENDENT_AMBULATORY_CARE_PROVIDER_SITE_OTHER)

## 2014-11-13 DIAGNOSIS — E118 Type 2 diabetes mellitus with unspecified complications: Secondary | ICD-10-CM

## 2014-11-13 LAB — HEMOGLOBIN A1C: HEMOGLOBIN A1C: 6.9 % — AB (ref 4.6–6.5)

## 2014-11-13 LAB — LIPID PANEL
CHOL/HDL RATIO: 4
Cholesterol: 159 mg/dL (ref 0–200)
HDL: 41.1 mg/dL (ref >39.00)
LDL CALC: 97 mg/dL (ref 0–99)
NonHDL: 117.9
TRIGLYCERIDES: 107 mg/dL (ref 0.0–149.0)
VLDL: 21.4 mg/dL (ref 0.0–40.0)

## 2014-11-13 LAB — BASIC METABOLIC PANEL
BUN: 15 mg/dL (ref 6–23)
CHLORIDE: 104 meq/L (ref 96–112)
CO2: 29 meq/L (ref 19–32)
Calcium: 9.7 mg/dL (ref 8.4–10.5)
Creatinine, Ser: 0.89 mg/dL (ref 0.40–1.20)
GFR: 83.21 mL/min (ref >60.00)
Glucose, Bld: 139 mg/dL — ABNORMAL HIGH (ref 70–99)
POTASSIUM: 3.9 meq/L (ref 3.5–5.1)
Sodium: 139 mEq/L (ref 135–145)

## 2014-12-03 ENCOUNTER — Telehealth: Payer: Self-pay | Admitting: Internal Medicine

## 2014-12-03 NOTE — Telephone Encounter (Signed)
Rec'd from [facility] forward 11 pages to Dr. Doug Sou

## 2014-12-31 ENCOUNTER — Telehealth: Payer: Self-pay | Admitting: Internal Medicine

## 2014-12-31 NOTE — Telephone Encounter (Signed)
Received records from [place], sent to Dr. Arnoldo Morale. 12/31/14/ss

## 2015-05-12 ENCOUNTER — Ambulatory Visit: Admitting: Internal Medicine

## 2015-05-22 ENCOUNTER — Encounter: Payer: Self-pay | Admitting: Internal Medicine

## 2015-05-22 ENCOUNTER — Other Ambulatory Visit (INDEPENDENT_AMBULATORY_CARE_PROVIDER_SITE_OTHER)

## 2015-05-22 ENCOUNTER — Ambulatory Visit (INDEPENDENT_AMBULATORY_CARE_PROVIDER_SITE_OTHER): Admitting: Internal Medicine

## 2015-05-22 VITALS — BP 132/82 | HR 78 | Temp 98.5°F | Resp 16 | Ht 63.0 in | Wt 187.8 lb

## 2015-05-22 DIAGNOSIS — R05 Cough: Secondary | ICD-10-CM | POA: Insufficient documentation

## 2015-05-22 DIAGNOSIS — R059 Cough, unspecified: Secondary | ICD-10-CM | POA: Insufficient documentation

## 2015-05-22 DIAGNOSIS — E119 Type 2 diabetes mellitus without complications: Secondary | ICD-10-CM

## 2015-05-22 LAB — HEMOGLOBIN A1C: Hgb A1c MFr Bld: 6.8 % — ABNORMAL HIGH (ref 4.6–6.5)

## 2015-05-22 MED ORDER — BENZONATATE 100 MG PO CAPS: 100.0000 mg | ORAL_CAPSULE | Freq: Two times a day (BID) | ORAL | Status: DC | PRN

## 2015-05-22 NOTE — Assessment & Plan Note (Signed)
Continue jentadueto. Check HgA1c. Foot exam done today. Check microalbumin to creatinine ratio next week as she thinks it has been done in the last year. No complications.

## 2015-05-22 NOTE — Assessment & Plan Note (Signed)
Likely post-viral. Rx for tessalone perles. No indication for CXR or antibiotics today.

## 2015-05-22 NOTE — Progress Notes (Signed)
   Subjective:    Patient ID: Patricia Elliott, female    DOB: 1956/06/06, 59 y.o.   MRN: [id number]  HPI The patient is a 59 YO female who is coming in with an acute complaint of cough. She had a mild virus last week and had nasal congestion and drainage. She denies fever or chills. She did use chloracedent which helped and now she is only having cough. Anytime of day. No congestion anymore. No fevers or chills. No SOB. No sputum. See A/P for status and updates of chronic medical conditions.   Review of Systems  Constitutional: Negative for fever, activity change, appetite change, fatigue and unexpected weight change.  HENT: Negative for congestion, facial swelling, postnasal drip, rhinorrhea, sinus pressure and sore throat.   Eyes: Positive for visual disturbance.  Respiratory: Positive for cough. Negative for chest tightness, shortness of breath and wheezing.   Cardiovascular: Negative for chest pain, palpitations and leg swelling.  Gastrointestinal: Negative for abdominal pain, diarrhea, constipation and abdominal distention.  Musculoskeletal: Negative for myalgias, back pain and arthralgias.  Neurological: Negative for dizziness, weakness, light-headedness and headaches.      Objective:   Physical Exam  Constitutional: She is oriented to person, place, and time. She appears well-developed and well-nourished.  HENT:  Head: Normocephalic and atraumatic.  Eyes: EOM are normal.  Neck: Normal range of motion.  Cardiovascular: Normal rate and regular rhythm.   Pulmonary/Chest: Effort normal and breath sounds normal. No respiratory distress. She has no wheezes. She has no rales.  Abdominal: Soft. Bowel sounds are normal. She exhibits no distension. There is no tenderness. There is no rebound.  Musculoskeletal: She exhibits no edema.  Neurological: She is alert and oriented to person, place, and time. Coordination normal.  Skin: Skin is warm and dry.   Filed Vitals:   05/22/15 1312  BP:  132/82  Pulse: 78  Temp: 98.5 F (36.9 C)  TempSrc: Oral  Resp: 16  Height: 5\' 3"  (1.6 m)  Weight: 187 lb 12.8 oz (85.186 kg)  SpO2: 98%      Assessment & Plan:

## 2015-05-22 NOTE — Patient Instructions (Signed)
We have sent in some tessalon perles which help the brain to need to cough less. You can take 1 up to 3 times per day.   In all likelihood the coughing will be gone within the next 1-2 weeks.   We are checking the blood work today for the diabetes.   Diabetes and Standards of Medical Care Diabetes is complicated. You may find that your diabetes team includes a dietitian, nurse, diabetes educator, eye doctor, and more. To help everyone know what is going on and to help you get the care you deserve, the following schedule of care was developed to help keep you on track. Below are the tests, exams, vaccines, medicines, education, and plans you will need. HbA1c test This test shows how well you have controlled your glucose over the past 2-3 months. It is used to see if your diabetes management plan needs to be adjusted.   It is performed at least 2 times a year if you are meeting treatment goals.  It is performed 4 times a year if therapy has changed or if you are not meeting treatment goals. Blood pressure test  This test is performed at every routine medical visit. The goal is less than 140/90 mm Hg for most people, but 130/80 mm Hg in some cases. Ask your health care provider about your goal. Dental exam  Follow up with the dentist regularly. Eye exam  If you are diagnosed with type 1 diabetes as a child, get an exam upon reaching the age of 57 years or older and have had diabetes for 3-5 years. Yearly eye exams are recommended after that initial eye exam.  If you are diagnosed with type 1 diabetes as an adult, get an exam within 5 years of diagnosis and then yearly.  If you are diagnosed with type 2 diabetes, get an exam as soon as possible after the diagnosis and then yearly. Foot care exam  Visual foot exams are performed at every routine medical visit. The exams check for cuts, injuries, or other problems with the feet.  A comprehensive foot exam should be done yearly. This includes  visual inspection as well as assessing foot pulses and testing for loss of sensation.  Check your feet nightly for cuts, injuries, or other problems with your feet. Tell your health care provider if anything is not healing. Kidney function test (urine microalbumin)  This test is performed once a year.  Type 1 diabetes: The first test is performed 5 years after diagnosis.  Type 2 diabetes: The first test is performed at the time of diagnosis.  A serum creatinine and estimated glomerular filtration rate (eGFR) test is done once a year to assess the level of chronic kidney disease (CKD), if present. Lipid profile (cholesterol, HDL, LDL, triglycerides)  Performed every 5 years for most people.  The goal for LDL is less than 100 mg/dL. If you are at high risk, the goal is less than 70 mg/dL.  The goal for HDL is 40 mg/dL-50 mg/dL for men and 50 mg/dL-60 mg/dL for women. An HDL cholesterol of 60 mg/dL or higher gives some protection against heart disease.  The goal for triglycerides is less than 150 mg/dL. Influenza vaccine, pneumococcal vaccine, and hepatitis B vaccine  The influenza vaccine is recommended yearly.  It is recommended that people with diabetes who are over 62 years old get the pneumonia vaccine. In some cases, two separate shots may be given. Ask your health care provider if your pneumonia vaccination  is up to date.  The hepatitis B vaccine is also recommended for adults with diabetes. Diabetes self-management education  Education is recommended at diagnosis and ongoing as needed. Treatment plan  Your treatment plan is reviewed at every medical visit. Document Released: 08/08/2009 Document Revised: 02/25/2014 Document Reviewed: 03/13/2013 [facility] Patient Information 2015 [place], [place]. This information is not intended to replace advice given to you by your health care provider. Make sure you discuss any questions you have with your health care provider.

## 2015-05-22 NOTE — Progress Notes (Signed)
Pre visit review using our clinic review tool, if applicable. No additional management support is needed unless otherwise documented below in the visit note. 

## 2015-06-10 ENCOUNTER — Other Ambulatory Visit: Payer: Self-pay | Admitting: Internal Medicine

## 2015-07-22 ENCOUNTER — Other Ambulatory Visit: Payer: Self-pay

## 2015-07-22 DIAGNOSIS — Z1231 Encounter for screening mammogram for malignant neoplasm of breast: Secondary | ICD-10-CM

## 2015-07-29 ENCOUNTER — Ambulatory Visit
Admission: RE | Admit: 2015-07-29 | Discharge: 2015-07-29 | Disposition: A | Discharge status: Home / Self Care | Payer: Medicare Other | Source: Ambulatory Visit

## 2015-07-29 DIAGNOSIS — Z1231 Encounter for screening mammogram for malignant neoplasm of breast: Secondary | ICD-10-CM | POA: Diagnosis not present

## 2015-08-13 ENCOUNTER — Other Ambulatory Visit: Payer: Self-pay | Admitting: Internal Medicine

## 2015-08-14 DIAGNOSIS — H3552 Pigmentary retinal dystrophy: Secondary | ICD-10-CM | POA: Diagnosis not present

## 2015-08-14 DIAGNOSIS — E119 Type 2 diabetes mellitus without complications: Secondary | ICD-10-CM | POA: Diagnosis not present

## 2015-08-14 DIAGNOSIS — H2513 Age-related nuclear cataract, bilateral: Secondary | ICD-10-CM | POA: Diagnosis not present

## 2015-11-14 DIAGNOSIS — H2513 Age-related nuclear cataract, bilateral: Secondary | ICD-10-CM | POA: Diagnosis not present

## 2015-11-27 ENCOUNTER — Other Ambulatory Visit (INDEPENDENT_AMBULATORY_CARE_PROVIDER_SITE_OTHER): Payer: Medicare Other

## 2015-11-27 ENCOUNTER — Ambulatory Visit (INDEPENDENT_AMBULATORY_CARE_PROVIDER_SITE_OTHER): Payer: Medicare Other | Admitting: Internal Medicine

## 2015-11-27 ENCOUNTER — Encounter: Payer: Self-pay | Admitting: Internal Medicine

## 2015-11-27 VITALS — BP 138/82 | HR 81 | Temp 98.5°F | Resp 16 | Ht 64.0 in | Wt 184.4 lb

## 2015-11-27 DIAGNOSIS — M792 Neuralgia and neuritis, unspecified: Secondary | ICD-10-CM

## 2015-11-27 DIAGNOSIS — E119 Type 2 diabetes mellitus without complications: Secondary | ICD-10-CM

## 2015-11-27 LAB — COMPREHENSIVE METABOLIC PANEL
ALBUMIN: 4.4 g/dL (ref 3.5–5.2)
ALK PHOS: 70 U/L (ref 39–117)
ALT: 11 U/L (ref 0–35)
AST: 11 U/L (ref 0–37)
BILIRUBIN TOTAL: 0.3 mg/dL (ref 0.2–1.2)
BUN: 11 mg/dL (ref 6–23)
CO2: 29 mEq/L (ref 19–32)
CREATININE: 0.79 mg/dL (ref 0.40–1.20)
Calcium: 9.7 mg/dL (ref 8.4–10.5)
Chloride: 105 mEq/L (ref 96–112)
GFR: 95.15 mL/min (ref >60.00)
GLUCOSE: 108 mg/dL — AB (ref 70–99)
Potassium: 3.9 mEq/L (ref 3.5–5.1)
SODIUM: 141 meq/L (ref 135–145)
TOTAL PROTEIN: 8.1 g/dL (ref 6.0–8.3)

## 2015-11-27 LAB — LIPID PANEL
CHOLESTEROL: 150 mg/dL (ref 0–200)
HDL: 48.7 mg/dL (ref >39.00)
LDL Cholesterol: 86 mg/dL (ref 0–99)
NONHDL: 101.23
Total CHOL/HDL Ratio: 3
Triglycerides: 74 mg/dL (ref 0.0–149.0)
VLDL: 14.8 mg/dL (ref 0.0–40.0)

## 2015-11-27 LAB — HEMOGLOBIN A1C: HEMOGLOBIN A1C: 6.4 % (ref 4.6–6.5)

## 2015-11-27 MED ORDER — GABAPENTIN 300 MG PO CAPS: 300.0000 mg | ORAL_CAPSULE | Freq: Every day | ORAL | Status: DC

## 2015-11-27 NOTE — Patient Instructions (Signed)
We have sent in gabapentin for you to take before bedtime for the pain. You can also try heat on the area.   We are checking the labs today and will also check the MRI of the neck.

## 2015-11-27 NOTE — Progress Notes (Signed)
Pre visit review using our clinic review tool, if applicable. No additional management support is needed unless otherwise documented below in the visit note. 

## 2015-11-28 DIAGNOSIS — M792 Neuralgia and neuritis, unspecified: Secondary | ICD-10-CM | POA: Insufficient documentation

## 2015-11-28 NOTE — Assessment & Plan Note (Signed)
Ordered MRI cervical spine as she has never had imaging of this before. Rx for gabapentin for pain.

## 2015-11-28 NOTE — Progress Notes (Signed)
   Subjective:    Patient ID: Patricia Elliott, female    DOB: 12/09/1955, 60 y.o.   MRN: [id number]  HPI The patient is a 60 YO female coming in for pain in her arm that seems to start in the neck. She has previously had some problems with neck pain. The pain is sharp and radiates into her fingers. No pain in her chest or jaw. No sweating with the pain. Worse with certain sleeping positions. She has tried aleve and tylenol which have not helped. Has not tried heat or ice. No weakness in the arms and no numbness or weakness anywhere else.   Review of Systems  Constitutional: Negative for fever, activity change, appetite change, fatigue and unexpected weight change.  HENT: Negative for congestion, facial swelling, postnasal drip, rhinorrhea, sinus pressure and sore throat.   Respiratory: Negative for cough, chest tightness, shortness of breath and wheezing.   Cardiovascular: Negative for chest pain, palpitations and leg swelling.  Gastrointestinal: Negative for abdominal pain, diarrhea, constipation and abdominal distention.  Musculoskeletal: Positive for myalgias and neck pain. Negative for back pain and arthralgias.  Neurological: Negative for dizziness, weakness, light-headedness and headaches.      Objective:   Physical Exam  Constitutional: She is oriented to person, place, and time. She appears well-developed and well-nourished.  HENT:  Head: Normocephalic and atraumatic.  Eyes: EOM are normal.  Neck: Normal range of motion.  Cardiovascular: Normal rate and regular rhythm.   Pulmonary/Chest: Effort normal and breath sounds normal. No respiratory distress. She has no wheezes. She has no rales.  Abdominal: Soft. Bowel sounds are normal. She exhibits no distension. There is no tenderness. There is no rebound.  Musculoskeletal: She exhibits no edema.  No pain to palpation in the shoulder, some tenderness in the neck. No numbness or weakness in the upper arms bilaterally.   Neurological:  She is alert and oriented to person, place, and time. No cranial nerve deficit. Coordination normal.  Skin: Skin is warm and dry.   Filed Vitals:   11/27/15 1305  BP: 138/82  Pulse: 81  Temp: 98.5 F (36.9 C)  TempSrc: Oral  Resp: 16  Height: 5\' 4"  (1.626 m)  Weight: 184 lb 6.4 oz (83.643 kg)  SpO2: 97%      Assessment & Plan:

## 2015-12-21 ENCOUNTER — Ambulatory Visit
Admission: RE | Admit: 2015-12-21 | Discharge: 2015-12-21 | Disposition: A | Discharge status: Home / Self Care | Payer: Medicare Other | Source: Ambulatory Visit | Attending: Internal Medicine | Admitting: Internal Medicine

## 2015-12-21 DIAGNOSIS — M50223 Other cervical disc displacement at C6-C7 level: Secondary | ICD-10-CM | POA: Diagnosis not present

## 2015-12-21 DIAGNOSIS — M50222 Other cervical disc displacement at C5-C6 level: Secondary | ICD-10-CM | POA: Diagnosis not present

## 2015-12-21 DIAGNOSIS — M50221 Other cervical disc displacement at C4-C5 level: Secondary | ICD-10-CM | POA: Diagnosis not present

## 2015-12-21 DIAGNOSIS — M792 Neuralgia and neuritis, unspecified: Secondary | ICD-10-CM

## 2016-03-04 ENCOUNTER — Other Ambulatory Visit: Payer: Self-pay | Admitting: Internal Medicine

## 2016-03-30 ENCOUNTER — Ambulatory Visit (INDEPENDENT_AMBULATORY_CARE_PROVIDER_SITE_OTHER)
Admission: RE | Admit: 2016-03-30 | Discharge: 2016-03-30 | Disposition: A | Discharge status: Home / Self Care | Payer: Medicare Other | Source: Ambulatory Visit | Attending: Internal Medicine | Admitting: Internal Medicine

## 2016-03-30 ENCOUNTER — Encounter: Payer: Self-pay | Admitting: Internal Medicine

## 2016-03-30 ENCOUNTER — Ambulatory Visit (INDEPENDENT_AMBULATORY_CARE_PROVIDER_SITE_OTHER): Payer: Medicare Other | Admitting: Internal Medicine

## 2016-03-30 VITALS — BP 110/68 | HR 71 | Temp 98.4°F | Ht 64.0 in | Wt 183.0 lb

## 2016-03-30 DIAGNOSIS — M5431 Sciatica, right side: Secondary | ICD-10-CM | POA: Insufficient documentation

## 2016-03-30 DIAGNOSIS — M47816 Spondylosis without myelopathy or radiculopathy, lumbar region: Secondary | ICD-10-CM | POA: Diagnosis not present

## 2016-03-30 MED ORDER — CYCLOBENZAPRINE HCL 5 MG PO TABS: 5.0000 mg | ORAL_TABLET | Freq: Three times a day (TID) | ORAL | Status: DC | PRN

## 2016-03-30 NOTE — Assessment & Plan Note (Signed)
She will continue using gabapentin and added flexeril for her leg pain. Checking x-ray low back for change from 2013. No known trigger. Will avoid prednisone burst as was not helpful in 2013 and weight is already high.

## 2016-03-30 NOTE — Patient Instructions (Signed)
We will check the x-ray of the low back to check out the new pain.  We have sent in flexeril for the pain which you can take up to 3 times a day for pain. It is okay to take this with the gabapentin in the evening to help with the pain.   Stay off the crestor until August we will recheck the cholesterol then.

## 2016-03-30 NOTE — Progress Notes (Signed)
   Subjective:    Patient ID: Patricia Elliott, female    DOB: 1956/09/13, 60 y.o.   MRN: [id number]  HPI The patient is a 60 YO female coming in for low back pain and leg pain for 3 weeks. She tried taking her gabapentin and is not sure if it helps. It reminds her of several years ago when she had a nerve pinched in the low back and had pains in her left leg. This is gradually improving over the last several weeks. She has also tried ibuprofen which did not help much. Previous MRI low back with some degeneration and some bulging discs (2013). No injury or overuse that she can recall. She woke up with it. 5-6/10 now.   Review of Systems  Constitutional: Negative for fever, activity change, appetite change, fatigue and unexpected weight change.  HENT: Negative for congestion, facial swelling, postnasal drip, rhinorrhea, sinus pressure and sore throat.   Respiratory: Negative for cough, chest tightness, shortness of breath and wheezing.   Cardiovascular: Negative for chest pain, palpitations and leg swelling.  Gastrointestinal: Negative for abdominal pain, diarrhea, constipation and abdominal distention.  Musculoskeletal: Positive for myalgias and back pain. Negative for arthralgias and gait problem.  Neurological: Negative for dizziness, weakness, light-headedness and headaches.      Objective:   Physical Exam  Constitutional: She is oriented to person, place, and time. She appears well-developed and well-nourished.  HENT:  Head: Normocephalic and atraumatic.  Eyes: EOM are normal.  Neck: Normal range of motion.  Cardiovascular: Normal rate and regular rhythm.   Pulmonary/Chest: Effort normal and breath sounds normal. No respiratory distress. She has no wheezes. She has no rales.  Abdominal: Soft. Bowel sounds are normal. She exhibits no distension. There is no tenderness. There is no rebound.  Musculoskeletal: She exhibits no edema.  No pain to palpation over entire spine, none in the hip  region, no swelling in the calves or numbness or weakness in right leg  Neurological: She is alert and oriented to person, place, and time. No cranial nerve deficit. Coordination normal.  Skin: Skin is warm and dry.   Filed Vitals:   03/30/16 0832  BP: 110/68  Pulse: 71  Temp: 98.4 F (36.9 C)  TempSrc: Oral  Height: 5\' 4"  (1.626 m)  Weight: 183 lb (83.008 kg)  SpO2: 98%      Assessment & Plan:

## 2016-03-30 NOTE — Progress Notes (Signed)
Pre visit review using our clinic review tool, if applicable. No additional management support is needed unless otherwise documented below in the visit note. 

## 2016-05-10 ENCOUNTER — Other Ambulatory Visit: Payer: Self-pay | Admitting: Internal Medicine

## 2016-05-26 ENCOUNTER — Other Ambulatory Visit (INDEPENDENT_AMBULATORY_CARE_PROVIDER_SITE_OTHER): Payer: Medicare Other

## 2016-05-26 ENCOUNTER — Encounter: Payer: Self-pay | Admitting: Internal Medicine

## 2016-05-26 ENCOUNTER — Ambulatory Visit (INDEPENDENT_AMBULATORY_CARE_PROVIDER_SITE_OTHER): Payer: Medicare Other | Admitting: Internal Medicine

## 2016-05-26 DIAGNOSIS — M5431 Sciatica, right side: Secondary | ICD-10-CM | POA: Diagnosis not present

## 2016-05-26 DIAGNOSIS — E119 Type 2 diabetes mellitus without complications: Secondary | ICD-10-CM

## 2016-05-26 LAB — COMPREHENSIVE METABOLIC PANEL
ALBUMIN: 4.2 g/dL (ref 3.5–5.2)
ALK PHOS: 69 U/L (ref 39–117)
ALT: 15 U/L (ref 0–35)
AST: 12 U/L (ref 0–37)
BILIRUBIN TOTAL: 0.4 mg/dL (ref 0.2–1.2)
BUN: 11 mg/dL (ref 6–23)
CALCIUM: 9.8 mg/dL (ref 8.4–10.5)
CO2: 25 mEq/L (ref 19–32)
CREATININE: 0.79 mg/dL (ref 0.40–1.20)
Chloride: 103 mEq/L (ref 96–112)
GFR: 94.99 mL/min (ref >60.00)
Glucose, Bld: 117 mg/dL — ABNORMAL HIGH (ref 70–99)
Potassium: 3.7 mEq/L (ref 3.5–5.1)
SODIUM: 139 meq/L (ref 135–145)
TOTAL PROTEIN: 8.1 g/dL (ref 6.0–8.3)

## 2016-05-26 LAB — HEMOGLOBIN A1C: Hgb A1c MFr Bld: 6.6 % — ABNORMAL HIGH (ref 4.6–6.5)

## 2016-05-26 MED ORDER — TRAMADOL HCL 50 MG PO TABS: 50.0000 mg | ORAL_TABLET | Freq: Two times a day (BID) | ORAL | 0 refills | Status: DC | PRN

## 2016-05-26 MED ORDER — GABAPENTIN 300 MG PO CAPS: 300.0000 mg | ORAL_CAPSULE | Freq: Every day | ORAL | 3 refills | Status: DC

## 2016-05-26 NOTE — Progress Notes (Signed)
   Subjective:    Patient ID: Patricia Elliott, female    DOB: Apr 13, 1956, 60 y.o.   MRN: [id number]  HPI The patient is a 60 YO female coming in for follow up of diabetes (taking jentadueto and ARB, not complicated) and follow up of her pain in the back going to the right leg (we tried flexeril for it and she has tried this with some relief, now mostly in the low leg and not so much in the back).   Review of Systems  Constitutional: Negative for activity change, appetite change, fatigue, fever and unexpected weight change.  HENT: Negative for congestion, facial swelling, postnasal drip, rhinorrhea, sinus pressure and sore throat.   Respiratory: Negative for cough, chest tightness, shortness of breath and wheezing.   Cardiovascular: Negative for chest pain, palpitations and leg swelling.  Gastrointestinal: Negative for abdominal distention, abdominal pain, constipation and diarrhea.  Musculoskeletal: Positive for back pain and myalgias. Negative for arthralgias and gait problem.  Neurological: Negative for dizziness, weakness, light-headedness and headaches.      Objective:   Physical Exam  Constitutional: She is oriented to person, place, and time. She appears well-developed and well-nourished.  HENT:  Head: Normocephalic and atraumatic.  Eyes: EOM are normal.  Neck: Normal range of motion.  Cardiovascular: Normal rate and regular rhythm.   Pulmonary/Chest: Effort normal and breath sounds normal. No respiratory distress. She has no wheezes. She has no rales.  Abdominal: Soft. Bowel sounds are normal. She exhibits no distension. There is no tenderness. There is no rebound.  Musculoskeletal: She exhibits no edema.  Neurological: She is alert and oriented to person, place, and time. No cranial nerve deficit. Coordination normal.  Skin: Skin is warm and dry.   Vitals:   05/26/16 0813  BP: 130/84  Pulse: 81  Resp: 16  Temp: 98 F (36.7 C)  TempSrc: Oral  SpO2: 97%  Weight: 180 lb  (81.6 kg)  Height: 5\' 3"  (1.6 m)      Assessment & Plan:

## 2016-05-26 NOTE — Assessment & Plan Note (Signed)
Previous HgA1c at goal, checking HgA1c today. Foot exam done today. Reminded about yearly eye exam. Taking jentadueto and ARB. Last LDL at goal off statin.

## 2016-05-26 NOTE — Patient Instructions (Signed)
We have sent in tramadol to your pharmacy to try for the pain in the legs. You can take 1 pill up to twice a day.   We are checking the labs today and will call you back with the results.   Diabetes and Standards of Medical Care Diabetes is complicated. You may find that your diabetes team includes a dietitian, nurse, diabetes educator, eye doctor, and more. To help everyone know what is going on and to help you get the care you deserve, the following schedule of care was developed to help keep you on track. Below are the tests, exams, vaccines, medicines, education, and plans you will need. HbA1c test This test shows how well you have controlled your glucose over the past 2-3 months. It is used to see if your diabetes management plan needs to be adjusted.   It is performed at least 2 times a year if you are meeting treatment goals.  It is performed 4 times a year if therapy has changed or if you are not meeting treatment goals. Blood pressure test  This test is performed at every routine medical visit. The goal is less than 140/90 mm Hg for most people, but 130/80 mm Hg in some cases. Ask your health care provider about your goal. Dental exam  Follow up with the dentist regularly. Eye exam  If you are diagnosed with type 1 diabetes as a child, get an exam upon reaching the age of 22 years or older and having had diabetes for 3-5 years. Yearly eye exams are recommended after that initial eye exam.  If you are diagnosed with type 1 diabetes as an adult, get an exam within 5 years of diagnosis and then yearly.  If you are diagnosed with type 2 diabetes, get an exam as soon as possible after the diagnosis and then yearly. Foot care exam  Visual foot exams are performed at every routine medical visit. The exams check for cuts, injuries, or other problems with the feet.  You should have a complete foot exam performed every year. This exam includes an inspection of the structure and skin of  your feet, a check of the pulses in your feet, and a check of the sensation in your feet.  Type 1 diabetes: The first exam is performed 5 years after diagnosis.  Type 2 diabetes: The first exam is performed at the time of diagnosis.  Check your feet nightly for cuts, injuries, or other problems with your feet. Tell your health care provider if anything is not healing. Kidney function test (urine microalbumin)  This test is performed once a year.  Type 1 diabetes: The first test is performed 5 years after diagnosis.  Type 2 diabetes: The first test is performed at the time of diagnosis.  A serum creatinine and estimated glomerular filtration rate (eGFR) test is done once a year to assess the level of chronic kidney disease (CKD), if present. Lipid profile (cholesterol, HDL, LDL, triglycerides)  Performed every 5 years for most people.  The goal for LDL is less than 100 mg/dL. If you are at high risk, the goal is less than 70 mg/dL.  The goal for HDL is 40 mg/dL-50 mg/dL for men and 50 mg/dL-60 mg/dL for women. An HDL cholesterol of 60 mg/dL or higher gives some protection against heart disease.  The goal for triglycerides is less than 150 mg/dL. Immunizations  The flu (influenza) vaccine is recommended yearly for every person 48 months of age or older who  has diabetes.  The pneumonia (pneumococcal) vaccine is recommended for every person 65 years of age or older who has diabetes. Adults 56 years of age or older may receive the pneumonia vaccine as a series of two separate shots.  The hepatitis B vaccine is recommended for adults shortly after they have been diagnosed with diabetes.  The Tdap (tetanus, diphtheria, and pertussis) vaccine should be given:  According to normal childhood vaccination schedules, for children.  Every 10 years, for adults who have diabetes. Diabetes self-management education  Education is recommended at diagnosis and ongoing as needed. Treatment  plan  Your treatment plan is reviewed at every medical visit.   This information is not intended to replace advice given to you by your health care provider. Make sure you discuss any questions you have with your health care provider.   Document Released: 08/08/2009 Document Revised: 11/01/2014 Document Reviewed: 03/13/2013 Elsevier Interactive Patient Education [employer].

## 2016-05-26 NOTE — Assessment & Plan Note (Addendum)
Pain is now gone from the low back and hip. She has failed flexeril which is not refilled today. Rx for tramadol for the low leg and referral to neurosurgery if that is not helpful.

## 2016-05-26 NOTE — Progress Notes (Signed)
Pre visit review using our clinic review tool, if applicable. No additional management support is needed unless otherwise documented below in the visit note. 

## 2016-07-07 ENCOUNTER — Other Ambulatory Visit: Payer: Self-pay | Admitting: Internal Medicine

## 2016-07-07 ENCOUNTER — Other Ambulatory Visit: Payer: Self-pay | Admitting: Obstetrics and Gynecology

## 2016-07-07 DIAGNOSIS — Z1231 Encounter for screening mammogram for malignant neoplasm of breast: Secondary | ICD-10-CM

## 2016-07-29 ENCOUNTER — Ambulatory Visit: Payer: Medicare Other

## 2016-08-05 ENCOUNTER — Ambulatory Visit
Admission: RE | Admit: 2016-08-05 | Discharge: 2016-08-05 | Disposition: A | Discharge status: Home / Self Care | Payer: Medicare Other | Source: Ambulatory Visit | Attending: Internal Medicine | Admitting: Internal Medicine

## 2016-08-05 DIAGNOSIS — Z1231 Encounter for screening mammogram for malignant neoplasm of breast: Secondary | ICD-10-CM

## 2016-08-18 DIAGNOSIS — E119 Type 2 diabetes mellitus without complications: Secondary | ICD-10-CM | POA: Diagnosis not present

## 2016-08-18 DIAGNOSIS — H2513 Age-related nuclear cataract, bilateral: Secondary | ICD-10-CM | POA: Diagnosis not present

## 2016-08-18 DIAGNOSIS — H3552 Pigmentary retinal dystrophy: Secondary | ICD-10-CM | POA: Diagnosis not present

## 2016-11-02 ENCOUNTER — Other Ambulatory Visit: Payer: Self-pay | Admitting: Internal Medicine

## 2016-11-06 ENCOUNTER — Other Ambulatory Visit: Payer: Self-pay | Admitting: Internal Medicine

## 2016-11-26 ENCOUNTER — Encounter: Payer: Self-pay | Admitting: Internal Medicine

## 2016-11-26 ENCOUNTER — Ambulatory Visit (INDEPENDENT_AMBULATORY_CARE_PROVIDER_SITE_OTHER): Payer: Medicare Other | Admitting: Internal Medicine

## 2016-11-26 ENCOUNTER — Other Ambulatory Visit (INDEPENDENT_AMBULATORY_CARE_PROVIDER_SITE_OTHER): Payer: Medicare Other

## 2016-11-26 VITALS — BP 134/80 | HR 88 | Temp 98.4°F | Resp 12 | Ht 63.0 in | Wt 180.5 lb

## 2016-11-26 DIAGNOSIS — E119 Type 2 diabetes mellitus without complications: Secondary | ICD-10-CM | POA: Diagnosis not present

## 2016-11-26 DIAGNOSIS — E669 Obesity, unspecified: Secondary | ICD-10-CM

## 2016-11-26 DIAGNOSIS — Z0000 Encounter for general adult medical examination without abnormal findings: Secondary | ICD-10-CM | POA: Diagnosis not present

## 2016-11-26 DIAGNOSIS — E785 Hyperlipidemia, unspecified: Secondary | ICD-10-CM

## 2016-11-26 DIAGNOSIS — Z1159 Encounter for screening for other viral diseases: Secondary | ICD-10-CM

## 2016-11-26 DIAGNOSIS — I10 Essential (primary) hypertension: Secondary | ICD-10-CM

## 2016-11-26 LAB — LIPID PANEL
Cholesterol: 197 mg/dL (ref 0–200)
HDL: 46.2 mg/dL (ref >39.00)
LDL Cholesterol: 125 mg/dL — ABNORMAL HIGH (ref 0–99)
NONHDL: 150.31
Total CHOL/HDL Ratio: 4
Triglycerides: 127 mg/dL (ref 0.0–149.0)
VLDL: 25.4 mg/dL (ref 0.0–40.0)

## 2016-11-26 LAB — COMPREHENSIVE METABOLIC PANEL
ALT: 12 U/L (ref 0–35)
AST: 11 U/L (ref 0–37)
Albumin: 4.2 g/dL (ref 3.5–5.2)
Alkaline Phosphatase: 68 U/L (ref 39–117)
BUN: 14 mg/dL (ref 6–23)
CALCIUM: 9.6 mg/dL (ref 8.4–10.5)
CHLORIDE: 106 meq/L (ref 96–112)
CO2: 29 meq/L (ref 19–32)
CREATININE: 0.85 mg/dL (ref 0.40–1.20)
GFR: 87.15 mL/min (ref >60.00)
Glucose, Bld: 128 mg/dL — ABNORMAL HIGH (ref 70–99)
POTASSIUM: 4 meq/L (ref 3.5–5.1)
Sodium: 142 mEq/L (ref 135–145)
Total Bilirubin: 0.3 mg/dL (ref 0.2–1.2)
Total Protein: 7.8 g/dL (ref 6.0–8.3)

## 2016-11-26 LAB — HEMOGLOBIN A1C: HEMOGLOBIN A1C: 6.2 % (ref 4.6–6.5)

## 2016-11-26 LAB — HEPATITIS C ANTIBODY: HCV Ab: NEGATIVE

## 2016-11-26 NOTE — Progress Notes (Signed)
Pre visit review using our clinic review tool, if applicable. No additional management support is needed unless otherwise documented below in the visit note. 

## 2016-11-26 NOTE — Assessment & Plan Note (Signed)
Controlled without meds, checking lipid panel and adjust for goal of LDL <100 or <70 ideal.

## 2016-11-26 NOTE — Patient Instructions (Signed)
We will check the labs today and send you the results on mychart.   Health Maintenance, Female Introduction Adopting a healthy lifestyle and getting preventive care can go a long way to promote health and wellness. Talk with your health care provider about what schedule of regular examinations is right for you. This is a good chance for you to check in with your provider about disease prevention and staying healthy. In between checkups, there are plenty of things you can do on your own. Experts have done a lot of research about which lifestyle changes and preventive measures are most likely to keep you healthy. Ask your health care provider for more information. Weight and diet Eat a healthy diet  Be sure to include plenty of vegetables, fruits, low-fat dairy products, and lean protein.  Do not eat a lot of foods high in solid fats, added sugars, or salt.  Get regular exercise. This is one of the most important things you can do for your health.  Most adults should exercise for at least 150 minutes each week. The exercise should increase your heart rate and make you sweat (moderate-intensity exercise).  Most adults should also do strengthening exercises at least twice a week. This is in addition to the moderate-intensity exercise. Maintain a healthy weight  Body mass index (BMI) is a measurement that can be used to identify possible weight problems. It estimates body fat based on height and weight. Your health care provider can help determine your BMI and help you achieve or maintain a healthy weight.  For females 32 years of age and older:  A BMI below 18.5 is considered underweight.  A BMI of 18.5 to 24.9 is normal.  A BMI of 25 to 29.9 is considered overweight.  A BMI of 30 and above is considered obese. Watch levels of cholesterol and blood lipids  You should start having your blood tested for lipids and cholesterol at 62 years of age, then have this test every 5 years.  You  may need to have your cholesterol levels checked more often if:  Your lipid or cholesterol levels are high.  You are older than 62 years of age.  You are at high risk for heart disease. Cancer screening Lung Cancer  Lung cancer screening is recommended for adults 59-34 years old who are at high risk for lung cancer because of a history of smoking.  A yearly low-dose CT scan of the lungs is recommended for people who:  Currently smoke.  Have quit within the past 15 years.  Have at least a 30-pack-year history of smoking. A pack year is smoking an average of one pack of cigarettes a day for 1 year.  Yearly screening should continue until it has been 15 years since you quit.  Yearly screening should stop if you develop a health problem that would prevent you from having lung cancer treatment. Breast Cancer  Practice breast self-awareness. This means understanding how your breasts normally appear and feel.  It also means doing regular breast self-exams. Let your health care provider know about any changes, no matter how small.  If you are in your 20s or 30s, you should have a clinical breast exam (CBE) by a health care provider every 1-3 years as part of a regular health exam.  If you are 63 or older, have a CBE every year. Also consider having a breast X-ray (mammogram) every year.  If you have a family history of breast cancer, talk to your health  care provider about genetic screening.  If you are at high risk for breast cancer, talk to your health care provider about having an MRI and a mammogram every year.  Breast cancer gene (BRCA) assessment is recommended for women who have family members with BRCA-related cancers. BRCA-related cancers include:  Breast.  Ovarian.  Tubal.  Peritoneal cancers.  Results of the assessment will determine the need for genetic counseling and BRCA1 and BRCA2 testing. Cervical Cancer  Your health care provider may recommend that you be  screened regularly for cancer of the pelvic organs (ovaries, uterus, and vagina). This screening involves a pelvic examination, including checking for microscopic changes to the surface of your cervix (Pap test). You may be encouraged to have this screening done every 3 years, beginning at age 27.  For women ages 18-65, health care providers may recommend pelvic exams and Pap testing every 3 years, or they may recommend the Pap and pelvic exam, combined with testing for human papilloma virus (HPV), every 5 years. Some types of HPV increase your risk of cervical cancer. Testing for HPV may also be done on women of any age with unclear Pap test results.  Other health care providers may not recommend any screening for nonpregnant women who are considered low risk for pelvic cancer and who do not have symptoms. Ask your health care provider if a screening pelvic exam is right for you.  If you have had past treatment for cervical cancer or a condition that could lead to cancer, you need Pap tests and screening for cancer for at least 20 years after your treatment. If Pap tests have been discontinued, your risk factors (such as having a new sexual partner) need to be reassessed to determine if screening should resume. Some women have medical problems that increase the chance of getting cervical cancer. In these cases, your health care provider may recommend more frequent screening and Pap tests. Colorectal Cancer  This type of cancer can be detected and often prevented.  Routine colorectal cancer screening usually begins at 62 years of age and continues through 62 years of age.  Your health care provider may recommend screening at an earlier age if you have risk factors for colon cancer.  Your health care provider may also recommend using home test kits to check for hidden blood in the stool.  A small camera at the end of a tube can be used to examine your colon directly (sigmoidoscopy or colonoscopy).  This is done to check for the earliest forms of colorectal cancer.  Routine screening usually begins at age 55.  Direct examination of the colon should be repeated every 5-10 years through 62 years of age. However, you may need to be screened more often if early forms of precancerous polyps or small growths are found. Skin Cancer  Check your skin from head to toe regularly.  Tell your health care provider about any new moles or changes in moles, especially if there is a change in a mole's shape or color.  Also tell your health care provider if you have a mole that is larger than the size of a pencil eraser.  Always use sunscreen. Apply sunscreen liberally and repeatedly throughout the day.  Protect yourself by wearing long sleeves, pants, a wide-brimmed hat, and sunglasses whenever you are outside. Heart disease, diabetes, and high blood pressure  High blood pressure causes heart disease and increases the risk of stroke. High blood pressure is more likely to develop in:  People  who have blood pressure in the high end of the normal range (130-139/85-89 mm Hg).  People who are overweight or obese.  People who are African American.  If you are 36-52 years of age, have your blood pressure checked every 3-5 years. If you are 1 years of age or older, have your blood pressure checked every year. You should have your blood pressure measured twice-once when you are at a hospital or clinic, and once when you are not at a hospital or clinic. Record the average of the two measurements. To check your blood pressure when you are not at a hospital or clinic, you can use:  An automated blood pressure machine at a pharmacy.  A home blood pressure monitor.  If you are between 53 years and 1 years old, ask your health care provider if you should take aspirin to prevent strokes.  Have regular diabetes screenings. This involves taking a blood sample to check your fasting blood sugar level.  If you  are at a normal weight and have a low risk for diabetes, have this test once every three years after 62 years of age.  If you are overweight and have a high risk for diabetes, consider being tested at a younger age or more often. Preventing infection Hepatitis B  If you have a higher risk for hepatitis B, you should be screened for this virus. You are considered at high risk for hepatitis B if:  You were born in a country where hepatitis B is common. Ask your health care provider which countries are considered high risk.  Your parents were born in a high-risk country, and you have not been immunized against hepatitis B (hepatitis B vaccine).  You have HIV or AIDS.  You use needles to inject street drugs.  You live with someone who has hepatitis B.  You have had sex with someone who has hepatitis B.  You get hemodialysis treatment.  You take certain medicines for conditions, including cancer, organ transplantation, and autoimmune conditions. Hepatitis C  Blood testing is recommended for:  Everyone born from [birth year] through 1966.  Anyone with known risk factors for hepatitis C. Sexually transmitted infections (STIs)  You should be screened for sexually transmitted infections (STIs) including gonorrhea and chlamydia if:  You are sexually active and are younger than 62 years of age.  You are older than 63 years of age and your health care provider tells you that you are at risk for this type of infection.  Your sexual activity has changed since you were last screened and you are at an increased risk for chlamydia or gonorrhea. Ask your health care provider if you are at risk.  If you do not have HIV, but are at risk, it may be recommended that you take a prescription medicine daily to prevent HIV infection. This is called pre-exposure prophylaxis (PrEP). You are considered at risk if:  You are sexually active and do not regularly use condoms or know the HIV status of your  partner(s).  You take drugs by injection.  You are sexually active with a partner who has HIV. Talk with your health care provider about whether you are at high risk of being infected with HIV. If you choose to begin PrEP, you should first be tested for HIV. You should then be tested every 3 months for as long as you are taking PrEP. Pregnancy  If you are premenopausal and you may become pregnant, ask your health care provider about preconception  counseling.  If you may become pregnant, take 400 to 800 micrograms (mcg) of folic acid every day.  If you want to prevent pregnancy, talk to your health care provider about birth control (contraception). Osteoporosis and menopause  Osteoporosis is a disease in which the bones lose minerals and strength with aging. This can result in serious bone fractures. Your risk for osteoporosis can be identified using a bone density scan.  If you are 40 years of age or older, or if you are at risk for osteoporosis and fractures, ask your health care provider if you should be screened.  Ask your health care provider whether you should take a calcium or vitamin D supplement to lower your risk for osteoporosis.  Menopause may have certain physical symptoms and risks.  Hormone replacement therapy may reduce some of these symptoms and risks. Talk to your health care provider about whether hormone replacement therapy is right for you. Follow these instructions at home:  Schedule regular health, dental, and eye exams.  Stay current with your immunizations.  Do not use any tobacco products including cigarettes, chewing tobacco, or electronic cigarettes.  If you are pregnant, do not drink alcohol.  If you are breastfeeding, limit how much and how often you drink alcohol.  Limit alcohol intake to no more than 1 drink per day for nonpregnant women. One drink equals 12 ounces of beer, 5 ounces of wine, or 1 ounces of hard liquor.  Do not use street  drugs.  Do not share needles.  Ask your health care provider for help if you need support or information about quitting drugs.  Tell your health care provider if you often feel depressed.  Tell your health care provider if you have ever been abused or do not feel safe at home. This information is not intended to replace advice given to you by your health care provider. Make sure you discuss any questions you have with your health care provider. Document Released: 04/26/2011 Document Revised: 03/18/2016 Document Reviewed: 07/15/2015  2017 [employer]vier

## 2016-11-26 NOTE — Assessment & Plan Note (Signed)
BP at goal today on her losartan/hctz. Checking CMP for complications and adjust as needed.

## 2016-11-26 NOTE — Assessment & Plan Note (Signed)
She declines the flu shot, shingles shot, pneumonia shot. Counseled her about the increased risk of pneumonia with her diabetes. She needs colonoscopy this year repeat and she will do that. Counseled about sun safety and mole surveillance. Given 10 year screening recommendations.

## 2016-11-26 NOTE — Assessment & Plan Note (Signed)
We talked about the need for weight loss with diet and exercise to help with her overall health. Complicated by diabetes and hypertension.

## 2016-11-26 NOTE — Assessment & Plan Note (Signed)
Checking HgA1c, lipid panel, CMP. Eye exam in October and we do not have copy of that exam. Foot exam up to date and reviewed today without changes on exam, not complicated. On ARB, not on statin.

## 2016-11-26 NOTE — Progress Notes (Signed)
   Subjective:    Patient ID: Patricia Elliott, female    DOB: [birth year], 61 y.o.   MRN: [id number]  HPI Here for medicare wellness, no new complaints. Please see A/P for status and treatment of chronic medical problems.   HPI #2: Here for medical follow up of her diabetes (taking her jentadueto, eye exam in October without changes, no new numbness or tingling in her feet, on ARB, not on statin), and her blood pressure (BP at goal on her losartan/hctz, not complicated), and her cholesterol (managed with diet, not on meds, exercising some with her grandkids, diet is okay right now but worse over the holidays). No new concerns.   Diet: DM since diabetic Physical activity: sedentary Depression/mood screen: negative Hearing: intact to whispered voice Visual acuity: grossly normal, performs annual eye exam  ADLs: capable Fall risk: none Home safety: good Cognitive evaluation: intact to orientation, naming, recall and repetition EOL planning: adv directives discussed, in place  I have personally reviewed and have noted 1. The patient's medical and social history - reviewed today no changes 2. Their use of alcohol, tobacco or illicit drugs 3. Their current medications and supplements 4. The patient's functional ability including ADL's, fall risks, home safety risks and hearing or visual impairment. 5. Diet and physical activities 6. Evidence for depression or mood disorders 7. Care team reviewed and updated (available in snapshot)  Review of Systems  Constitutional: Negative.   HENT: Negative.   Eyes: Negative.   Respiratory: Negative for cough, chest tightness and shortness of breath.   Cardiovascular: Negative for chest pain, palpitations and leg swelling.  Gastrointestinal: Negative for abdominal distention, abdominal pain, constipation, diarrhea, nausea and vomiting.  Musculoskeletal: Negative.   Skin: Negative.   Neurological: Negative.   Psychiatric/Behavioral: Negative.      Objective:   Physical Exam  Constitutional: She is oriented to person, place, and time. She appears well-developed and well-nourished.  HENT:  Head: Normocephalic and atraumatic.  Eyes: EOM are normal.  Neck: Normal range of motion.  Cardiovascular: Normal rate and regular rhythm.   Pulmonary/Chest: Effort normal and breath sounds normal. No respiratory distress. She has no wheezes. She has no rales.  Abdominal: Soft. Bowel sounds are normal. She exhibits no distension. There is no tenderness. There is no rebound.  Musculoskeletal: She exhibits no edema.  Neurological: She is alert and oriented to person, place, and time. Coordination normal.  Skin: Skin is warm and dry.  Psychiatric: She has a normal mood and affect.   Vitals:   11/26/16 0757  BP: 134/80  Pulse: 88  Resp: 12  Temp: 98.4 F (36.9 C)  TempSrc: Oral  SpO2: 98%  Weight: 180 lb 8 oz (81.9 kg)  Height: 5\' 3"  (1.6 m)      Assessment & Plan:

## 2017-02-15 ENCOUNTER — Ambulatory Visit (INDEPENDENT_AMBULATORY_CARE_PROVIDER_SITE_OTHER)
Admission: RE | Admit: 2017-02-15 | Discharge: 2017-02-15 | Disposition: A | Discharge status: Home / Self Care | Payer: Medicare Other | Source: Ambulatory Visit | Attending: Internal Medicine | Admitting: Internal Medicine

## 2017-02-15 ENCOUNTER — Telehealth: Payer: Self-pay

## 2017-02-15 ENCOUNTER — Encounter: Payer: Self-pay | Admitting: Gastroenterology

## 2017-02-15 ENCOUNTER — Telehealth: Payer: Self-pay | Admitting: Internal Medicine

## 2017-02-15 ENCOUNTER — Ambulatory Visit (INDEPENDENT_AMBULATORY_CARE_PROVIDER_SITE_OTHER): Payer: Medicare Other | Admitting: Internal Medicine

## 2017-02-15 ENCOUNTER — Other Ambulatory Visit (INDEPENDENT_AMBULATORY_CARE_PROVIDER_SITE_OTHER): Payer: Medicare Other

## 2017-02-15 ENCOUNTER — Encounter: Payer: Self-pay | Admitting: Internal Medicine

## 2017-02-15 VITALS — BP 128/80 | HR 86 | Ht 63.0 in | Wt 184.0 lb

## 2017-02-15 DIAGNOSIS — R1032 Left lower quadrant pain: Secondary | ICD-10-CM

## 2017-02-15 DIAGNOSIS — N83202 Unspecified ovarian cyst, left side: Secondary | ICD-10-CM | POA: Diagnosis not present

## 2017-02-15 LAB — CBC WITH DIFFERENTIAL/PLATELET
BASOS ABS: 0 10*3/uL (ref 0.0–0.1)
BASOS PCT: 0.6 % (ref 0.0–3.0)
EOS ABS: 0.1 10*3/uL (ref 0.0–0.7)
Eosinophils Relative: 1.4 % (ref 0.0–5.0)
HEMATOCRIT: 40.7 % (ref 36.0–46.0)
Hemoglobin: 13.4 g/dL (ref 12.0–15.0)
LYMPHS ABS: 2.9 10*3/uL (ref 0.7–4.0)
LYMPHS PCT: 40.7 % (ref 12.0–46.0)
MCHC: 32.9 g/dL (ref 30.0–36.0)
MCV: 88.4 fl (ref 78.0–100.0)
MONOS PCT: 8.3 % (ref 3.0–12.0)
Monocytes Absolute: 0.6 10*3/uL (ref 0.1–1.0)
NEUTROS ABS: 3.5 10*3/uL (ref 1.4–7.7)
Neutrophils Relative %: 49 % (ref 43.0–77.0)
PLATELETS: 248 10*3/uL (ref 150.0–400.0)
RBC: 4.6 Mil/uL (ref 3.87–5.11)
RDW: 15.9 % — AB (ref 11.5–15.5)
WBC: 7.2 10*3/uL (ref 4.0–10.5)

## 2017-02-15 LAB — BASIC METABOLIC PANEL
BUN: 8 mg/dL (ref 6–23)
CHLORIDE: 101 meq/L (ref 96–112)
CO2: 28 meq/L (ref 19–32)
Calcium: 9.9 mg/dL (ref 8.4–10.5)
Creatinine, Ser: 0.83 mg/dL (ref 0.40–1.20)
GFR: 89.52 mL/min (ref >60.00)
Glucose, Bld: 134 mg/dL — ABNORMAL HIGH (ref 70–99)
Potassium: 3.8 mEq/L (ref 3.5–5.1)
SODIUM: 137 meq/L (ref 135–145)

## 2017-02-15 LAB — URINALYSIS, ROUTINE W REFLEX MICROSCOPIC
Bilirubin Urine: NEGATIVE
Hgb urine dipstick: NEGATIVE
Ketones, ur: NEGATIVE
LEUKOCYTES UA: NEGATIVE
Nitrite: NEGATIVE
PH: 5.5 (ref 5.0–8.0)
RBC / HPF: NONE SEEN (ref 0–?)
SPECIFIC GRAVITY, URINE: 1.02 (ref 1.000–1.030)
TOTAL PROTEIN, URINE-UPE24: NEGATIVE
Urine Glucose: NEGATIVE
Urobilinogen, UA: 1 (ref 0.0–1.0)
WBC, UA: NONE SEEN (ref 0–?)

## 2017-02-15 LAB — HEPATIC FUNCTION PANEL
ALK PHOS: 66 U/L (ref 39–117)
ALT: 14 U/L (ref 0–35)
AST: 13 U/L (ref 0–37)
Albumin: 4.4 g/dL (ref 3.5–5.2)
BILIRUBIN DIRECT: 0.1 mg/dL (ref 0.0–0.3)
BILIRUBIN TOTAL: 0.4 mg/dL (ref 0.2–1.2)
TOTAL PROTEIN: 8.2 g/dL (ref 6.0–8.3)

## 2017-02-15 LAB — LIPASE: Lipase: 52 U/L (ref 11.0–59.0)

## 2017-02-15 MED ORDER — HYDROCODONE-ACETAMINOPHEN 7.5-325 MG PO TABS: 1.0000 | ORAL_TABLET | Freq: Four times a day (QID) | ORAL | 0 refills | Status: DC | PRN

## 2017-02-15 MED ORDER — IOPAMIDOL (ISOVUE-300) INJECTION 61%
100.0000 mL | Freq: Once | INTRAVENOUS | Status: AC | PRN
  Administered 2017-02-15: 100 mL via INTRAVENOUS

## 2017-02-15 MED ORDER — CIPROFLOXACIN HCL 500 MG PO TABS: 500.0000 mg | ORAL_TABLET | Freq: Two times a day (BID) | ORAL | 0 refills | Status: AC

## 2017-02-15 MED ORDER — ONDANSETRON HCL 4 MG PO TABS: 4.0000 mg | ORAL_TABLET | Freq: Three times a day (TID) | ORAL | 0 refills | Status: DC | PRN

## 2017-02-15 MED ORDER — CEFTRIAXONE SODIUM 1 G IJ SOLR
1.0000 g | Freq: Once | INTRAMUSCULAR | Status: AC
  Administered 2017-02-15: 1 g via INTRAMUSCULAR

## 2017-02-15 MED ORDER — METRONIDAZOLE 250 MG PO TABS: 250.0000 mg | ORAL_TABLET | Freq: Three times a day (TID) | ORAL | 0 refills | Status: AC

## 2017-02-15 NOTE — Progress Notes (Signed)
Subjective:    Patient ID: Patricia Elliott, female    DOB: 03/31/1956, 61 y.o.   MRN: [id number]  HPI  Here with 2-3 days with left side pain, sever to start, some small less now but still mod to severe, constant, no radiation, assoc with n/v episode x 2 only, but last episode was this am and episode of incontinence but only at the time of vomiting; No hx or renal stone or diverticultiis.  Last colonscopy about 10 yrs with benign polyp only and f/u at 9 yrs.  No St, cough, and Pt denies chest pain, increased sob or doe, wheezing, orthopnea, PND, increased LE swelling, palpitations, dizziness or syncope.  Denies urinary symptoms such as dysuria, frequency, urgency, flank pain, hematuria. Past Medical History:  Diagnosis Date  . Arthritis   . Diabetes mellitus   . GERD (gastroesophageal reflux disease)   . Hyperlipidemia   . Hypertension   . Retinitis pigmentosa    Past Surgical History:  Procedure Laterality Date  . ABDOMINAL HYSTERECTOMY    . CESAREAN SECTION      reports that she has never smoked. She has never used smokeless tobacco. She reports that she does not drink alcohol or use drugs. family history is not on file. No Known Allergies Current Outpatient Prescriptions on File Prior to Visit  Medication Sig Dispense Refill  . gabapentin (NEURONTIN) 300 MG capsule Take 1 capsule (300 mg total) by mouth at bedtime. 90 capsule 3  . glucose blood test strip Use BID to test CBG's 100 each 3  . JENTADUETO 2.5-500 MG TABS TAKE 1 TABLET TWICE A DAY 180 tablet 1  . lansoprazole (PREVACID) 15 MG capsule Take 15 mg by mouth daily.      Marland Kitchen losartan-hydrochlorothiazide (HYZAAR) 50-12.5 MG tablet TAKE 1 TABLET DAILY 90 tablet 1  . traMADol (ULTRAM) 50 MG tablet Take 1 tablet (50 mg total) by mouth every 12 (twelve) hours as needed. 180 tablet 0   No current facility-administered medications on file prior to visit.    Review of Systems  Constitutional: Negative for other unusual  diaphoresis or sweats HENT: Negative for ear discharge or swelling Eyes: Negative for other worsening visual disturbances Respiratory: Negative for stridor or other swelling  Gastrointestinal: Negative for worsening distension or other blood Genitourinary: Negative for retention or other urinary change Musculoskeletal: Negative for other MSK pain or swelling Skin: Negative for color change or other new lesions Neurological: Negative for worsening tremors and other numbness  Psychiatric/Behavioral: Negative for worsening agitation or other fatigue All other system neg per pt    Objective:   Physical Exam BP 128/80   Pulse 86   Ht 5\' 3"  (1.6 m)   Wt 184 lb (83.5 kg)   SpO2 99%   BMI 32.59 kg/m  VS noted,  Constitutional: Pt appears in NAD HENT: Head: NCAT.  Right Ear: External ear normal.  Left Ear: External ear normal.  Eyes: . Pupils are equal, round, and reactive to light. Conjunctivae and EOM are normal Nose: without d/c or deformity Neck: Neck supple. Gross normal ROM Cardiovascular: Normal rate and regular rhythm.   Pulmonary/Chest: Effort normal and breath sounds without rales or wheezing.  Abd:  Soft, ND, + BS, no organomegaly but moderate tender LLQ, without guarding or rebound Neurological: Pt is alert. At baseline orientation, motor grossly intact Skin: Skin is warm. No rashes, other new lesions, no LE edema Psychiatric: Pt behavior is normal without agitation  No other exam finginds  Assessment & Plan:

## 2017-02-15 NOTE — Progress Notes (Signed)
Pre visit review using our clinic review tool, if applicable. No additional management support is needed unless otherwise documented below in the visit note. 

## 2017-02-15 NOTE — Telephone Encounter (Signed)
Pt has been informed of the below information. She expressed understanding.

## 2017-02-15 NOTE — Assessment & Plan Note (Addendum)
Acute onset, assoc with mod to severe pain persistent, feels ill, n/v with incontinence only assoc with vomiting; suspect primary issue is acute diverticulitis, though no hx of this and none apparently seen on colonscopy 2008; differential includes renal stone but doubt.  WIll need Rocephin IM 1 gm, cipro and flagyl empiric, stat CT abd with CM r/o abscess and labs including cbc,bmp, lipase, ua as ordered; also  Refer GI as she is due for f/u colnoscopy at any rate

## 2017-02-15 NOTE — Telephone Encounter (Signed)
-----   Message from Biagio Borg, MD sent at 02/15/2017  3:49 PM EDT ----- Left message on MyChart, pt to cont same tx except  The test results show that your current treatment is OK, as there is no definite diverticultis seen on the CT, and no other explanation. This sometimes means the pain was muscular, though other such as adhesions (scarring) and even constipation can be the cause.  OK to hold off on taking the antibiotics.  But ok for the other medicaitons.  You should also hear from the office about this.   Patricia Elliott to please inform pt, and pt should f.u with [facility]p for any worsening s/s

## 2017-02-15 NOTE — Patient Instructions (Signed)
You had the antibiotic shot today  Please take all new medication as prescribed - the two antibiotics (cipro and flagyl), pain medication and nausea med  Please see [facility] now for scheduling stat CT  Please continue all other medications as before, and refills have been done if requested.  Please have the pharmacy call with any other refills you may need.  Please keep your appointments with your specialists as you may have planned  Please go to the LAB in the Basement (turn left off the elevator) for the tests to be done today  You will be contacted by phone if any changes need to be made immediately.  Otherwise, you will receive a letter about your results with an explanation, but please check with MyChart first.  Also , we should refer you to Gastroenterology as you are very close to due for follow up colonoscopy as well  Please remember to sign up for MyChart if you have not done so, as this will be important to you in the future with finding out test results, communicating by private email, and scheduling acute appointments online when needed.

## 2017-02-15 NOTE — Telephone Encounter (Signed)
Patient Name: Patricia Elliott  DOB: 1956/08/27    Initial Comment Caller states c/o left side abdominal pain, vomiting and urinary incontinence.   Nurse Assessment  Nurse: Mallie Mussel, RN, Alveta Heimlich Date/Time Eilene Ghazi Time): 02/15/2017 8:42:31 AM  Confirm and document reason for call. If symptomatic, describe symptoms. ---Caller states that she has had left sided abdominal pain beginning Sunday. She rates the pain as 5 on 0-10 scale. She began vomiting this morning x 1. She vomited x 1 on Sunday also. She has urinary incontinence which began Sunday. Denies pain/burning with the urine and denies blood in her urine.  Does the patient have any new or worsening symptoms? ---Yes  Will a triage be completed? ---Yes  Related visit to physician within the last 2 weeks? ---No  Does the PT have any chronic conditions? (i.e. diabetes, asthma, etc.) ---Yes  List chronic conditions. ---Diabetes, HTN, Hypercholesterolemia  Is this a behavioral health or substance abuse call? ---No     Guidelines    Guideline Title Affirmed Question Affirmed Notes  Flank Pain Vomiting    Final Disposition User   Go to ED Now (or PCP triage) Mallie Mussel, RN, [place]    Comments  Dr. Sharlet Salina did not have anything open today. I was able to find an appointment with Dr. Cathlean Cower at 10:15am for her.   Referrals  REFERRED TO PCP OFFICE   Disagree/Comply: Comply

## 2017-03-04 ENCOUNTER — Telehealth: Payer: Self-pay | Admitting: Internal Medicine

## 2017-03-04 NOTE — Telephone Encounter (Signed)
I did not see her for the problem she is referred for but would recommend to follow through with this.

## 2017-03-04 NOTE — Telephone Encounter (Signed)
Pt called and wanted to see if she still needed to keep the appt with DR Ardis Hughs.  She does not want to go if she dont need too.

## 2017-03-07 NOTE — Telephone Encounter (Signed)
Patient aware.

## 2017-03-18 ENCOUNTER — Ambulatory Visit (INDEPENDENT_AMBULATORY_CARE_PROVIDER_SITE_OTHER): Payer: Medicare Other | Admitting: Gastroenterology

## 2017-03-18 ENCOUNTER — Encounter: Payer: Self-pay | Admitting: Gastroenterology

## 2017-03-18 VITALS — BP 110/62 | HR 68 | Ht 63.0 in | Wt 181.0 lb

## 2017-03-18 DIAGNOSIS — R1032 Left lower quadrant pain: Secondary | ICD-10-CM | POA: Diagnosis not present

## 2017-03-18 DIAGNOSIS — Z1211 Encounter for screening for malignant neoplasm of colon: Secondary | ICD-10-CM

## 2017-03-18 MED ORDER — NA SULFATE-K SULFATE-MG SULF 17.5-3.13-1.6 GM/177ML PO SOLN: 1.0000 | Freq: Once | ORAL | 0 refills | Status: AC

## 2017-03-18 NOTE — Progress Notes (Signed)
HPI: This is a   very pleasant 61 year old woman who was referred to me by Biagio Borg, MD  to evaluate  left lower quadrant pain .    Chief complaint is left lower quadrant pain  She had severe pain left low quadrant, almost low left flank. + associated with nausea, vomiting. The pain lasted 1-2 days.  No fever.   That was about one month ago and since then her pain has completely resolved.  Her sister had an upper respiratory type infection around the same time  She has had no overt change in her bowels, no blood in her stool.  Went to PCP, was given an injection of ceph and then prescription or oral abx (she did take them after CT report).  Colonoscopy July 2008 for routine screening: Single polyp was removed but this was not precancerous. She was recommended to have repeat screening examination at 10 year interval.  Old Data Reviewed:  CT scan 01/2017 with IV and oral contrast: no explanation for LLQ pain, no diverticulosis Labs 01/2017 normal CBC, CMET, lipase   Review of systems: Pertinent positive and negative review of systems were noted in the above HPI section. All other review negative.   Past Medical History:  Diagnosis Date  . Arthritis   . Diabetes mellitus   . GERD (gastroesophageal reflux disease)   . Hyperlipidemia   . Hypertension   . Retinitis pigmentosa     Past Surgical History:  Procedure Laterality Date  . ABDOMINAL HYSTERECTOMY    . CESAREAN SECTION      Current Outpatient Prescriptions  Medication Sig Dispense Refill  . gabapentin (NEURONTIN) 300 MG capsule Take 1 capsule (300 mg total) by mouth at bedtime. 90 capsule 3  . glucose blood test strip Use BID to test CBG's 100 each 3  . JENTADUETO 2.5-500 MG TABS TAKE 1 TABLET TWICE A DAY 180 tablet 1  . lansoprazole (PREVACID) 15 MG capsule Take 15 mg by mouth daily.      Marland Kitchen losartan-hydrochlorothiazide (HYZAAR) 50-12.5 MG tablet TAKE 1 TABLET DAILY 90 tablet 1  . ondansetron (ZOFRAN) 4 MG tablet Take  1 tablet (4 mg total) by mouth every 8 (eight) hours as needed for nausea or vomiting. 20 tablet 0   No current facility-administered medications for this visit.     Allergies as of 03/18/2017  . (No Known Allergies)    Family History  Problem Relation Age of Onset  . Hypertension Mother   . Diabetes Father   . Heart disease Father   . Hypertension Sister   . Diabetes Brother   . Hypertension Brother   . Diabetes Son   . Colon cancer Neg Hx   . Stomach cancer Neg Hx     Social History   Social History  . Marital status: Married    Spouse name: N/A  . Number of children: N/A  . Years of education: N/A   Occupational History  . Not on file.   Social History Main Topics  . Smoking status: Never Smoker  . Smokeless tobacco: Never Used  . Alcohol use No  . Drug use: No  . Sexual activity: Yes    Partners: Male   Other Topics Concern  . Not on file   Social History Narrative  . No narrative on file     Physical Exam: Ht 5\' 3"  (1.6 m)   Wt 181 lb (82.1 kg)   BMI 32.06 kg/m  Constitutional: generally well-appearing Psychiatric: alert and  oriented x3 Eyes: extraocular movements intact Mouth: oral pharynx moist, no lesions Neck: supple no lymphadenopathy Cardiovascular: heart regular rate and rhythm Lungs: clear to auscultation bilaterally Abdomen: soft, nontender, nondistended, no obvious ascites, no peritoneal signs, normal bowel sounds Extremities: no lower extremity edema bilaterally Skin: no lesions on visible extremities   Assessment and plan: 61 y.o. female with  Recent but resolved left lower quadrant pain, routine risk for colon cancer  Her left lower quadrant pain is of unclear etiology. The pain was really located almost in the left flank and possibly was kidney stone related however her urinalysis showed no blood in her urine. It would be unusual for diverticulitis to occur without white blood cell count or any imaging findings suggestive of  diverticulitis. Perhaps this was musculoskeletal. Either way her pain has completely resolved and has not returned. I recommended that she call if the pain returns.  Her last colon cancer screening test was 10 years ago and I recommended we repeat screening now with colonoscopy at her soonest convenience.    Please see the "Patient Instructions" section for addition details about the plan.   Owens Loffler, MD [facility] 03/18/2017, 11:12 AM  Cc: Biagio Borg, MD

## 2017-03-18 NOTE — Patient Instructions (Addendum)
You have been scheduled for a colonoscopy. Please follow written instructions given to you at your visit today.  Please pick up your prep supplies at the pharmacy within the next 1-3 days. If you use inhalers (even only as needed), please bring them with you on the day of your procedure. Your physician has requested that you go to www.startemmi.com and enter the access code given to you at your visit today. This web site gives a general overview about your procedure. However, you should still follow specific instructions given to you by our office regarding your preparation for the procedure.  Thank you for choosing [facility]

## 2017-05-10 ENCOUNTER — Encounter: Payer: Self-pay | Admitting: Gastroenterology

## 2017-05-10 ENCOUNTER — Ambulatory Visit (AMBULATORY_SURGERY_CENTER): Payer: Medicare Other | Admitting: Gastroenterology

## 2017-05-10 VITALS — BP 103/58 | HR 67 | Temp 97.8°F | Resp 14 | Ht 63.0 in | Wt 181.0 lb

## 2017-05-10 DIAGNOSIS — Z1211 Encounter for screening for malignant neoplasm of colon: Secondary | ICD-10-CM | POA: Diagnosis not present

## 2017-05-10 DIAGNOSIS — E119 Type 2 diabetes mellitus without complications: Secondary | ICD-10-CM | POA: Diagnosis not present

## 2017-05-10 DIAGNOSIS — Z1212 Encounter for screening for malignant neoplasm of rectum: Secondary | ICD-10-CM

## 2017-05-10 DIAGNOSIS — I10 Essential (primary) hypertension: Secondary | ICD-10-CM | POA: Diagnosis not present

## 2017-05-10 MED ORDER — SODIUM CHLORIDE 0.9 % IV SOLN: 500.0000 mL | INTRAVENOUS | Status: DC

## 2017-05-10 NOTE — Patient Instructions (Signed)
YOU HAD AN ENDOSCOPIC PROCEDURE TODAY AT THE [facility]:   Refer to the procedure report that was given to you for any specific questions about what was found during the examination.  If the procedure report does not answer your questions, please call your gastroenterologist to clarify.  If you requested that your care partner not be given the details of your procedure findings, then the procedure report has been included in a sealed envelope for you to review at your convenience later.  YOU SHOULD EXPECT: Some feelings of bloating in the abdomen. Passage of more gas than usual.  Walking can help get rid of the air that was put into your GI tract during the procedure and reduce the bloating. If you had a lower endoscopy (such as a colonoscopy or flexible sigmoidoscopy) you may notice spotting of blood in your stool or on the toilet paper. If you underwent a bowel prep for your procedure, you may not have a normal bowel movement for a few days.  Please Note:  You might notice some irritation and congestion in your nose or some drainage.  This is from the oxygen used during your procedure.  There is no need for concern and it should clear up in a day or so.  SYMPTOMS TO REPORT IMMEDIATELY:   Following lower endoscopy (colonoscopy or flexible sigmoidoscopy):  Excessive amounts of blood in the stool  Significant tenderness or worsening of abdominal pains  Swelling of the abdomen that is new, acute  Fever of 100F or higher   For urgent or emergent issues, a gastroenterologist can be reached at any hour by calling [phone].   DIET:  We do recommend a small meal at first, but then you may proceed to your regular diet.  Drink plenty of fluids but you should avoid alcoholic beverages for 24 hours.  ACTIVITY:  You should plan to take it easy for the rest of today and you should NOT DRIVE or use heavy machinery until tomorrow (because of the sedation medicines used during the test).     FOLLOW UP: Our staff will call the number listed on your records the next business day following your procedure to check on you and address any questions or concerns that you may have regarding the information given to you following your procedure. If we do not reach you, we will leave a message.  However, if you are feeling well and you are not experiencing any problems, there is no need to return our call.  We will assume that you have returned to your regular daily activities without incident.  If any biopsies were taken you will be contacted by phone or by letter within the next 1-3 weeks.  Please call us at [phone] if you have not heard about the biopsies in 3 weeks.    SIGNATURES/CONFIDENTIALITY: You and/or your care partner have signed paperwork which will be entered into your electronic medical record.  These signatures attest to the fact that that the information above on your After Visit Summary has been reviewed and is understood.  Full responsibility of the confidentiality of this discharge information lies with you and/or your care-partner.   Resume medications.  

## 2017-05-10 NOTE — Op Note (Signed)
[facility] Patient Name: Patricia Elliott Procedure Date: 05/10/2017 1:25 PM MRN: [id number] Endoscopist: Milus Banister , MD Age: 61 Referring MD:  Date of Birth: Apr 18, 1956 Gender: Female Account #: [id number] Procedure:                Colonoscopy Indications:              Screening for colorectal malignant neoplasm Medicines:                Monitored Anesthesia Care Procedure:                Pre-Anesthesia Assessment:                           - Prior to the procedure, a History and Physical                            was performed, and patient medications and                            allergies were reviewed. The patient's tolerance of                            previous anesthesia was also reviewed. The risks                            and benefits of the procedure and the sedation                            options and risks were discussed with the patient.                            All questions were answered, and informed consent                            was obtained. Prior Anticoagulants: The patient has                            taken no previous anticoagulant or antiplatelet                            agents. ASA Grade Assessment: II - A patient with                            mild systemic disease. After reviewing the risks                            and benefits, the patient was deemed in                            satisfactory condition to undergo the procedure.                           After obtaining informed consent, the colonoscope  was passed under direct vision. Throughout the                            procedure, the patient's blood pressure, pulse, and                            oxygen saturations were monitored continuously. The                            Colonoscope was introduced through the anus and                            advanced to the the cecum, identified by                            appendiceal orifice and  ileocecal valve. The                            colonoscopy was performed without difficulty. The                            patient tolerated the procedure well. The quality                            of the bowel preparation was excellent. The                            ileocecal valve, appendiceal orifice, and rectum                            were photographed. Scope In: 1:27:18 PM Scope Out: 1:35:39 PM Scope Withdrawal Time: 0 hours 6 minutes 36 seconds  Total Procedure Duration: 0 hours 8 minutes 21 seconds  Findings:                 The entire examined colon appeared normal on direct                            and retroflexion views. Complications:            No immediate complications. Estimated blood loss:                            None. Estimated Blood Loss:     Estimated blood loss: none. Impression:               - The entire examined colon is normal on direct and                            retroflexion views.                           - No polyps or cancers. Recommendation:           - Patient has a contact number available for  emergencies. The signs and symptoms of potential                            delayed complications were discussed with the                            patient. Return to normal activities tomorrow.                            Written discharge instructions were provided to the                            patient.                           - Resume previous diet.                           - Continue present medications.                           - Repeat colonoscopy in 10 years for screening                            purposes. Milus Banister, MD 05/10/2017 1:38:29 PM This report has been signed electronically.

## 2017-05-10 NOTE — Progress Notes (Signed)
Report given to PACU, vss 

## 2017-05-11 ENCOUNTER — Telehealth: Payer: Self-pay | Admitting: *Deleted

## 2017-05-11 NOTE — Telephone Encounter (Signed)
  Follow up Call-  Call back number 05/10/2017  Post procedure Call Back phone  # [phone]  Permission to leave phone message Yes  Some recent data might be hidden     Patient questions:  Do you have a fever, pain , or abdominal swelling? No. Pain Score  0 *  Have you tolerated food without any problems? Yes.    Have you been able to return to your normal activities? Yes.    Do you have any questions about your discharge instructions: Diet   No. Medications  No. Follow up visit  No.  Do you have questions or concerns about your Care? No.  Actions: * If pain score is 4 or above: No action needed, pain <4.

## 2017-05-13 ENCOUNTER — Other Ambulatory Visit: Payer: Self-pay | Admitting: Internal Medicine

## 2017-07-06 ENCOUNTER — Other Ambulatory Visit: Payer: Self-pay | Admitting: Internal Medicine

## 2017-07-06 DIAGNOSIS — Z1231 Encounter for screening mammogram for malignant neoplasm of breast: Secondary | ICD-10-CM

## 2017-08-08 ENCOUNTER — Ambulatory Visit
Admission: RE | Admit: 2017-08-08 | Discharge: 2017-08-08 | Disposition: A | Discharge status: Home / Self Care | Payer: Medicare Other | Source: Ambulatory Visit | Attending: Internal Medicine | Admitting: Internal Medicine

## 2017-08-08 DIAGNOSIS — Z1231 Encounter for screening mammogram for malignant neoplasm of breast: Secondary | ICD-10-CM

## 2017-08-18 DIAGNOSIS — E119 Type 2 diabetes mellitus without complications: Secondary | ICD-10-CM | POA: Diagnosis not present

## 2017-08-18 DIAGNOSIS — H2513 Age-related nuclear cataract, bilateral: Secondary | ICD-10-CM | POA: Diagnosis not present

## 2017-08-18 DIAGNOSIS — H3552 Pigmentary retinal dystrophy: Secondary | ICD-10-CM | POA: Diagnosis not present

## 2017-08-18 LAB — HM DIABETES EYE EXAM

## 2017-08-26 ENCOUNTER — Encounter: Payer: Self-pay | Admitting: Internal Medicine

## 2017-10-25 IMAGING — MR MR CERVICAL SPINE W/O CM
4 of 5 series · 27 of 48 positions shown · non-contrast
Comparison: None.

CLINICAL DATA: Shooting pain to LEFT shoulder and fingers for 6
months. Weakness and numbness. LEFT arm.

EXAM:
MRI CERVICAL SPINE WITHOUT CONTRAST
TECHNIQUE: Multiplanar, multisequence MR imaging of the cervical spine was
performed. No intravenous contrast was administered.

[Series 6: T1 · sagittal · 3.0mm · 0.66mm/px · 6 of 15 slices shown]
[im 1/15]
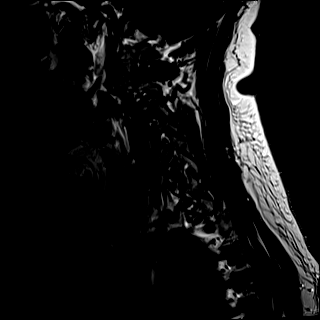
[im 3/15]
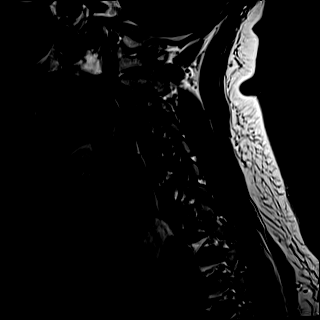
[im 6/15]
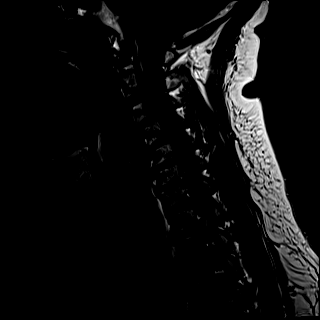
[im 9/15]
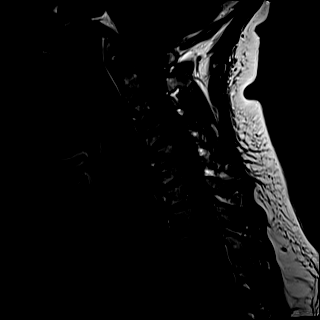
[im 12/15]
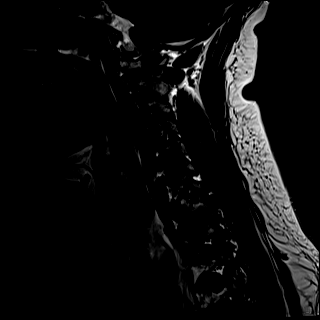
[im 15/15]
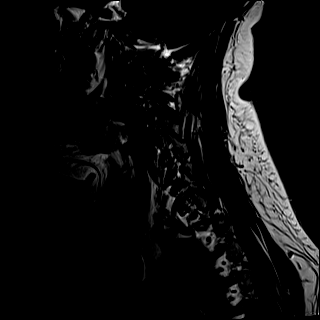

[Series 7: T2 · sagittal · 3.0mm · 0.55mm/px · 7 of 15 slices shown (1 of 2)]
[im 1/15]
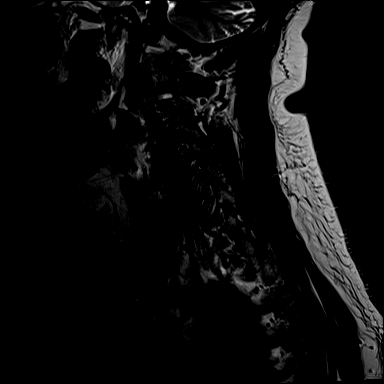
[im 3/15]
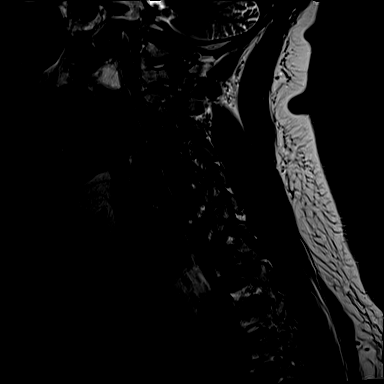
[im 5/15]
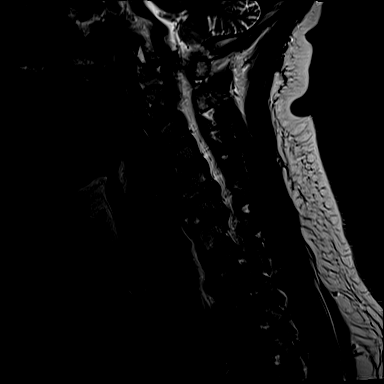
[im 8/15]
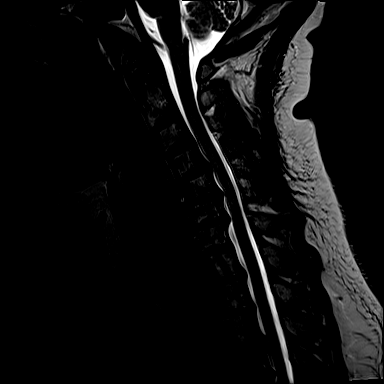
[im 10/15]
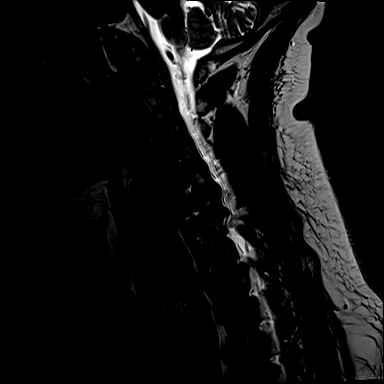
[im 12/15]
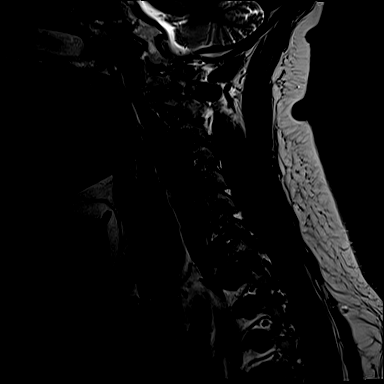
[im 15/15]
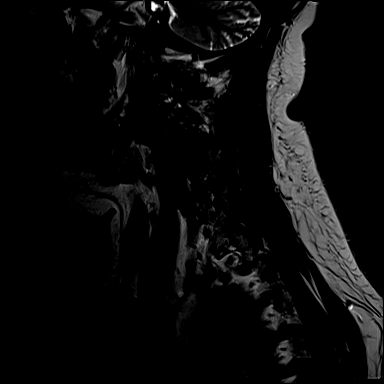

[Series 10: T2 · axial · 3.0mm · 0.53mm/px · z∈[-44,+45]mm · 8 of 30 slices shown (2 of 2)]
[im 1/30]
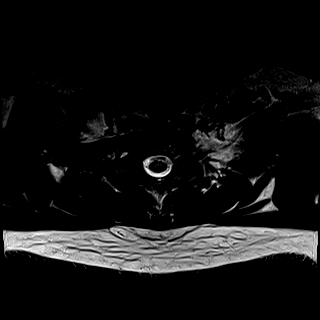
[im 5/30]
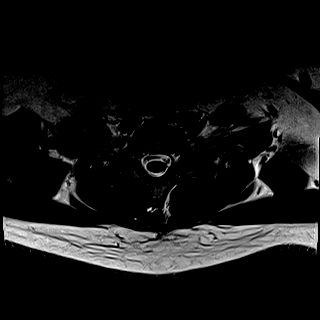
[im 9/30]
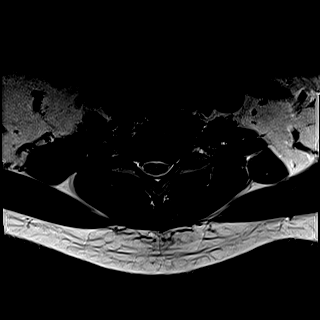
[im 14/30]
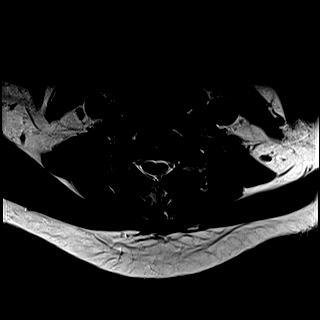
[im 16/30]
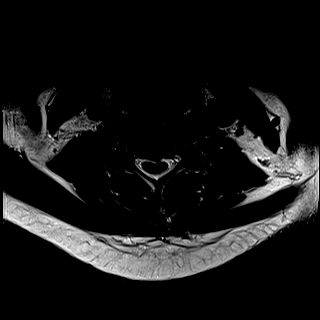
[im 21/30]
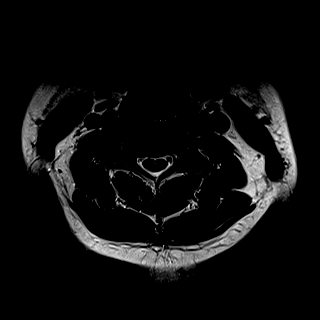
[im 25/30]
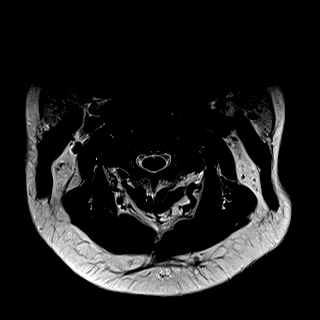
[im 30/30]
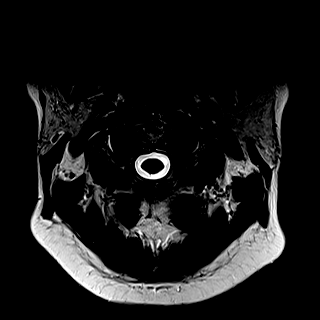

[Series 11: STIR · sagittal · 3.0mm · 0.33mm/px · 6 of 15 slices shown]
[im 1/15]
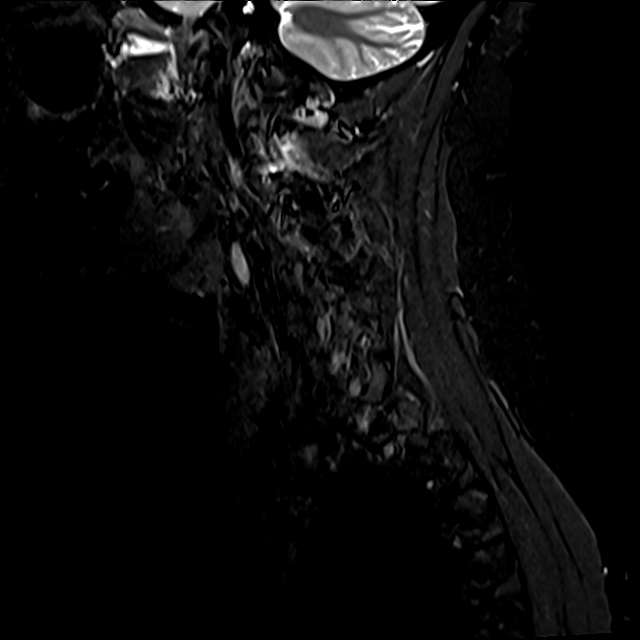
[im 3/15]
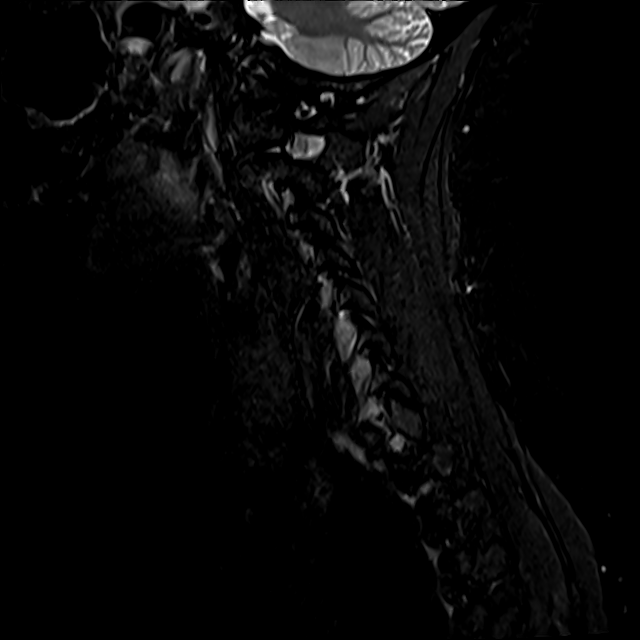
[im 5/15]
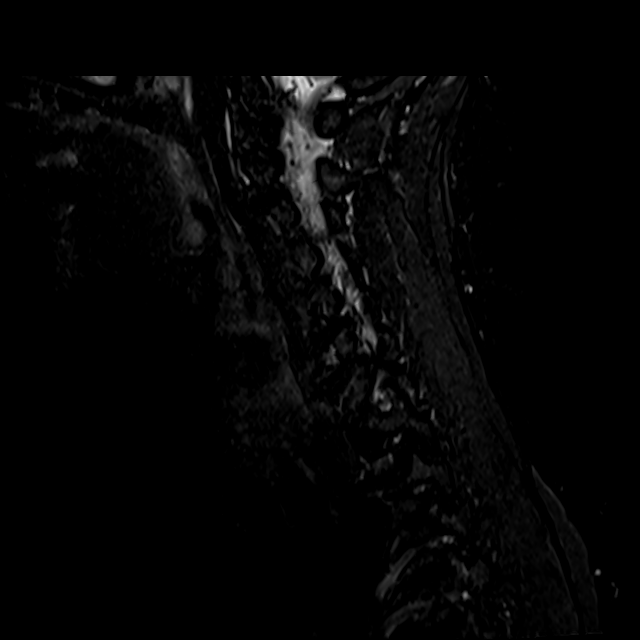
[im 8/15]
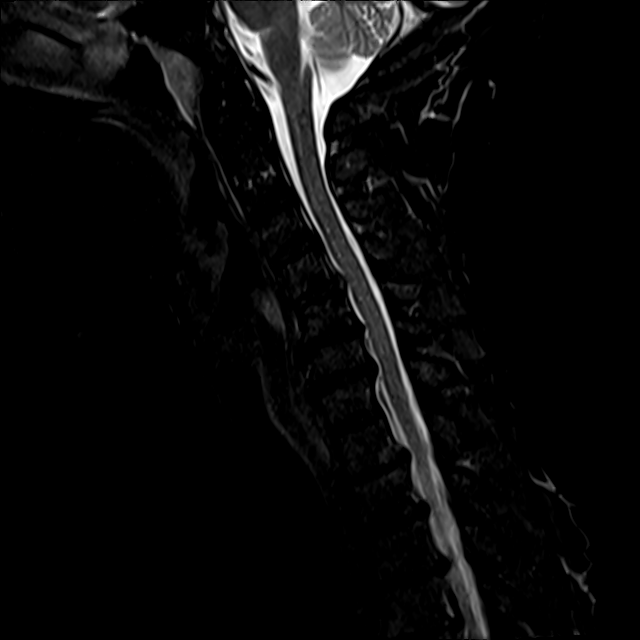
[im 10/15]
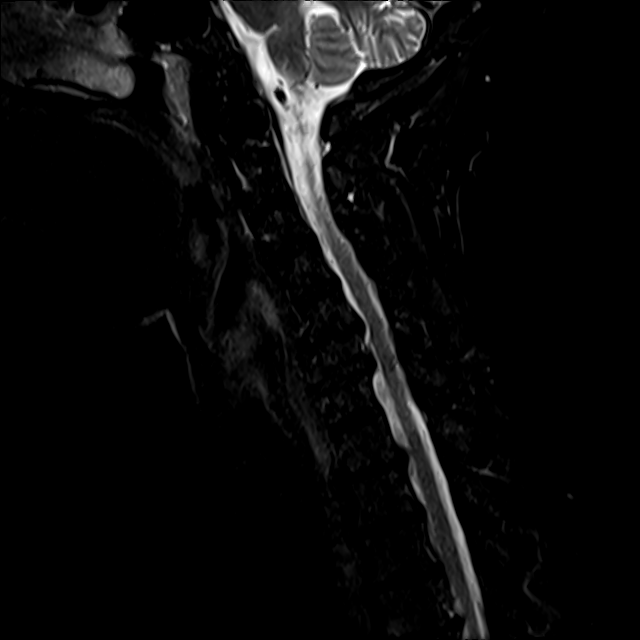
[im 12/15]
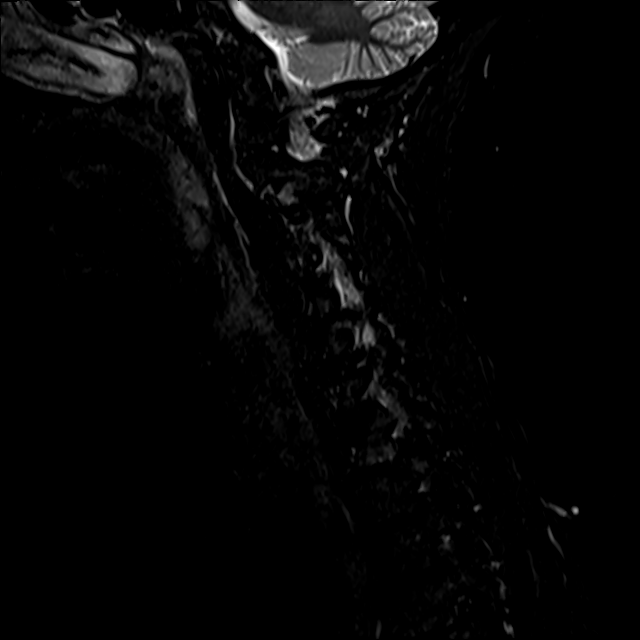

[27 of 48 positions shown; findings below may reference images not displayed]

FINDINGS: Alignment: Reversal of the normal cervical lordotic curve. No
significant subluxation.

Vertebrae: No worrisome osseous lesion.

Cord: Multilevel cord compression, described below, without visible
abnormal cord signal.

Posterior Fossa: No tonsillar herniation.

Vertebral Arteries: BILATERAL patent.  LEFT dominant.

Paraspinal tissues: Unremarkable.

Disc levels:

The individual disc spaces were examined as follows:

C2-3:  Mild bulge.  Mild facet arthropathy.  No impingement.

C3-4: Central protrusion. Right-sided uncinate spurring. RIGHT
greater than LEFT facet arthropathy. RIGHT C4 nerve root
impingement.

C4-5: Central protrusion. Mild cord flattening. No foraminal
narrowing.

C5-6: Disc space narrowing. Central disc osteophyte complex extends
to the LEFT. Mild left-sided cord flattening. LEFT greater than
RIGHT C6 nerve root impingement.

C6-7: Disc space narrowing. Central and leftward disc osteophyte
complex with uncinate spurring. Slight effacement anterior
subarachnoid space with minimal cord flattening. LEFT C7 nerve root
impingement.

C7-T1: Mild bulge. Asymmetric uncinate spurring to the LEFT. LEFT C8
nerve root impingement is possible. BILATERAL facet arthropathy.

Incompletely evaluated are disc protrusions throughout the
visualized thoracic spine including T1-2, T2-3, T3-4, and T4-5.
IMPRESSION: Multilevel spondylosis as described. Potentially symptomatic
left-sided neural impingement could occur at multiple levels,
including C5-6, C6-7, and C7-T1.

Consider CT myelography for further evaluation to assess the
relative contributions of stenosis and foraminal narrowing to the
patient's left-sided symptoms.

## 2017-11-09 ENCOUNTER — Telehealth: Payer: Self-pay | Admitting: Internal Medicine

## 2017-11-09 NOTE — Telephone Encounter (Signed)
Spoke with Patricia Elliott regarding AWV. Pt stated that she will give office a call back to schedule her wellness appt. SF

## 2017-12-01 NOTE — Progress Notes (Signed)
Subjective:   Patricia Elliott is a 62 y.o. female who presents for Medicare Annual (Subsequent) preventive examination.  Patient reports her fasting blood sugar has been in the 140's to 160's over the last several months. Patient states that she is not able to trace back what may be causing this, she states she is compliant with diabetic diet.  Review of Systems:   No ROS.  Medicare Wellness Visit. Additional risk factors are reflected in the social history.  Cardiac Risk Factors include: advanced age (>87men, >85 women);diabetes mellitus;hypertension Sleep patterns: feels rested on waking and sleeps 5-6 hours nightly.   Home Safety/Smoke Alarms: Feels safe in home. Smoke alarms in place. Patient states she feels she is getting enough sleep and is well rested, discussed recommended sleep tips.   Living environment; residence and Firearm Safety: 2-story house, no firearms. Seat Belt Safety/Bike Helmet: Wears seat belt.     Objective:     Vitals: BP 124/62   Pulse 68   Resp 18   Ht 5\' 3"  (1.6 m)   Wt 190 lb (86.2 kg)   SpO2 98%   BMI 33.66 kg/m   Body mass index is 33.66 kg/m.  Advanced Directives 12/02/2017  Does Patient Have a Medical Advance Directive? Yes  Type of Paramedic of [place];Living will  Copy of [place] in Chart? No - copy requested    Tobacco Social History   Tobacco Use  Smoking Status Never Smoker  Smokeless Tobacco Never Used     Counseling given: Not Answered  Past Medical History:  Diagnosis Date  . Arthritis   . Diabetes mellitus   . GERD (gastroesophageal reflux disease)   . Hyperlipidemia   . Hypertension   . Retinitis pigmentosa    Past Surgical History:  Procedure Laterality Date  . ABDOMINAL HYSTERECTOMY    . CESAREAN SECTION     Family History  Problem Relation Age of Onset  . Hypertension Mother   . Diabetes Father   . Heart disease Father   . Hypertension Sister   . Diabetes  Brother   . Hypertension Brother   . Diabetes Son   . Breast cancer Other   . Colon cancer Neg Hx   . Stomach cancer Neg Hx    Social History   Socioeconomic History  . Marital status: Married    Spouse name: None  . Number of children: None  . Years of education: None  . Highest education level: None  Social Needs  . Financial resource strain: Not hard at all  . Food insecurity - worry: Never true  . Food insecurity - inability: Never true  . Transportation needs - medical: No  . Transportation needs - non-medical: No  Occupational History  . None  Tobacco Use  . Smoking status: Never Smoker  . Smokeless tobacco: Never Used  Substance and Sexual Activity  . Alcohol use: No  . Drug use: No  . Sexual activity: Yes    Partners: Male  Other Topics Concern  . None  Social History Narrative  . None    Outpatient Encounter Medications as of 12/02/2017  Medication Sig  . esomeprazole (NEXIUM) 20 MG capsule Take 20 mg by mouth daily at 12 noon.  Marland Kitchen glucose blood test strip Use BID to test CBG's  . JENTADUETO 2.5-500 MG TABS TAKE 1 TABLET TWICE A DAY  . losartan-hydrochlorothiazide (HYZAAR) 50-12.5 MG tablet TAKE 1 TABLET DAILY  . [DISCONTINUED] ondansetron (ZOFRAN) 4  MG tablet Take 1 tablet (4 mg total) by mouth every 8 (eight) hours as needed for nausea or vomiting.  . [DISCONTINUED] gabapentin (NEURONTIN) 300 MG capsule Take 1 capsule (300 mg total) by mouth at bedtime. (Patient not taking: Reported on 05/10/2017)  . [DISCONTINUED] lansoprazole (PREVACID) 15 MG capsule Take 15 mg by mouth daily.     Facility-Administered Encounter Medications as of 12/02/2017  Medication  . 0.9 %  sodium chloride infusion    Activities of Daily Living In your present state of health, do you have any difficulty performing the following activities: 12/02/2017  Hearing? N  Vision? N  Difficulty concentrating or making decisions? N  Walking or climbing stairs? N  Dressing or bathing? N  Doing  errands, shopping? N  Preparing Food and eating ? N  Using the Toilet? N  In the past six months, have you accidently leaked urine? N  Do you have problems with loss of bowel control? N  Managing your Medications? N  Managing your Finances? N  Housekeeping or managing your Housekeeping? N  Some recent data might be hidden    Patient Care Team: Hoyt Koch, MD as PCP - General (Internal Medicine)    Assessment:   This is a routine wellness examination for Patricia Elliott. Physical assessment deferred to PCP.  Exercise Activities and Dietary recommendations Current Exercise Habits: The patient does not participate in regular exercise at present, Exercise limited by: orthopedic condition(s)  Diet (meal preparation, eat out, water intake, caffeinated beverages, dairy products, fruits and vegetables): in general, a "healthy" diet  , well balanced, on average, 2 meals per day, Diet education was attached to patient's AVS.  Discussed not skipping meals for patient to use diabetic nutritional supplement or eat a small portion mid-day. Review weight loss strategies, Diet education was provided via handout.   Goals    . Patient Stated     Lose weight by use portion control with food, increase water intake, investigate the right fit for me to increase physical activity. Perhaps start to ride my stationary bike again.       Depression Screen PHQ 2/9 Scores 12/02/2017  PHQ - 2 Score 1  PHQ- 9 Score 1     Cognitive Function       Ad8 score reviewed for issues:  Issues making decisions: no  Less interest in hobbies / activities: no  Repeats questions, stories (family complaining): no  Trouble using ordinary gadgets (microwave, computer, phone):no  Forgets the month or year: no  Mismanaging finances: no  Remembering appts: no  Daily problems with thinking and/or memory: no Ad8 score is= 0    Immunization History  Administered Date(s) Administered  . Influenza Whole  10/25/2005  . Td 10/25/2006    Screening Tests Health Maintenance  Topic Date Due  . PNEUMOCOCCAL POLYSACCHARIDE VACCINE (1) 11/23/1956  . HIV Screening  11/23/1969  . TETANUS/TDAP  10/25/2016  . INFLUENZA VACCINE  05/25/2017  . FOOT EXAM  05/26/2017  . HEMOGLOBIN A1C  05/26/2017  . OPHTHALMOLOGY EXAM  08/18/2018  . MAMMOGRAM  08/09/2019  . COLONOSCOPY  05/11/2027  . Hepatitis C Screening  Completed      Plan:    Patient reports her fasting blood sugar has been in the 140's to 160's over the last several months. Patient states that she is not able to trace back what may be causing this, she states she is compliant with diabetic diet. Nurse scheduled a PCP appointment on 12/06/17 to discuss blood  sugar. Patient will also think about receiving flu and pneumonia vaccines during this upcoming visit, she declined them today.   Continue doing brain stimulating activities (puzzles, reading, adult coloring books, staying active) to keep memory sharp.   Continue to eat heart healthy diet (full of fruits, vegetables, whole grains, lean protein, water--limit salt, fat, and sugar intake) and increase physical activity as tolerated.   I have personally reviewed and noted the following in the patient's chart:   . Medical and social history . Use of alcohol, tobacco or illicit drugs  . Current medications and supplements . Functional ability and status . Nutritional status . Physical activity . Advanced directives . List of other physicians . Vitals . Screenings to include cognitive, depression, and falls . Referrals and appointments  In addition, I have reviewed and discussed with patient certain preventive protocols, quality metrics, and best practice recommendations. A written personalized care plan for preventive services as well as general preventive health recommendations were provided to patient.     Michiel Cowboy, RN  12/02/2017

## 2017-12-02 ENCOUNTER — Ambulatory Visit (INDEPENDENT_AMBULATORY_CARE_PROVIDER_SITE_OTHER): Payer: Medicare Other | Admitting: *Deleted

## 2017-12-02 VITALS — BP 124/62 | HR 68 | Resp 18 | Ht 63.0 in | Wt 190.0 lb

## 2017-12-02 DIAGNOSIS — Z0000 Encounter for general adult medical examination without abnormal findings: Secondary | ICD-10-CM | POA: Diagnosis not present

## 2017-12-02 DIAGNOSIS — E119 Type 2 diabetes mellitus without complications: Secondary | ICD-10-CM

## 2017-12-02 NOTE — Progress Notes (Signed)
Medical screening examination/treatment/procedure(s) were performed by non-physician practitioner and as supervising physician I was immediately available for consultation/collaboration. I agree with above. Elizabeth A Crawford, MD 

## 2017-12-02 NOTE — Patient Instructions (Addendum)
Premier food supplements any generic name brand like [employer] that is high protein, low sugar, low carbohydrate.  DVR PBS exercise at 5:30  Continue doing brain stimulating activities (puzzles, reading, adult coloring books, staying active) to keep memory sharp.   Continue to eat heart healthy diet (full of fruits, vegetables, whole grains, lean protein, water--limit salt, fat, and sugar intake) and increase physical activity as tolerated.  Think about getting flu and pneumonia vaccines during upcoming visit.   [name] , Thank you for taking time to come for your Medicare Wellness Visit. I appreciate your ongoing commitment to your health goals. Please review the following plan we discussed and let me know if I can assist you in the future.   These are the goals we discussed: Goals    . Patient Stated     Lose weight by use portion control with food, increase water intake, investigate the right fit for me to increase physical activity. Perhaps start to ride my stationary bike again.        This is a list of the screening recommended for you and due dates:  Health Maintenance  Topic Date Due  . Pneumococcal vaccine (1) [birth year]/1956  . HIV Screening  11/23/1969  . Tetanus Vaccine  10/25/2016  . Flu Shot  05/25/2017  . Complete foot exam   05/26/2017  . Hemoglobin A1C  05/26/2017  . Eye exam for diabetics  08/18/2018  . Mammogram  08/09/2019  . Colon Cancer Screening  05/11/2027  .  Hepatitis C: One time screening is recommended by Center for Disease Control  (CDC) for  adults born from [birth year] through 1966.   Completed     Knee Exercises Ask your health care provider which exercises are safe for you. Do exercises exactly as told by your health care provider and adjust them as directed. It is normal to feel mild stretching, pulling, tightness, or discomfort as you do these exercises, but you should stop right away if you feel sudden pain or your pain gets worse.Do not begin these  exercises until told by your health care provider. STRETCHING AND RANGE OF MOTION EXERCISES These exercises warm up your muscles and joints and improve the movement and flexibility of your knee. These exercises also help to relieve pain, numbness, and tingling. Exercise A: Knee Extension, Prone 1. Lie on your abdomen on a bed. 2. Place your left / right knee just beyond the edge of the surface so your knee is not on the bed. You can put a towel under your left / right thigh just above your knee for comfort. 3. Relax your leg muscles and allow gravity to straighten your knee. You should feel a stretch behind your left / right knee. 4. Hold this position for __________ seconds. 5. Scoot up so your knee is supported between repetitions. Repeat __________ times. Complete this stretch __________ times a day. Exercise B: Knee Flexion, Active  1. Lie on your back with both knees straight. If this causes back discomfort, bend your left / right knee so your foot is flat on the floor. 2. Slowly slide your left / right heel back toward your buttocks until you feel a gentle stretch in the front of your knee or thigh. 3. Hold this position for __________ seconds. 4. Slowly slide your left / right heel back to the starting position. Repeat __________ times. Complete this exercise __________ times a day. Exercise C: Quadriceps, Prone  1. Lie on your abdomen on a firm surface,  such as a bed or padded floor. 2. Bend your left / right knee and hold your ankle. If you cannot reach your ankle or pant leg, loop a belt around your foot and grab the belt instead. 3. Gently pull your heel toward your buttocks. Your knee should not slide out to the side. You should feel a stretch in the front of your thigh and knee. 4. Hold this position for __________ seconds. Repeat __________ times. Complete this stretch __________ times a day. Exercise D: Hamstring, Supine 1. Lie on your back. 2. Loop a belt or towel over the  ball of your left / right foot. The ball of your foot is on the walking surface, right under your toes. 3. Straighten your left / right knee and slowly pull on the belt to raise your leg until you feel a gentle stretch behind your knee. ? Do not let your left / right knee bend while you do this. ? Keep your other leg flat on the floor. 4. Hold this position for __________ seconds. Repeat __________ times. Complete this stretch __________ times a day. STRENGTHENING EXERCISES These exercises build strength and endurance in your knee. Endurance is the ability to use your muscles for a long time, even after they get tired. Exercise E: Quadriceps, Isometric  1. Lie on your back with your left / right leg extended and your other knee bent. Put a rolled towel or small pillow under your knee if told by your health care provider. 2. Slowly tense the muscles in the front of your left / right thigh. You should see your kneecap slide up toward your hip or see increased dimpling just above the knee. This motion will push the back of the knee toward the floor. 3. For __________ seconds, keep the muscle as tight as you can without increasing your pain. 4. Relax the muscles slowly and completely. Repeat __________ times. Complete this exercise __________ times a day. Exercise F: Straight Leg Raises - Quadriceps 1. Lie on your back with your left / right leg extended and your other knee bent. 2. Tense the muscles in the front of your left / right thigh. You should see your kneecap slide up or see increased dimpling just above the knee. Your thigh may even shake a bit. 3. Keep these muscles tight as you raise your leg 4-6 inches (10-15 cm) off the floor. Do not let your knee bend. 4. Hold this position for __________ seconds. 5. Keep these muscles tense as you lower your leg. 6. Relax your muscles slowly and completely after each repetition. Repeat __________ times. Complete this exercise __________ times a  day. Exercise G: Hamstring, Isometric 1. Lie on your back on a firm surface. 2. Bend your left / right knee approximately __________ degrees. 3. Dig your left / right heel into the surface as if you are trying to pull it toward your buttocks. Tighten the muscles in the back of your thighs to dig as hard as you can without increasing any pain. 4. Hold this position for __________ seconds. 5. Release the tension gradually and allow your muscles to relax completely for __________ seconds after each repetition. Repeat __________ times. Complete this exercise __________ times a day. Exercise H: Hamstring Curls  If told by your health care provider, do this exercise while wearing ankle weights. Begin with __________ weights. Then increase the weight by 1 lb (0.5 kg) increments. Do not wear ankle weights that are more than __________. 1. Lie on your  abdomen with your legs straight. 2. Bend your left / right knee as far as you can without feeling pain. Keep your hips flat against the floor. 3. Hold this position for __________ seconds. 4. Slowly lower your leg to the starting position.  Repeat __________ times. Complete this exercise __________ times a day. Exercise I: Squats (Quadriceps) 1. Stand in front of a table, with your feet and knees pointing straight ahead. You may rest your hands on the table for balance but not for support. 2. Slowly bend your knees and lower your hips like you are going to sit in a chair. ? Keep your weight over your heels, not over your toes. ? Keep your lower legs upright so they are parallel with the table legs. ? Do not let your hips go lower than your knees. ? Do not bend lower than told by your health care provider. ? If your knee pain increases, do not bend as low. 3. Hold the squat position for __________ seconds. 4. Slowly push with your legs to return to standing. Do not use your hands to pull yourself to standing. Repeat __________ times. Complete this  exercise __________ times a day. Exercise J: Wall Slides (Quadriceps)  1. Lean your back against a smooth wall or door while you walk your feet out 18-24 inches (46-61 cm) from it. 2. Place your feet hip-width apart. 3. Slowly slide down the wall or door until your knees bend __________ degrees. Keep your knees over your heels, not over your toes. Keep your knees in line with your hips. 4. Hold for __________ seconds. Repeat __________ times. Complete this exercise __________ times a day. Exercise K: Straight Leg Raises - Hip Abductors 1. Lie on your side with your left / right leg in the top position. Lie so your head, shoulder, knee, and hip line up. You may bend your bottom knee to help you keep your balance. 2. Roll your hips slightly forward so your hips are stacked directly over each other and your left / right knee is facing forward. 3. Leading with your heel, lift your top leg 4-6 inches (10-15 cm). You should feel the muscles in your outer hip lifting. ? Do not let your foot drift forward. ? Do not let your knee roll toward the ceiling. 4. Hold this position for __________ seconds. 5. Slowly return your leg to the starting position. 6. Let your muscles relax completely after each repetition. Repeat __________ times. Complete this exercise __________ times a day. Exercise L: Straight Leg Raises - Hip Extensors 1. Lie on your abdomen on a firm surface. You can put a pillow under your hips if that is more comfortable. 2. Tense the muscles in your buttocks and lift your left / right leg about 4-6 inches (10-15 cm). Keep your knee straight as you lift your leg. 3. Hold this position for __________ seconds. 4. Slowly lower your leg to the starting position. 5. Let your leg relax completely after each repetition. Repeat __________ times. Complete this exercise __________ times a day. This information is not intended to replace advice given to you by your health care provider. Make sure you  discuss any questions you have with your health care provider. Document Released: 08/25/2005 Document Revised: 07/05/2016 Document Reviewed: 08/17/2015 Elsevier Interactive Patient Education  2018 [employer].

## 2017-12-06 ENCOUNTER — Other Ambulatory Visit (INDEPENDENT_AMBULATORY_CARE_PROVIDER_SITE_OTHER): Payer: Medicare Other

## 2017-12-06 ENCOUNTER — Telehealth: Payer: Self-pay | Admitting: Internal Medicine

## 2017-12-06 ENCOUNTER — Encounter: Payer: Self-pay | Admitting: Internal Medicine

## 2017-12-06 ENCOUNTER — Ambulatory Visit (INDEPENDENT_AMBULATORY_CARE_PROVIDER_SITE_OTHER): Payer: Medicare Other | Admitting: Internal Medicine

## 2017-12-06 ENCOUNTER — Other Ambulatory Visit: Payer: Self-pay

## 2017-12-06 VITALS — BP 130/80 | HR 68 | Temp 97.7°F | Ht 63.0 in | Wt 191.0 lb

## 2017-12-06 DIAGNOSIS — E119 Type 2 diabetes mellitus without complications: Secondary | ICD-10-CM | POA: Diagnosis not present

## 2017-12-06 DIAGNOSIS — I10 Essential (primary) hypertension: Secondary | ICD-10-CM

## 2017-12-06 DIAGNOSIS — E1169 Type 2 diabetes mellitus with other specified complication: Secondary | ICD-10-CM | POA: Diagnosis not present

## 2017-12-06 DIAGNOSIS — K219 Gastro-esophageal reflux disease without esophagitis: Secondary | ICD-10-CM

## 2017-12-06 DIAGNOSIS — Z23 Encounter for immunization: Secondary | ICD-10-CM | POA: Diagnosis not present

## 2017-12-06 DIAGNOSIS — E785 Hyperlipidemia, unspecified: Secondary | ICD-10-CM

## 2017-12-06 LAB — CBC
HCT: 38.4 % (ref 36.0–46.0)
HEMOGLOBIN: 12.6 g/dL (ref 12.0–15.0)
MCHC: 32.8 g/dL (ref 30.0–36.0)
MCV: 88.2 fl (ref 78.0–100.0)
Platelets: 211 10*3/uL (ref 150.0–400.0)
RBC: 4.36 Mil/uL (ref 3.87–5.11)
RDW: 15.7 % — ABNORMAL HIGH (ref 11.5–15.5)
WBC: 5.8 10*3/uL (ref 4.0–10.5)

## 2017-12-06 LAB — LIPID PANEL
Cholesterol: 203 mg/dL — ABNORMAL HIGH (ref 0–200)
HDL: 40.9 mg/dL (ref >39.00)
LDL CALC: 130 mg/dL — AB (ref 0–99)
NONHDL: 162.22
Total CHOL/HDL Ratio: 5
Triglycerides: 162 mg/dL — ABNORMAL HIGH (ref 0.0–149.0)
VLDL: 32.4 mg/dL (ref 0.0–40.0)

## 2017-12-06 LAB — COMPREHENSIVE METABOLIC PANEL
ALT: 15 U/L (ref 0–35)
AST: 11 U/L (ref 0–37)
Albumin: 4.2 g/dL (ref 3.5–5.2)
Alkaline Phosphatase: 76 U/L (ref 39–117)
BILIRUBIN TOTAL: 0.4 mg/dL (ref 0.2–1.2)
BUN: 11 mg/dL (ref 6–23)
CHLORIDE: 104 meq/L (ref 96–112)
CO2: 28 meq/L (ref 19–32)
Calcium: 9.4 mg/dL (ref 8.4–10.5)
Creatinine, Ser: 0.73 mg/dL (ref 0.40–1.20)
GFR: 103.54 mL/min (ref >60.00)
GLUCOSE: 179 mg/dL — AB (ref 70–99)
Potassium: 4 mEq/L (ref 3.5–5.1)
Sodium: 139 mEq/L (ref 135–145)
Total Protein: 7.7 g/dL (ref 6.0–8.3)

## 2017-12-06 LAB — HEMOGLOBIN A1C: Hgb A1c MFr Bld: 8.1 % — ABNORMAL HIGH (ref 4.6–6.5)

## 2017-12-06 MED ORDER — LINAGLIPTIN-METFORMIN HCL 2.5-500 MG PO TABS: 1.0000 | ORAL_TABLET | Freq: Two times a day (BID) | ORAL | 1 refills | Status: DC

## 2017-12-06 MED ORDER — LOSARTAN POTASSIUM-HCTZ 50-12.5 MG PO TABS: 1.0000 | ORAL_TABLET | Freq: Every day | ORAL | 1 refills | Status: DC

## 2017-12-06 NOTE — Telephone Encounter (Signed)
Copied from [place]. Topic: Quick Communication - See Telephone Encounter >> Dec 06, 2017 11:11 AM Ether Griffins B wrote: CRM for notification. See Telephone encounter for:  Pt's medications Linagliptin-Metformin HCl and losartan-hydrochlorothiazide where sent to wrong pharmacy. [facility]  [facility]06/17.

## 2017-12-06 NOTE — Telephone Encounter (Signed)
[facility]

## 2017-12-06 NOTE — Progress Notes (Signed)
   Subjective:    Patient ID: Patricia Elliott, female    DOB: 01-08-1956, 62 y.o.   MRN: [id number]  HPI The patient is a 62 YO female coming in for follow up of medical conditions including diabetes (taking jantadueto, denies new numbness or weakness, no low sugars, denies high sugars, blurred vision, last eye exam within 1 year, diet has been poor lately), hypertension (taking losartan/hctz, denies side effects or chest pains or headaches), hyperlipidemia (not on medications at this time, goal LDL<100, denies chest pains or stroke symptoms), and gerd (taking nexium, denies symptoms, denies missing days).   Review of Systems  Constitutional: Negative.   HENT: Negative.   Eyes: Negative.   Respiratory: Negative for cough, chest tightness and shortness of breath.   Cardiovascular: Negative for chest pain, palpitations and leg swelling.  Gastrointestinal: Negative for abdominal distention, abdominal pain, constipation, diarrhea, nausea and vomiting.  Musculoskeletal: Negative.   Skin: Negative.   Neurological: Negative.   Psychiatric/Behavioral: Negative.       Objective:   Physical Exam  Constitutional: She is oriented to person, place, and time. She appears well-developed and well-nourished.  HENT:  Head: Normocephalic and atraumatic.  Eyes: EOM are normal.  Neck: Normal range of motion.  Cardiovascular: Normal rate and regular rhythm.  Pulmonary/Chest: Effort normal and breath sounds normal. No respiratory distress. She has no wheezes. She has no rales.  Abdominal: Soft. Bowel sounds are normal. She exhibits no distension. There is no tenderness. There is no rebound.  Musculoskeletal: She exhibits no edema.  Neurological: She is alert and oriented to person, place, and time. Coordination normal.  Skin: Skin is warm and dry.  Psychiatric: She has a normal mood and affect.   Vitals:   12/06/17 0823  BP: 130/80  Pulse: 68  Temp: 97.7 F (36.5 C)  TempSrc: Oral  SpO2: 98%    Weight: 191 lb (86.6 kg)  Height: 5\' 3"  (1.6 m)      Assessment & Plan:  Flu shot given at visit

## 2017-12-06 NOTE — Patient Instructions (Addendum)
We will check the labs today and call you back with the results.    Health Maintenance, Female Adopting a healthy lifestyle and getting preventive care can go a long way to promote health and wellness. Talk with your health care provider about what schedule of regular examinations is right for you. This is a good chance for you to check in with your provider about disease prevention and staying healthy. In between checkups, there are plenty of things you can do on your own. Experts have done a lot of research about which lifestyle changes and preventive measures are most likely to keep you healthy. Ask your health care provider for more information. Weight and diet Eat a healthy diet  Be sure to include plenty of vegetables, fruits, low-fat dairy products, and lean protein.  Do not eat a lot of foods high in solid fats, added sugars, or salt.  Get regular exercise. This is one of the most important things you can do for your health. ? Most adults should exercise for at least 150 minutes each week. The exercise should increase your heart rate and make you sweat (moderate-intensity exercise). ? Most adults should also do strengthening exercises at least twice a week. This is in addition to the moderate-intensity exercise.  Maintain a healthy weight  Body mass index (BMI) is a measurement that can be used to identify possible weight problems. It estimates body fat based on height and weight. Your health care provider can help determine your BMI and help you achieve or maintain a healthy weight.  For females 20 years of age and older: ? A BMI below 18.5 is considered underweight. ? A BMI of 18.5 to 24.9 is normal. ? A BMI of 25 to 29.9 is considered overweight. ? A BMI of 30 and above is considered obese.  Watch levels of cholesterol and blood lipids  You should start having your blood tested for lipids and cholesterol at 63 years of age, then have this test every 5 years.  You may need to  have your cholesterol levels checked more often if: ? Your lipid or cholesterol levels are high. ? You are older than 63 years of age. ? You are at high risk for heart disease.  Cancer screening Lung Cancer  Lung cancer screening is recommended for adults 55-80 years old who are at high risk for lung cancer because of a history of smoking.  A yearly low-dose CT scan of the lungs is recommended for people who: ? Currently smoke. ? Have quit within the past 15 years. ? Have at least a 30-pack-year history of smoking. A pack year is smoking an average of one pack of cigarettes a day for 1 year.  Yearly screening should continue until it has been 15 years since you quit.  Yearly screening should stop if you develop a health problem that would prevent you from having lung cancer treatment.  Breast Cancer  Practice breast self-awareness. This means understanding how your breasts normally appear and feel.  It also means doing regular breast self-exams. Let your health care provider know about any changes, no matter how small.  If you are in your 20s or 30s, you should have a clinical breast exam (CBE) by a health care provider every 1-3 years as part of a regular health exam.  If you are 40 or older, have a CBE every year. Also consider having a breast X-ray (mammogram) every year.  If you have a family history of breast cancer,   to your health care provider about genetic screening.  If you are at high risk for breast cancer, talk to your health care provider about having an MRI and a mammogram every year.  Breast cancer gene (BRCA) assessment is recommended for women who have family members with BRCA-related cancers. BRCA-related cancers include: ? Breast. ? Ovarian. ? Tubal. ? Peritoneal cancers.  Results of the assessment will determine the need for genetic counseling and BRCA1 and BRCA2 testing.  Cervical Cancer Your health care provider may recommend that you be screened  regularly for cancer of the pelvic organs (ovaries, uterus, and vagina). This screening involves a pelvic examination, including checking for microscopic changes to the surface of your cervix (Pap test). You may be encouraged to have this screening done every 3 years, beginning at age 21.  For women ages 30-65, health care providers may recommend pelvic exams and Pap testing every 3 years, or they may recommend the Pap and pelvic exam, combined with testing for human papilloma virus (HPV), every 5 years. Some types of HPV increase your risk of cervical cancer. Testing for HPV may also be done on women of any age with unclear Pap test results.  Other health care providers may not recommend any screening for nonpregnant women who are considered low risk for pelvic cancer and who do not have symptoms. Ask your health care provider if a screening pelvic exam is right for you.  If you have had past treatment for cervical cancer or a condition that could lead to cancer, you need Pap tests and screening for cancer for at least 20 years after your treatment. If Pap tests have been discontinued, your risk factors (such as having a new sexual partner) need to be reassessed to determine if screening should resume. Some women have medical problems that increase the chance of getting cervical cancer. In these cases, your health care provider may recommend more frequent screening and Pap tests.  Colorectal Cancer  This type of cancer can be detected and often prevented.  Routine colorectal cancer screening usually begins at 63 years of age and continues through 63 years of age.  Your health care provider may recommend screening at an earlier age if you have risk factors for colon cancer.  Your health care provider may also recommend using home test kits to check for hidden blood in the stool.  A small camera at the end of a tube can be used to examine your colon directly (sigmoidoscopy or colonoscopy). This is  done to check for the earliest forms of colorectal cancer.  Routine screening usually begins at age 50.  Direct examination of the colon should be repeated every 5-10 years through 63 years of age. However, you may need to be screened more often if early forms of precancerous polyps or small growths are found.  Skin Cancer  Check your skin from head to toe regularly.  Tell your health care provider about any new moles or changes in moles, especially if there is a change in a mole's shape or color.  Also tell your health care provider if you have a mole that is larger than the size of a pencil eraser.  Always use sunscreen. Apply sunscreen liberally and repeatedly throughout the day.  Protect yourself by wearing long sleeves, pants, a wide-brimmed hat, and sunglasses whenever you are outside.  Heart disease, diabetes, and high blood pressure  High blood pressure causes heart disease and increases the risk of stroke. High blood pressure is more   likely to develop in: ? People who have blood pressure in the high end of the normal range (130-139/85-89 mm Hg). ? People who are overweight or obese. ? People who are African American.  If you are 90-95 years of age, have your blood pressure checked every 3-5 years. If you are 4 years of age or older, have your blood pressure checked every year. You should have your blood pressure measured twice-once when you are at a hospital or clinic, and once when you are not at a hospital or clinic. Record the average of the two measurements. To check your blood pressure when you are not at a hospital or clinic, you can use: ? An automated blood pressure machine at a pharmacy. ? A home blood pressure monitor.  If you are between 44 years and 21 years old, ask your health care provider if you should take aspirin to prevent strokes.  Have regular diabetes screenings. This involves taking a blood sample to check your fasting blood sugar level. ? If you are  at a normal weight and have a low risk for diabetes, have this test once every three years after 63 years of age. ? If you are overweight and have a high risk for diabetes, consider being tested at a younger age or more often. Preventing infection Hepatitis B  If you have a higher risk for hepatitis B, you should be screened for this virus. You are considered at high risk for hepatitis B if: ? You were born in a country where hepatitis B is common. Ask your health care provider which countries are considered high risk. ? Your parents were born in a high-risk country, and you have not been immunized against hepatitis B (hepatitis B vaccine). ? You have HIV or AIDS. ? You use needles to inject street drugs. ? You live with someone who has hepatitis B. ? You have had sex with someone who has hepatitis B. ? You get hemodialysis treatment. ? You take certain medicines for conditions, including cancer, organ transplantation, and autoimmune conditions.  Hepatitis C  Blood testing is recommended for: ? Everyone born from [birth year] through 1966. ? Anyone with known risk factors for hepatitis C.  Sexually transmitted infections (STIs)  You should be screened for sexually transmitted infections (STIs) including gonorrhea and chlamydia if: ? You are sexually active and are younger than 63 years of age. ? You are older than 63 years of age and your health care provider tells you that you are at risk for this type of infection. ? Your sexual activity has changed since you were last screened and you are at an increased risk for chlamydia or gonorrhea. Ask your health care provider if you are at risk.  If you do not have HIV, but are at risk, it may be recommended that you take a prescription medicine daily to prevent HIV infection. This is called pre-exposure prophylaxis (PrEP). You are considered at risk if: ? You are sexually active and do not regularly use condoms or know the HIV status of your  partner(s). ? You take drugs by injection. ? You are sexually active with a partner who has HIV.  Talk with your health care provider about whether you are at high risk of being infected with HIV. If you choose to begin PrEP, you should first be tested for HIV. You should then be tested every 3 months for as long as you are taking PrEP. Pregnancy  If you are premenopausal and you may  become pregnant, ask your health care provider about preconception counseling.  If you may become pregnant, take 400 to 800 micrograms (mcg) of folic acid every day.  If you want to prevent pregnancy, talk to your health care provider about birth control (contraception). Osteoporosis and menopause  Osteoporosis is a disease in which the bones lose minerals and strength with aging. This can result in serious bone fractures. Your risk for osteoporosis can be identified using a bone density scan.  If you are 40 years of age or older, or if you are at risk for osteoporosis and fractures, ask your health care provider if you should be screened.  Ask your health care provider whether you should take a calcium or vitamin D supplement to lower your risk for osteoporosis.  Menopause may have certain physical symptoms and risks.  Hormone replacement therapy may reduce some of these symptoms and risks. Talk to your health care provider about whether hormone replacement therapy is right for you. Follow these instructions at home:  Schedule regular health, dental, and eye exams.  Stay current with your immunizations.  Do not use any tobacco products including cigarettes, chewing tobacco, or electronic cigarettes.  If you are pregnant, do not drink alcohol.  If you are breastfeeding, limit how much and how often you drink alcohol.  Limit alcohol intake to no more than 1 drink per day for nonpregnant women. One drink equals 12 ounces of beer, 5 ounces of wine, or 1 ounces of hard liquor.  Do not use street  drugs.  Do not share needles.  Ask your health care provider for help if you need support or information about quitting drugs.  Tell your health care provider if you often feel depressed.  Tell your health care provider if you have ever been abused or do not feel safe at home. This information is not intended to replace advice given to you by your health care provider. Make sure you discuss any questions you have with your health care provider. Document Released: 04/26/2011 Document Revised: 03/18/2016 Document Reviewed: 07/15/2015 Elsevier Interactive Patient Education  [employer].

## 2017-12-08 DIAGNOSIS — K219 Gastro-esophageal reflux disease without esophagitis: Secondary | ICD-10-CM | POA: Insufficient documentation

## 2017-12-08 NOTE — Assessment & Plan Note (Signed)
Taking nexium 20 mg daily and reminded her about trying to wean off. She is not getting symptoms and asked her to try every other day to see if this is adequate therapy.

## 2017-12-08 NOTE — Assessment & Plan Note (Signed)
Checking HgA1c, foot exam up to date. Eye exam up to date. Taking jentadueto and adjust as needed. Not complicated at this time.

## 2017-12-08 NOTE — Assessment & Plan Note (Signed)
Goal LDL <100. Checking lipid panel and adjust as needed. Currently on diet control.

## 2017-12-08 NOTE — Assessment & Plan Note (Signed)
Taking losartan/hctz and BP at goal. Checking CMP and adjust as needed.

## 2018-05-29 ENCOUNTER — Other Ambulatory Visit: Payer: Self-pay | Admitting: Internal Medicine

## 2018-06-06 ENCOUNTER — Encounter: Payer: Self-pay | Admitting: Internal Medicine

## 2018-06-06 ENCOUNTER — Other Ambulatory Visit (INDEPENDENT_AMBULATORY_CARE_PROVIDER_SITE_OTHER): Payer: Medicare Other

## 2018-06-06 ENCOUNTER — Ambulatory Visit (INDEPENDENT_AMBULATORY_CARE_PROVIDER_SITE_OTHER): Payer: Medicare Other | Admitting: Internal Medicine

## 2018-06-06 VITALS — BP 108/70 | HR 73 | Temp 98.6°F | Ht 63.0 in | Wt 182.0 lb

## 2018-06-06 DIAGNOSIS — E119 Type 2 diabetes mellitus without complications: Secondary | ICD-10-CM

## 2018-06-06 LAB — HEMOGLOBIN A1C: HEMOGLOBIN A1C: 7.3 % — AB (ref 4.6–6.5)

## 2018-06-06 NOTE — Progress Notes (Signed)
   Subjective:    Patient ID: Patricia Elliott, female    DOB: 13-Feb-1956, 62 y.o.   MRN: [id number]  HPI The patient is a 62 YO female coming in for follow up of her diabetes. Her HgA1c was 8.1 last visit which was about 2 points higher than prior. She denied missing doses of medications. She is taking jentadueto. She has made some changes to diet. Weight is down about 10 pounds in the last 6 months. She denies increasing her exercise level. She denies numbness or tingling. Denies chest pains or SOB. Denies abdominal problems. Morning sugars are sometimes around 180.  Review of Systems  Constitutional: Negative.   HENT: Negative.   Eyes: Negative.   Respiratory: Negative for cough, chest tightness and shortness of breath.   Cardiovascular: Negative for chest pain, palpitations and leg swelling.  Gastrointestinal: Negative for abdominal distention, abdominal pain, constipation, diarrhea, nausea and vomiting.  Musculoskeletal: Negative.   Skin: Negative.   Neurological: Negative.   Psychiatric/Behavioral: Negative.       Objective:   Physical Exam  Constitutional: She is oriented to person, place, and time. She appears well-developed and well-nourished.  HENT:  Head: Normocephalic and atraumatic.  Eyes: EOM are normal.  Neck: Normal range of motion.  Cardiovascular: Normal rate and regular rhythm.  Pulmonary/Chest: Effort normal and breath sounds normal. No respiratory distress. She has no wheezes. She has no rales.  Abdominal: Soft. Bowel sounds are normal. She exhibits no distension. There is no tenderness. There is no rebound.  Musculoskeletal: She exhibits no edema.  Neurological: She is alert and oriented to person, place, and time. Coordination normal.  Skin: Skin is warm and dry.  Psychiatric: She has a normal mood and affect.   Vitals:   06/06/18 0845  BP: 108/70  Pulse: 73  Temp: 98.6 F (37 C)  TempSrc: Oral  SpO2: 93%  Weight: 182 lb (82.6 kg)  Height: 5\' 3"  (1.6  m)      Assessment & Plan:

## 2018-06-06 NOTE — Patient Instructions (Signed)
We will check the HgA1c today and call you back about the results.    Diabetes Mellitus and Nutrition When you have diabetes (diabetes mellitus), it is very important to have healthy eating habits because your blood sugar (glucose) levels are greatly affected by what you eat and drink. Eating healthy foods in the appropriate amounts, at about the same times every day, can help you:  Control your blood glucose.  Lower your risk of heart disease.  Improve your blood pressure.  Reach or maintain a healthy weight.  Every person with diabetes is different, and each person has different needs for a meal plan. Your health care provider may recommend that you work with a diet and nutrition specialist (dietitian) to make a meal plan that is best for you. Your meal plan may vary depending on factors such as:  The calories you need.  The medicines you take.  Your weight.  Your blood glucose, blood pressure, and cholesterol levels.  Your activity level.  Other health conditions you have, such as heart or kidney disease.  How do carbohydrates affect me? Carbohydrates affect your blood glucose level more than any other type of food. Eating carbohydrates naturally increases the amount of glucose in your blood. Carbohydrate counting is a method for keeping track of how many carbohydrates you eat. Counting carbohydrates is important to keep your blood glucose at a healthy level, especially if you use insulin or take certain oral diabetes medicines. It is important to know how many carbohydrates you can safely have in each meal. This is different for every person. Your dietitian can help you calculate how many carbohydrates you should have at each meal and for snack. Foods that contain carbohydrates include:  Bread, cereal, rice, pasta, and crackers.  Potatoes and corn.  Peas, beans, and lentils.  Milk and yogurt.  Fruit and juice.  Desserts, such as cakes, cookies, ice cream, and  candy.  How does alcohol affect me? Alcohol can cause a sudden decrease in blood glucose (hypoglycemia), especially if you use insulin or take certain oral diabetes medicines. Hypoglycemia can be a life-threatening condition. Symptoms of hypoglycemia (sleepiness, dizziness, and confusion) are similar to symptoms of having too much alcohol. If your health care provider says that alcohol is safe for you, follow these guidelines:  Limit alcohol intake to no more than 1 drink per day for nonpregnant women and 2 drinks per day for men. One drink equals 12 oz of beer, 5 oz of wine, or 1 oz of hard liquor.  Do not drink on an empty stomach.  Keep yourself hydrated with water, diet soda, or unsweetened iced tea.  Keep in mind that regular soda, juice, and other mixers may contain a lot of sugar and must be counted as carbohydrates.  What are tips for following this plan? Reading food labels  Start by checking the serving size on the label. The amount of calories, carbohydrates, fats, and other nutrients listed on the label are based on one serving of the food. Many foods contain more than one serving per package.  Check the total grams (g) of carbohydrates in one serving. You can calculate the number of servings of carbohydrates in one serving by dividing the total carbohydrates by 15. For example, if a food has 30 g of total carbohydrates, it would be equal to 2 servings of carbohydrates.  Check the number of grams (g) of saturated and trans fats in one serving. Choose foods that have low or no amount of  these fats.  Check the number of milligrams (mg) of sodium in one serving. Most people should limit total sodium intake to less than 2,300 mg per day.  Always check the nutrition information of foods labeled as "low-fat" or "nonfat". These foods may be higher in added sugar or refined carbohydrates and should be avoided.  Talk to your dietitian to identify your daily goals for nutrients listed  on the label. Shopping  Avoid buying canned, premade, or processed foods. These foods tend to be high in fat, sodium, and added sugar.  Shop around the outside edge of the grocery store. This includes fresh fruits and vegetables, bulk grains, fresh meats, and fresh dairy. Cooking  Use low-heat cooking methods, such as baking, instead of high-heat cooking methods like deep frying.  Cook using healthy oils, such as olive, canola, or sunflower oil.  Avoid cooking with butter, cream, or high-fat meats. Meal planning  Eat meals and snacks regularly, preferably at the same times every day. Avoid going long periods of time without eating.  Eat foods high in fiber, such as fresh fruits, vegetables, beans, and whole grains. Talk to your dietitian about how many servings of carbohydrates you can eat at each meal.  Eat 4-6 ounces of lean protein each day, such as lean meat, chicken, fish, eggs, or tofu. 1 ounce is equal to 1 ounce of meat, chicken, or fish, 1 egg, or 1/4 cup of tofu.  Eat some foods each day that contain healthy fats, such as avocado, nuts, seeds, and fish. Lifestyle   Check your blood glucose regularly.  Exercise at least 30 minutes 5 or more days each week, or as told by your health care provider.  Take medicines as told by your health care provider.  Do not use any products that contain nicotine or tobacco, such as cigarettes and e-cigarettes. If you need help quitting, ask your health care provider.  Work with a Social worker or diabetes educator to identify strategies to manage stress and any emotional and social challenges. What are some questions to ask my health care provider?  Do I need to meet with a diabetes educator?  Do I need to meet with a dietitian?  What number can I call if I have questions?  When are the best times to check my blood glucose? Where to find more information:  American Diabetes Association: diabetes.org/food-and-fitness/food  Academy  of Nutrition and Dietetics: PokerClues.dk  [employer] of Diabetes and Digestive and Kidney Diseases (NIH): ContactWire.be Summary  A healthy meal plan will help you control your blood glucose and maintain a healthy lifestyle.  Working with a diet and nutrition specialist (dietitian) can help you make a meal plan that is best for you.  Keep in mind that carbohydrates and alcohol have immediate effects on your blood glucose levels. It is important to count carbohydrates and to use alcohol carefully. This information is not intended to replace advice given to you by your health care provider. Make sure you discuss any questions you have with your health care provider. Document Released: 07/08/2005 Document Revised: 11/15/2016 Document Reviewed: 11/15/2016 Elsevier Interactive Patient Education  [employer].

## 2018-06-06 NOTE — Assessment & Plan Note (Addendum)
Checking HgA1c and adjust as needed. She is in a mild flare of her diabetes with last HgA1c slightly above goal of 8.1. She has made some changes to diet and weight is down about 10 pounds which hopefully will be reflected in her HgA1c. Adjust as needed.

## 2018-06-08 ENCOUNTER — Telehealth: Payer: Self-pay | Admitting: Internal Medicine

## 2018-06-08 NOTE — Telephone Encounter (Signed)
Results reviewed in result notes 

## 2018-06-08 NOTE — Telephone Encounter (Signed)
Copied from [place] [id number]. Topic: Quick Communication - Lab Results >> Jun 08, 2018  8:19 AM Cecelia Byars, NT wrote: Patient returned call for lab results please call her at [phone]

## 2018-07-07 ENCOUNTER — Other Ambulatory Visit: Payer: Self-pay | Admitting: Internal Medicine

## 2018-07-07 DIAGNOSIS — Z1231 Encounter for screening mammogram for malignant neoplasm of breast: Secondary | ICD-10-CM

## 2018-08-11 ENCOUNTER — Ambulatory Visit
Admission: RE | Admit: 2018-08-11 | Discharge: 2018-08-11 | Disposition: A | Discharge status: Home / Self Care | Payer: Medicare Other | Source: Ambulatory Visit | Attending: Internal Medicine | Admitting: Internal Medicine

## 2018-08-11 DIAGNOSIS — Z1231 Encounter for screening mammogram for malignant neoplasm of breast: Secondary | ICD-10-CM

## 2018-08-24 DIAGNOSIS — H2513 Age-related nuclear cataract, bilateral: Secondary | ICD-10-CM | POA: Diagnosis not present

## 2018-08-24 DIAGNOSIS — H3552 Pigmentary retinal dystrophy: Secondary | ICD-10-CM | POA: Diagnosis not present

## 2018-08-24 DIAGNOSIS — E119 Type 2 diabetes mellitus without complications: Secondary | ICD-10-CM | POA: Diagnosis not present

## 2018-08-24 LAB — HM DIABETES EYE EXAM

## 2018-08-28 ENCOUNTER — Encounter: Payer: Self-pay | Admitting: Family

## 2018-08-28 ENCOUNTER — Ambulatory Visit (INDEPENDENT_AMBULATORY_CARE_PROVIDER_SITE_OTHER): Payer: Medicare Other | Admitting: Family

## 2018-08-28 VITALS — BP 126/78 | HR 77 | Temp 97.9°F | Ht 63.0 in | Wt 187.0 lb

## 2018-08-28 DIAGNOSIS — H6690 Otitis media, unspecified, unspecified ear: Secondary | ICD-10-CM

## 2018-08-28 DIAGNOSIS — M545 Low back pain, unspecified: Secondary | ICD-10-CM

## 2018-08-28 MED ORDER — PREDNISONE 20 MG PO TABS: 20.0000 mg | ORAL_TABLET | Freq: Every day | ORAL | 0 refills | Status: DC

## 2018-08-28 MED ORDER — AZITHROMYCIN 250 MG PO TABS: ORAL_TABLET | ORAL | 0 refills | Status: DC

## 2018-08-28 MED ORDER — METHOCARBAMOL 500 MG PO TABS: 500.0000 mg | ORAL_TABLET | Freq: Three times a day (TID) | ORAL | 0 refills | Status: DC | PRN

## 2018-08-28 NOTE — Progress Notes (Signed)
Patricia Elliott is a 62 y.o. female with the following history as recorded in Epic[facility]:  Patient Active Problem List   Diagnosis Date Noted  . GERD (gastroesophageal reflux disease) 12/08/2017  . LLQ pain 02/15/2017  . Routine general medical examination at a health care facility 11/26/2016  . Obesity (BMI 30-39.9) 05/10/2014  . Diabetes mellitus type 2, controlled, without complications ([place]) 54/27/0623  . Hyperlipidemia associated with type 2 diabetes mellitus ([place]) 07/07/2007  . RETINITIS PIGMENTOSA 07/07/2007  . Essential hypertension 05/18/2007  . RAYNAUD'S SYNDROME 05/18/2007    Current Outpatient Medications  Medication Sig Dispense Refill  . esomeprazole (NEXIUM) 20 MG capsule Take 20 mg by mouth daily at 12 noon.    Marland Kitchen glucose blood test strip Use BID to test CBG's 100 each 3  . JENTADUETO 2.5-500 MG TABS TAKE 1 TABLET TWICE A DAY 180 tablet 1  . losartan-hydrochlorothiazide (HYZAAR) 50-12.5 MG tablet TAKE 1 TABLET DAILY 90 tablet 1  . Multiple Vitamin (MULTIVITAMIN) tablet Take 1 tablet by mouth daily.    Marland Kitchen azithromycin (ZITHROMAX) 250 MG tablet 2 tabs po qd x 1 day; 1 tablet per day x 4 days; 6 tablet 0  . methocarbamol (ROBAXIN) 500 MG tablet Take 1 tablet (500 mg total) by mouth every 8 (eight) hours as needed for muscle spasms. 30 tablet 0  . predniSONE (DELTASONE) 20 MG tablet Take 1 tablet (20 mg total) by mouth daily with breakfast. 5 tablet 0   No current facility-administered medications for this visit.     Allergies: Patient has no known allergies.  Past Medical History:  Diagnosis Date  . Arthritis   . Diabetes mellitus   . GERD (gastroesophageal reflux disease)   . Hyperlipidemia   . Hypertension   . Retinitis pigmentosa     Past Surgical History:  Procedure Laterality Date  . ABDOMINAL HYSTERECTOMY    . CESAREAN SECTION      Family History  Problem Relation Age of Onset  . Hypertension Mother   . Diabetes Father   . Heart disease Father   .  Hypertension Sister   . Diabetes Brother   . Hypertension Brother   . Diabetes Son   . Breast cancer Other   . Colon cancer Neg Hx   . Stomach cancer Neg Hx     Social History   Tobacco Use  . Smoking status: Never Smoker  . Smokeless tobacco: Never Used  Substance Use Topics  . Alcohol use: No    Subjective:  Low back pain x 1 week; no known history of injury or trauma; feels like having muscle spasms in low back; known arthritis changes in back; feels like tingling into right extremity; using Aleve extra strength; no changes in bowel or bladder habits;   Also feels like hearing is down in the past 24 hours; denies any fever, pain; notes that she has recently had "raspy cough." Not prone to having problems with ear wax issues.   Objective:  Vitals:   08/28/18 1517  BP: 126/78  Pulse: 77  Temp: 97.9 F (36.6 C)  TempSrc: Oral  SpO2: 99%  Weight: 187 lb 0.6 oz (84.8 kg)  Height: 5\' 3"  (1.6 m)    General: Well developed, well nourished, in no acute distress  Skin : Warm and dry.  Head: Normocephalic and atraumatic  Eyes: Sclera and conjunctiva clear; pupils round and reactive to light; extraocular movements intact  Ears: External normal; canals clear; right tympanic membrane erythematous Oropharynx: Pink, supple.  No suspicious lesions  Neck: Supple without thyromegaly, adenopathy  Lungs: Respirations unlabored; clear to auscultation bilaterally without wheeze, rales, rhonchi  CVS exam: normal rate and regular rhythm.  Musculoskeletal: No deformities; no active joint inflammation  Extremities: No edema, cyanosis, clubbing  Vessels: Symmetric bilaterally  Neurologic: Alert and oriented; speech intact; face symmetrical; moves all extremities well; CNII-XII intact without focal deficit   Assessment:  1. Acute otitis media, unspecified otitis media type   2. Midline low back pain without sciatica, unspecified chronicity     Plan:  1. Rx for Z-pak #1 take as directed; 2.  Rx for Prednisone 20 mg qd x 5 days, Robaxin 500 mg tid prn; apply heat, rest; follow up worse, no better.   No follow-ups on file.  No orders of the defined types were placed in this encounter.   Requested Prescriptions   Signed Prescriptions Disp Refills  . methocarbamol (ROBAXIN) 500 MG tablet 30 tablet 0    Sig: Take 1 tablet (500 mg total) by mouth every 8 (eight) hours as needed for muscle spasms.  . predniSONE (DELTASONE) 20 MG tablet 5 tablet 0    Sig: Take 1 tablet (20 mg total) by mouth daily with breakfast.  . azithromycin (ZITHROMAX) 250 MG tablet 6 tablet 0    Sig: 2 tabs po qd x 1 day; 1 tablet per day x 4 days;

## 2018-08-31 ENCOUNTER — Encounter: Payer: Self-pay | Admitting: Internal Medicine

## 2018-08-31 NOTE — Progress Notes (Signed)
Abstracted and sent to scan  

## 2018-10-16 NOTE — Progress Notes (Signed)
Chief Complaint  Patient presents with  . cold like symptoms    Been going on since sunday. Having chest congestion and productive cough with "white" mucus    HPI: Patricia Elliott 62 y.o.   SDA PCP Dr Sharlet Salina  Here with spouse for acut visit  has Diabetes mellitus type 2, controlled, without complications ([place]); Hyperlipidemia associated with type 2 diabetes mellitus ([place]); RETINITIS PIGMENTOSA; Essential hypertension; RAYNAUD'S SYNDROME; Obesity (BMI 30-39.9); Routine general medical examination at a health care facility; LLQ pain; and GERD (gastroesophageal reflux disease) on their problem list.  Onset  3-4 fdays   Chest congestion and cough   No fever last night  Some hot .  No sob excpet perhaps at end of day tired   Had  rx .  otc  Cough med coricidin  Tired  At night coughing .  No hemoptysis   A month ago had  Dx om when seen for back pain and given z pack and pred  Back pain is better .   Ear right  Dec hearing and now  loike a saqueaky door  But no sig upper rhinitis or sinu pain or drainage per  ROS: See pertinent positives and negatives per HPI. No cp sob  No hx of lung disease asthma   Past Medical History:  Diagnosis Date  . Arthritis   . Diabetes mellitus   . GERD (gastroesophageal reflux disease)   . Hyperlipidemia   . Hypertension   . Retinitis pigmentosa     Family History  Problem Relation Age of Onset  . Hypertension Mother   . Diabetes Father   . Heart disease Father   . Hypertension Sister   . Diabetes Brother   . Hypertension Brother   . Diabetes Son   . Breast cancer Other   . Colon cancer Neg Hx   . Stomach cancer Neg Hx     Social History   Socioeconomic History  . Marital status: Married    Spouse name: Not on file  . Number of children: Not on file  . Years of education: Not on file  . Highest education level: Not on file  Occupational History  . Not on file  Social Needs  . Financial resource strain: Not hard at all  . Food  insecurity:    Worry: Never true    Inability: Never true  . Transportation needs:    Medical: No    Non-medical: No  Tobacco Use  . Smoking status: Never Smoker  . Smokeless tobacco: Never Used  Substance and Sexual Activity  . Alcohol use: No  . Drug use: No  . Sexual activity: Yes    Partners: Male  Lifestyle  . Physical activity:    Days per week: 0 days    Minutes per session: 0 min  . Stress: Rather much  Relationships  . Social connections:    Talks on phone: More than three times a week    Gets together: More than three times a week    Attends religious service: More than 4 times per year    Active member of club or organization: Not on file    Attends meetings of clubs or organizations: More than 4 times per year    Relationship status: Married  Other Topics Concern  . Not on file  Social History Narrative  . Not on file    Outpatient Medications Prior to Visit  Medication Sig Dispense Refill  . esomeprazole ([place]) 20  MG capsule Take 20 mg by mouth daily at 12 noon.    Marland Kitchen glucose blood test strip Use BID to test CBG's 100 each 3  . JENTADUETO 2.5-500 MG TABS TAKE 1 TABLET TWICE A DAY 180 tablet 1  . losartan-hydrochlorothiazide (HYZAAR) 50-12.5 MG tablet TAKE 1 TABLET DAILY 90 tablet 1  . methocarbamol (ROBAXIN) 500 MG tablet Take 1 tablet (500 mg total) by mouth every 8 (eight) hours as needed for muscle spasms. 30 tablet 0  . Multiple Vitamin (MULTIVITAMIN) tablet Take 1 tablet by mouth daily.    Marland Kitchen azithromycin (ZITHROMAX) 250 MG tablet 2 tabs po qd x 1 day; 1 tablet per day x 4 days; 6 tablet 0  . predniSONE (DELTASONE) 20 MG tablet Take 1 tablet (20 mg total) by mouth daily with breakfast. 5 tablet 0   No facility-administered medications prior to visit.      EXAM:  BP 126/82 (BP Location: Left Arm, Patient Position: Sitting, Cuff Size: Large)   Pulse 92   Temp 98.4 F (36.9 C) (Oral)   Wt 182 lb 12.8 oz (82.9 kg)   SpO2 97%   BMI 32.38 kg/m    Body mass index is 32.38 kg/m.  GENERAL: vitals reviewed and listed above, alert, oriented, appears well hydrated and in no acute distress  ocass cough brfonchial non toxic  HEENT: atraumatic, conjunctiva  clear, no obvious abnormalities on inspection of external nose and ears lef tm nl right slight redness at pars flaccida but nl light reflex   Posterior ? Clear fluid OP : no lesion edema or exudate  Mild erythema face NT  NECK: no obvious masses on inspection palpation  LUNGS: clear to auscultation bilaterally, no wheezes, rales or rhonchi, good air movement had musicla sound right base that cleared with coughing  CV: HRRR, no clubbing cyanosis or  peripheral edema nl cap refill  MS: moves all extremities without noticeable focal  abnormality PSYCH: pleasant and cooperative, no obvious depression or anxiety  ASSESSMENT AND PLAN:  Discussed the following assessment and plan:  Respiratory tract congestion with cough  Dysfunction of right eustachian tube - s/p  rx for  om without pain .   expectant managment    Expectant management. And  Symptomatic rx at this time and fu with pcp with any alarm sx or  No resolution of ear sx    Cough med for comfort with cautionto use at night  -Patient advised to return or notify health care team  if symptoms worsen ,persist or new concerns arise.  Patient Instructions  Chest is clear today and   Exam is reassuring  Acts like a viral respiratory infection that  Should improve on its own cough med fluids for comfort .  The right ear is almost normal and should improve over the next week or so .   If not feeling better in another  7-10 days or worse then make appt for fu with your PCP .   Fu with fever chills  Severe pain  Etc       Wanda K. Panosh M.D.

## 2018-10-17 ENCOUNTER — Ambulatory Visit: Payer: Self-pay | Admitting: *Deleted

## 2018-10-17 ENCOUNTER — Encounter: Payer: Self-pay | Admitting: Internal Medicine

## 2018-10-17 ENCOUNTER — Ambulatory Visit (INDEPENDENT_AMBULATORY_CARE_PROVIDER_SITE_OTHER): Payer: Medicare Other | Admitting: Internal Medicine

## 2018-10-17 VITALS — BP 126/82 | HR 92 | Temp 98.4°F | Wt 182.8 lb

## 2018-10-17 DIAGNOSIS — H6981 Other specified disorders of Eustachian tube, right ear: Secondary | ICD-10-CM | POA: Diagnosis not present

## 2018-10-17 DIAGNOSIS — R058 Other specified cough: Secondary | ICD-10-CM

## 2018-10-17 DIAGNOSIS — R05 Cough: Secondary | ICD-10-CM

## 2018-10-17 MED ORDER — PROMETHAZINE-DM 6.25-15 MG/5ML PO SYRP: 2.5000 mL | ORAL_SOLUTION | Freq: Four times a day (QID) | ORAL | 0 refills | Status: DC | PRN

## 2018-10-17 NOTE — Telephone Encounter (Signed)
Message from Sheran Luz sent at 10/17/2018 1:40 PM EST   Summary: OTC cough medication suggestions    Patient called stating that she was prescribed promethazine-dextromethorphan (PROMETHAZINE-DM) 6.25-15 MG/5ML syrup today but pharmacist advised her that this medication is on back order. Patient is requesting a call back from nurse to discuss what she can take OTC for cough. Please advise.          Called patient regarding advice for over the counter meds for cough. Pt advised on drinking lots of liquids, warm drinks, with honey and lemon. Use a humidifier to put moisture in the air. Try taking coricidin for chest congestion and cough.  Ask the pharmacist for recommendations. She denies fever. Cough is mostly dry. Cough is worst at night. Will call back Thursday to let the office know how she is doing and or to make an appointment.

## 2018-10-17 NOTE — Patient Instructions (Addendum)
Chest is clear today and   Exam is reassuring  Acts like a viral respiratory infection that  Should improve on its own cough med fluids for comfort .  The right ear is almost normal and should improve over the next week or so .   If not feeling better in another  7-10 days or worse then make appt for fu with your PCP .   Fu with fever chills  Severe pain  Etc

## 2018-10-19 ENCOUNTER — Telehealth: Payer: Self-pay

## 2018-10-19 NOTE — Telephone Encounter (Signed)
error 

## 2018-10-19 NOTE — Telephone Encounter (Signed)
Ok to use otc

## 2018-10-19 NOTE — Telephone Encounter (Signed)
treid calling patient multiple times but phone was busy routing to [place] patient calls back

## 2018-11-20 ENCOUNTER — Other Ambulatory Visit: Payer: Self-pay | Admitting: Internal Medicine

## 2018-12-04 ENCOUNTER — Ambulatory Visit: Payer: Self-pay

## 2018-12-04 NOTE — Telephone Encounter (Signed)
Pt calling stating that the pharmacy told her that the losartan-hydrochlorothiazide (HYZAAR) 50-12.5 MG tablet Is on back order and would like to know if she need another medication  Pt has enough for at least 28 more days    [facility] [place], [address] [phone] (Phone) [phone] (Fax)    Pt. Has an appointment with Dr. Sharlet Salina and will discuss this at that visit.

## 2018-12-14 NOTE — Progress Notes (Deleted)
Subjective:   Patricia Elliott is a 63 y.o. female who presents for Medicare Annual (Subsequent) preventive examination.  Review of Systems:  No ROS.  Medicare Wellness Visit. Additional risk factors are reflected in the social history.    Sleep patterns: {SX; SLEEP PATTERNS:18802::"feels rested on waking","does not get up to void","gets up *** times nightly to void","sleeps *** hours nightly"}.    Home Safety/Smoke Alarms: Feels safe in home. Smoke alarms in place.  Living environment; residence and Firearm Safety: {Rehab home environment / accessibility:30080::"no firearms","firearms stored safely"}. Seat Belt Safety/Bike Helmet: Wears seat belt.      Objective:     Vitals: There were no vitals taken for this visit.  There is no height or weight on file to calculate BMI.  Advanced Directives 12/02/2017  Does Patient Have a Medical Advance Directive? Yes  Type of Paramedic of [place];Living will  Copy of [place] in Chart? No - copy requested    Tobacco Social History   Tobacco Use  Smoking Status Never Smoker  Smokeless Tobacco Never Used     Counseling given: Not Answered  Past Medical History:  Diagnosis Date  . Arthritis   . Diabetes mellitus   . GERD (gastroesophageal reflux disease)   . Hyperlipidemia   . Hypertension   . Retinitis pigmentosa    Past Surgical History:  Procedure Laterality Date  . ABDOMINAL HYSTERECTOMY    . CESAREAN SECTION     Family History  Problem Relation Age of Onset  . Hypertension Mother   . Diabetes Father   . Heart disease Father   . Hypertension Sister   . Diabetes Brother   . Hypertension Brother   . Diabetes Son   . Breast cancer Other   . Colon cancer Neg Hx   . Stomach cancer Neg Hx    Social History   Socioeconomic History  . Marital status: Married    Spouse name: Not on file  . Number of children: Not on file  . Years of education: Not on file  . Highest  education level: Not on file  Occupational History  . Not on file  Social Needs  . Financial resource strain: Not hard at all  . Food insecurity:    Worry: Never true    Inability: Never true  . Transportation needs:    Medical: No    Non-medical: No  Tobacco Use  . Smoking status: Never Smoker  . Smokeless tobacco: Never Used  Substance and Sexual Activity  . Alcohol use: No  . Drug use: No  . Sexual activity: Yes    Partners: Male  Lifestyle  . Physical activity:    Days per week: 0 days    Minutes per session: 0 min  . Stress: Rather much  Relationships  . Social connections:    Talks on phone: More than three times a week    Gets together: More than three times a week    Attends religious service: More than 4 times per year    Active member of club or organization: Not on file    Attends meetings of clubs or organizations: More than 4 times per year    Relationship status: Married  Other Topics Concern  . Not on file  Social History Narrative  . Not on file    Outpatient Encounter Medications as of 12/15/2018  Medication Sig  . esomeprazole (NEXIUM) 20 MG capsule Take 20 mg by mouth daily at  12 noon.  Marland Kitchen glucose blood test strip Use BID to test CBG's  . JENTADUETO 2.5-500 MG TABS TAKE 1 TABLET TWICE A DAY  . losartan-hydrochlorothiazide (HYZAAR) 50-12.5 MG tablet TAKE 1 TABLET DAILY  . methocarbamol (ROBAXIN) 500 MG tablet Take 1 tablet (500 mg total) by mouth every 8 (eight) hours as needed for muscle spasms.  . Multiple Vitamin (MULTIVITAMIN) tablet Take 1 tablet by mouth daily.  . promethazine-dextromethorphan (PROMETHAZINE-DM) 6.25-15 MG/5ML syrup Take 2.5-5 mLs by mouth 4 (four) times daily as needed for cough.   No facility-administered encounter medications on file as of 12/15/2018.     Activities of Daily Living No flowsheet data found.  Patient Care Team: Hoyt Koch, MD as PCP - General (Internal Medicine)    Assessment:   This is a  routine wellness examination for [name]. Physical assessment deferred to PCP.  Exercise Activities and Dietary recommendations   Sleep patterns: {SX; SLEEP PATTERNS:18802::"feels rested on waking","does not get up to void","gets up *** times nightly to void","sleeps *** hours nightly"}.    Home Safety/Smoke Alarms: Feels safe in home. Smoke alarms in place.  Living environment; residence and Firearm Safety: {Rehab home environment / accessibility:30080::"no firearms","firearms stored safely"}. Seat Belt Safety/Bike Helmet: Wears seat belt.  Goals    . Patient Stated     Lose weight by use portion control with food, increase water intake, investigate the right fit for me to increase physical activity. Perhaps start to ride my stationary bike again.        Fall Risk Fall Risk  12/02/2017  Falls in the past year? No   Depression Screen PHQ 2/9 Scores 12/02/2017  PHQ - 2 Score 1  PHQ- 9 Score 1     Cognitive Function        Immunization History  Administered Date(s) Administered  . Influenza Whole 10/25/2005  . Influenza,inj,Quad PF,6+ Mos 12/06/2017  . Td 10/25/2006   Screening Tests Health Maintenance  Topic Date Due  . PNEUMOCOCCAL POLYSACCHARIDE VACCINE AGE 73-64 HIGH RISK  11/23/1956  . HIV Screening  11/23/1969  . TETANUS/TDAP  10/25/2016  . FOOT EXAM  12/02/2018  . HEMOGLOBIN A1C  12/07/2018  . INFLUENZA VACCINE  01/24/2019 (Originally 05/25/2018)  . OPHTHALMOLOGY EXAM  08/25/2019  . MAMMOGRAM  08/11/2020  . COLONOSCOPY  05/11/2027  . Hepatitis C Screening  Completed      Plan:    I have personally reviewed and noted the following in the patient's chart:   . Medical and social history . Use of alcohol, tobacco or illicit drugs  . Current medications and supplements . Functional ability and status . Nutritional status . Physical activity . Advanced directives . List of other physicians . Vitals . Screenings to include cognitive, depression, and  falls . Referrals and appointments  In addition, I have reviewed and discussed with patient certain preventive protocols, quality metrics, and best practice recommendations. A written personalized care plan for preventive services as well as general preventive health recommendations were provided to patient.     Michiel Cowboy, RN  12/14/2018

## 2018-12-15 ENCOUNTER — Ambulatory Visit: Payer: Medicare Other

## 2018-12-15 ENCOUNTER — Ambulatory Visit: Payer: Medicare Other | Admitting: Internal Medicine

## 2018-12-18 NOTE — Progress Notes (Signed)
Subjective:   Patricia Elliott is a 63 y.o. female who presents for Medicare Annual (Subsequent) preventive examination.  Review of Systems:  No ROS.  Medicare Wellness Visit. Additional risk factors are reflected in the social history.  Cardiac Risk Factors include: advanced age (>29men, >24 women);diabetes mellitus;dyslipidemia;hypertension Sleep patterns: feels rested on waking, gets up 1 times nightly to void and sleeps 7 hours nightly.    Home Safety/Smoke Alarms: Feels safe in home. Smoke alarms in place.  Living environment; residence and Firearm Safety: 1-story house/ trailer Lives with husband, no needs for DME, good support system. Seat Belt Safety/Bike Helmet: Wears seat belt.     Objective:     Vitals: BP (!) 146/84   Pulse 79   Temp 98.4 F (36.9 C)   Resp 17   Ht 5\' 3"  (1.6 m)   Wt 188 lb (85.3 kg)   SpO2 98%   BMI 33.30 kg/m   Body mass index is 33.3 kg/m.  Advanced Directives 12/19/2018 12/02/2017  Does Patient Have a Medical Advance Directive? Yes Yes  Type of Paramedic of [place];Living will [place];Living will  Copy of [place] in Chart? No - copy requested No - copy requested    Tobacco Social History   Tobacco Use  Smoking Status Never Smoker  Smokeless Tobacco Never Used     Counseling given: Not Answered  Past Medical History:  Diagnosis Date  . Arthritis   . Diabetes mellitus   . GERD (gastroesophageal reflux disease)   . Hyperlipidemia   . Hypertension   . Retinitis pigmentosa    Past Surgical History:  Procedure Laterality Date  . ABDOMINAL HYSTERECTOMY    . CESAREAN SECTION     Family History  Problem Relation Age of Onset  . Hypertension Mother   . Diabetes Father   . Heart disease Father   . Hypertension Sister   . Diabetes Brother   . Hypertension Brother   . Diabetes Son   . Breast cancer Other   . Colon cancer Neg Hx   . Stomach cancer Neg Hx     Social History   Socioeconomic History  . Marital status: Married    Spouse name: Not on file  . Number of children: 1  . Years of education: Not on file  . Highest education level: Not on file  Occupational History  . Not on file  Social Needs  . Financial resource strain: Not hard at all  . Food insecurity:    Worry: Never true    Inability: Never true  . Transportation needs:    Medical: No    Non-medical: No  Tobacco Use  . Smoking status: Never Smoker  . Smokeless tobacco: Never Used  Substance and Sexual Activity  . Alcohol use: No  . Drug use: No  . Sexual activity: Yes    Partners: Male  Lifestyle  . Physical activity:    Days per week: 0 days    Minutes per session: 0 min  . Stress: Only a little  Relationships  . Social connections:    Talks on phone: More than three times a week    Gets together: More than three times a week    Attends religious service: More than 4 times per year    Active member of club or organization: Yes    Attends meetings of clubs or organizations: More than 4 times per year    Relationship status:  Married  Other Topics Concern  . Not on file  Social History Narrative  . Not on file    Outpatient Encounter Medications as of 12/19/2018  Medication Sig  . esomeprazole (NEXIUM) 20 MG capsule Take 20 mg by mouth daily at 12 noon.  Marland Kitchen glucose blood test strip Use BID to test CBG's  . JENTADUETO 2.5-500 MG TABS TAKE 1 TABLET TWICE A DAY  . losartan-hydrochlorothiazide (HYZAAR) 50-12.5 MG tablet TAKE 1 TABLET DAILY  . Multiple Vitamin (MULTIVITAMIN) tablet Take 1 tablet by mouth daily.  . [DISCONTINUED] methocarbamol (ROBAXIN) 500 MG tablet Take 1 tablet (500 mg total) by mouth every 8 (eight) hours as needed for muscle spasms. (Patient not taking: Reported on 12/19/2018)  . [DISCONTINUED] promethazine-dextromethorphan (PROMETHAZINE-DM) 6.25-15 MG/5ML syrup Take 2.5-5 mLs by mouth 4 (four) times daily as needed for cough. (Patient not  taking: Reported on 12/19/2018)   No facility-administered encounter medications on file as of 12/19/2018.     Activities of Daily Living In your present state of health, do you have any difficulty performing the following activities: 12/19/2018  Hearing? N  Vision? Y  Difficulty concentrating or making decisions? N  Walking or climbing stairs? N  Dressing or bathing? N  Doing errands, shopping? N  Preparing Food and eating ? N  Using the Toilet? N  In the past six months, have you accidently leaked urine? N  Do you have problems with loss of bowel control? N  Managing your Medications? N  Managing your Finances? N  Housekeeping or managing your Housekeeping? N  Some recent data might be hidden    Patient Care Team: Hoyt Koch, MD as PCP - General (Internal Medicine)    Assessment:   This is a routine wellness examination for Patricia Elliott. Physical assessment deferred to PCP.  Exercise Activities and Dietary recommendations Current Exercise Habits: The patient does not participate in regular exercise at present(chair exercise print-outs provided), Exercise limited by: orthopedic condition(s) Diet (meal preparation, eat out, water intake, caffeinated beverages, dairy products, fruits and vegetables): in general, a "healthy" diet  , well balanced   Reviewed heart healthy and diabetic diet. Encouraged patient to increase daily water and healthy fluid intake. Discussed weight loss tips and diet. Relevant patient education assigned to patient using Emmi.  Goals    . Patient Stated     Lose weight by use portion control with food, increase water intake, investigate the right fit for me to increase physical activity. Perhaps start to ride my stationary bike again.     . Patient Stated     Lose weight by monitoring my diet closer for carbohydrate and increase my physical activity by starting to ride my stationary bike.       Fall Risk Fall Risk  12/19/2018 12/02/2017  Falls in  the past year? 0 No    Depression Screen PHQ 2/9 Scores 12/19/2018 12/02/2017  PHQ - 2 Score 0 1  PHQ- 9 Score - 1     Cognitive Function       Ad8 score reviewed for issues:  Issues making decisions: no  Less interest in hobbies / activities: no  Repeats questions, stories (family complaining): no  Trouble using ordinary gadgets (microwave, computer, phone):no  Forgets the month or year: no  Mismanaging finances: no  Remembering appts: no  Daily problems with thinking and/or memory: no Ad8 score is= 0  Immunization History  Administered Date(s) Administered  . Influenza Whole 10/25/2005  . Influenza,inj,Quad PF,6+ Mos 12/06/2017  .  Td 10/25/2006   Screening Tests Health Maintenance  Topic Date Due  . HIV Screening  11/23/1969  . FOOT EXAM  12/02/2018  . HEMOGLOBIN A1C  12/07/2018  . INFLUENZA VACCINE  01/24/2019 (Originally 05/25/2018)  . PNEUMOCOCCAL POLYSACCHARIDE VACCINE AGE 4-64 HIGH RISK  12/20/2019 (Originally 11/23/1957)  . TETANUS/TDAP  12/20/2019 (Originally 10/25/2016)  . OPHTHALMOLOGY EXAM  08/25/2019  . MAMMOGRAM  08/11/2020  . COLONOSCOPY  05/11/2027  . Hepatitis C Screening  Completed      Plan:     Reviewed health maintenance screenings with patient today and relevant education, vaccines, and/or referrals were provided.   Continue doing brain stimulating activities (puzzles, reading, adult coloring books, staying active) to keep memory sharp.   Continue to eat heart healthy diet (full of fruits, vegetables, whole grains, lean protein, water--limit salt, fat, and sugar intake) and increase physical activity as tolerated.  I have personally reviewed and noted the following in the patient's chart:   . Medical and social history . Use of alcohol, tobacco or illicit drugs  . Current medications and supplements . Functional ability and status . Nutritional status . Physical activity . Advanced directives . List of other  physicians . Vitals . Screenings to include cognitive, depression, and falls . Referrals and appointments  In addition, I have reviewed and discussed with patient certain preventive protocols, quality metrics, and best practice recommendations. A written personalized care plan for preventive services as well as general preventive health recommendations were provided to patient.     Michiel Cowboy, RN  12/19/2018

## 2018-12-19 ENCOUNTER — Ambulatory Visit (INDEPENDENT_AMBULATORY_CARE_PROVIDER_SITE_OTHER): Payer: Medicare Other | Admitting: Internal Medicine

## 2018-12-19 ENCOUNTER — Encounter: Payer: Self-pay | Admitting: Internal Medicine

## 2018-12-19 ENCOUNTER — Ambulatory Visit (INDEPENDENT_AMBULATORY_CARE_PROVIDER_SITE_OTHER): Payer: Medicare Other | Admitting: *Deleted

## 2018-12-19 ENCOUNTER — Other Ambulatory Visit (INDEPENDENT_AMBULATORY_CARE_PROVIDER_SITE_OTHER): Payer: Medicare Other

## 2018-12-19 VITALS — BP 146/84 | HR 79 | Temp 98.4°F | Ht 63.0 in | Wt 188.0 lb

## 2018-12-19 VITALS — BP 146/84 | HR 79 | Temp 98.4°F | Resp 17 | Ht 63.0 in | Wt 188.0 lb

## 2018-12-19 DIAGNOSIS — Z23 Encounter for immunization: Secondary | ICD-10-CM | POA: Diagnosis not present

## 2018-12-19 DIAGNOSIS — K219 Gastro-esophageal reflux disease without esophagitis: Secondary | ICD-10-CM

## 2018-12-19 DIAGNOSIS — E119 Type 2 diabetes mellitus without complications: Secondary | ICD-10-CM | POA: Diagnosis not present

## 2018-12-19 DIAGNOSIS — Z0000 Encounter for general adult medical examination without abnormal findings: Secondary | ICD-10-CM

## 2018-12-19 DIAGNOSIS — I10 Essential (primary) hypertension: Secondary | ICD-10-CM | POA: Diagnosis not present

## 2018-12-19 LAB — CBC
HCT: 38.7 % (ref 36.0–46.0)
Hemoglobin: 12.6 g/dL (ref 12.0–15.0)
MCHC: 32.7 g/dL (ref 30.0–36.0)
MCV: 88 fl (ref 78.0–100.0)
Platelets: 200 10*3/uL (ref 150.0–400.0)
RBC: 4.4 Mil/uL (ref 3.87–5.11)
RDW: 15.5 % (ref 11.5–15.5)
WBC: 5.6 10*3/uL (ref 4.0–10.5)

## 2018-12-19 LAB — COMPREHENSIVE METABOLIC PANEL
ALK PHOS: 73 U/L (ref 39–117)
ALT: 18 U/L (ref 0–35)
AST: 15 U/L (ref 0–37)
Albumin: 4.4 g/dL (ref 3.5–5.2)
BUN: 13 mg/dL (ref 6–23)
CO2: 27 mEq/L (ref 19–32)
Calcium: 9.6 mg/dL (ref 8.4–10.5)
Chloride: 102 mEq/L (ref 96–112)
Creatinine, Ser: 0.75 mg/dL (ref 0.40–1.20)
GFR: 94.11 mL/min (ref >60.00)
Glucose, Bld: 189 mg/dL — ABNORMAL HIGH (ref 70–99)
Potassium: 4.4 mEq/L (ref 3.5–5.1)
Sodium: 138 mEq/L (ref 135–145)
TOTAL PROTEIN: 7.9 g/dL (ref 6.0–8.3)
Total Bilirubin: 0.4 mg/dL (ref 0.2–1.2)

## 2018-12-19 LAB — LIPID PANEL
CHOLESTEROL: 201 mg/dL — AB (ref 0–200)
HDL: 48.9 mg/dL (ref >39.00)
LDL Cholesterol: 132 mg/dL — ABNORMAL HIGH (ref 0–99)
NonHDL: 152.15
Total CHOL/HDL Ratio: 4
Triglycerides: 99 mg/dL (ref 0.0–149.0)
VLDL: 19.8 mg/dL (ref 0.0–40.0)

## 2018-12-19 LAB — MICROALBUMIN / CREATININE URINE RATIO
Creatinine,U: 156.1 mg/dL
Microalb Creat Ratio: 0.7 mg/g (ref 0.0–30.0)
Microalb, Ur: 1.1 mg/dL (ref 0.0–1.9)

## 2018-12-19 LAB — HEMOGLOBIN A1C: Hgb A1c MFr Bld: 8 % — ABNORMAL HIGH (ref 4.6–6.5)

## 2018-12-19 MED ORDER — HYDROCHLOROTHIAZIDE 12.5 MG PO CAPS: 12.5000 mg | ORAL_CAPSULE | Freq: Every day | ORAL | 3 refills | Status: DC

## 2018-12-19 MED ORDER — LOSARTAN POTASSIUM 50 MG PO TABS: 50.0000 mg | ORAL_TABLET | Freq: Every day | ORAL | 3 refills | Status: DC

## 2018-12-19 NOTE — Progress Notes (Signed)
   Subjective:   Patient ID: Patricia Elliott, female    DOB: December 19, 1955, 63 y.o.   MRN: [id number]  HPI The patient is a 63 YO female coming in for follow up blood pressure (taking losartan/hctz and needs split as the pharmacy is unable to obtain combo pill, denies headaches or migraines, denies chest pains, denies side effects) and diabetes (taking jentadueto, denies new numbness or weakness, denies high sugars, weight is stable, not exercising), and GERD (taking nexium daily, this controls symptoms, does get symptoms with missing medication).   Review of Systems  Constitutional: Negative.   HENT: Negative.   Eyes: Negative.   Respiratory: Negative for cough, chest tightness and shortness of breath.   Cardiovascular: Negative for chest pain, palpitations and leg swelling.  Gastrointestinal: Negative for abdominal distention, abdominal pain, constipation, diarrhea, nausea and vomiting.  Musculoskeletal: Negative.   Skin: Negative.   Neurological: Negative.   Psychiatric/Behavioral: Negative.     Objective:  Physical Exam Constitutional:      Appearance: She is well-developed.  HENT:     Head: Normocephalic and atraumatic.  Neck:     Musculoskeletal: Normal range of motion.  Cardiovascular:     Rate and Rhythm: Normal rate and regular rhythm.  Pulmonary:     Effort: Pulmonary effort is normal. No respiratory distress.     Breath sounds: Normal breath sounds. No wheezing or rales.  Abdominal:     General: Bowel sounds are normal. There is no distension.     Palpations: Abdomen is soft.     Tenderness: There is no abdominal tenderness. There is no rebound.  Skin:    General: Skin is warm and dry.  Neurological:     Mental Status: She is alert and oriented to person, place, and time.     Coordination: Coordination normal.     Vitals:   12/19/18 0902  BP: (!) 146/84  Pulse: 79  Temp: 98.4 F (36.9 C)  TempSrc: Oral  SpO2: 98%  Weight: 188 lb (85.3 kg)  Height: 5\' 3"   (1.6 m)    Assessment & Plan:

## 2018-12-19 NOTE — Progress Notes (Signed)
Medical screening examination/treatment/procedure(s) were performed by non-physician practitioner and as supervising physician I was immediately available for consultation/collaboration. I agree with above. Paymon Rosensteel A Adely Facer, MD 

## 2018-12-19 NOTE — Patient Instructions (Addendum)
Continue doing brain stimulating activities (puzzles, reading, adult coloring books, staying active) to keep memory sharp.   Continue to eat heart healthy diet (full of fruits, vegetables, whole grains, lean protein, water--limit salt, fat, and sugar intake) and increase physical activity as tolerated.  Patricia Elliott , Thank you for taking time to come for your Medicare Wellness Visit. I appreciate your ongoing commitment to your health goals. Please review the following plan we discussed and let me know if I can assist you in the future.   These are the goals we discussed: Goals    . Patient Stated     Lose weight by use portion control with food, increase water intake, investigate the right fit for me to increase physical activity. Perhaps start to ride my stationary bike again.     . Patient Stated     Lose weight by monitoring my diet closer for carbohydrate and increase my physical activity by starting to ride my stationary bike.       This is a list of the screening recommended for you and due dates:  Health Maintenance  Topic Date Due  . HIV Screening  11/[birth year]/1969  . Complete foot exam   12/02/2018  . Hemoglobin A1C  12/07/2018  . Flu Shot  01/24/2019*  . Pneumococcal vaccine  12/20/2019*  . Tetanus Vaccine  12/20/2019*  . Eye exam for diabetics  08/25/2019  . Mammogram  08/11/2020  . Colon Cancer Screening  05/11/2027  .  Hepatitis C: One time screening is recommended by Center for Disease Control  (CDC) for  adults born from [birth year] through 1966.   Completed  *Topic was postponed. The date shown is not the original due date.     Health Maintenance, Female Adopting a healthy lifestyle and getting preventive care can go a long way to promote health and wellness. Talk with your health care provider about what schedule of regular examinations is right for you. This is a good chance for you to check in with your provider about disease prevention and staying healthy. In between  checkups, there are plenty of things you can do on your own. Experts have done a lot of research about which lifestyle changes and preventive measures are most likely to keep you healthy. Ask your health care provider for more information. Weight and diet Eat a healthy diet  Be sure to include plenty of vegetables, fruits, low-fat dairy products, and lean protein.  Do not eat a lot of foods high in solid fats, added sugars, or salt.  Get regular exercise. This is one of the most important things you can do for your health. ? Most adults should exercise for at least 150 minutes each week. The exercise should increase your heart rate and make you sweat (moderate-intensity exercise). ? Most adults should also do strengthening exercises at least twice a week. This is in addition to the moderate-intensity exercise. Maintain a healthy weight  Body mass index (BMI) is a measurement that can be used to identify possible weight problems. It estimates body fat based on height and weight. Your health care provider can help determine your BMI and help you achieve or maintain a healthy weight.  For females 16 years of age and older: ? A BMI below 18.5 is considered underweight. ? A BMI of 18.5 to 24.9 is normal. ? A BMI of 25 to 29.9 is considered overweight. ? A BMI of 30 and above is considered obese. Watch levels of cholesterol and blood lipids  You should start having your blood tested for lipids and cholesterol at 64 years of age, then have this test every 5 years.  You may need to have your cholesterol levels checked more often if: ? Your lipid or cholesterol levels are high. ? You are older than 64 years of age. ? You are at high risk for heart disease. Cancer screening Lung Cancer  Lung cancer screening is recommended for adults 71-17 years old who are at high risk for lung cancer because of a history of smoking.  A yearly low-dose CT scan of the lungs is recommended for people  who: ? Currently smoke. ? Have quit within the past 15 years. ? Have at least a 30-pack-year history of smoking. A pack year is smoking an average of one pack of cigarettes a day for 1 year.  Yearly screening should continue until it has been 15 years since you quit.  Yearly screening should stop if you develop a health problem that would prevent you from having lung cancer treatment. Breast Cancer  Practice breast self-awareness. This means understanding how your breasts normally appear and feel.  It also means doing regular breast self-exams. Let your health care provider know about any changes, no matter how small.  If you are in your 20s or 30s, you should have a clinical breast exam (CBE) by a health care provider every 1-3 years as part of a regular health exam.  If you are 62 or older, have a CBE every year. Also consider having a breast X-ray (mammogram) every year.  If you have a family history of breast cancer, talk to your health care provider about genetic screening.  If you are at high risk for breast cancer, talk to your health care provider about having an MRI and a mammogram every year.  Breast cancer gene (BRCA) assessment is recommended for women who have family members with BRCA-related cancers. BRCA-related cancers include: ? Breast. ? Ovarian. ? Tubal. ? Peritoneal cancers.  Results of the assessment will determine the need for genetic counseling and BRCA1 and BRCA2 testing. Cervical Cancer Your health care provider may recommend that you be screened regularly for cancer of the pelvic organs (ovaries, uterus, and vagina). This screening involves a pelvic examination, including checking for microscopic changes to the surface of your cervix (Pap test). You may be encouraged to have this screening done every 3 years, beginning at age 27.  For women ages 66-65, health care providers may recommend pelvic exams and Pap testing every 3 years, or they may recommend the  Pap and pelvic exam, combined with testing for human papilloma virus (HPV), every 5 years. Some types of HPV increase your risk of cervical cancer. Testing for HPV may also be done on women of any age with unclear Pap test results.  Other health care providers may not recommend any screening for nonpregnant women who are considered low risk for pelvic cancer and who do not have symptoms. Ask your health care provider if a screening pelvic exam is right for you.  If you have had past treatment for cervical cancer or a condition that could lead to cancer, you need Pap tests and screening for cancer for at least 20 years after your treatment. If Pap tests have been discontinued, your risk factors (such as having a new sexual partner) need to be reassessed to determine if screening should resume. Some women have medical problems that increase the chance of getting cervical cancer. In these cases, your health  care provider may recommend more frequent screening and Pap tests. Colorectal Cancer  This type of cancer can be detected and often prevented.  Routine colorectal cancer screening usually begins at 64 years of age and continues through 64 years of age.  Your health care provider may recommend screening at an earlier age if you have risk factors for colon cancer.  Your health care provider may also recommend using home test kits to check for hidden blood in the stool.  A small camera at the end of a tube can be used to examine your colon directly (sigmoidoscopy or colonoscopy). This is done to check for the earliest forms of colorectal cancer.  Routine screening usually begins at age 45.  Direct examination of the colon should be repeated every 5-10 years through 64 years of age. However, you may need to be screened more often if early forms of precancerous polyps or small growths are found. Skin Cancer  Check your skin from head to toe regularly.  Tell your health care provider about any new  moles or changes in moles, especially if there is a change in a mole's shape or color.  Also tell your health care provider if you have a mole that is larger than the size of a pencil eraser.  Always use sunscreen. Apply sunscreen liberally and repeatedly throughout the day.  Protect yourself by wearing long sleeves, pants, a wide-brimmed hat, and sunglasses whenever you are outside. Heart disease, diabetes, and high blood pressure  High blood pressure causes heart disease and increases the risk of stroke. High blood pressure is more likely to develop in: ? People who have blood pressure in the high end of the normal range (130-139/85-89 mm Hg). ? People who are overweight or obese. ? People who are African American.  If you are 64-14 years of age, have your blood pressure checked every 3-5 years. If you are 61 years of age or older, have your blood pressure checked every year. You should have your blood pressure measured twice-once when you are at a hospital or clinic, and once when you are not at a hospital or clinic. Record the average of the two measurements. To check your blood pressure when you are not at a hospital or clinic, you can use: ? An automated blood pressure machine at a pharmacy. ? A home blood pressure monitor.  If you are between 40 years and 38 years old, ask your health care provider if you should take aspirin to prevent strokes.  Have regular diabetes screenings. This involves taking a blood sample to check your fasting blood sugar level. ? If you are at a normal weight and have a low risk for diabetes, have this test once every three years after 64 years of age. ? If you are overweight and have a high risk for diabetes, consider being tested at a younger age or more often. Preventing infection Hepatitis B  If you have a higher risk for hepatitis B, you should be screened for this virus. You are considered at high risk for hepatitis B if: ? You were born in a country  where hepatitis B is common. Ask your health care provider which countries are considered high risk. ? Your parents were born in a high-risk country, and you have not been immunized against hepatitis B (hepatitis B vaccine). ? You have HIV or AIDS. ? You use needles to inject street drugs. ? You live with someone who has hepatitis B. ? You have had  sex with someone who has hepatitis B. ? You get hemodialysis treatment. ? You take certain medicines for conditions, including cancer, organ transplantation, and autoimmune conditions. Hepatitis C  Blood testing is recommended for: ? Everyone born from [birth year] through 1966. ? Anyone with known risk factors for hepatitis C. Sexually transmitted infections (STIs)  You should be screened for sexually transmitted infections (STIs) including gonorrhea and chlamydia if: ? You are sexually active and are younger than 64 years of age. ? You are older than 64 years of age and your health care provider tells you that you are at risk for this type of infection. ? Your sexual activity has changed since you were last screened and you are at an increased risk for chlamydia or gonorrhea. Ask your health care provider if you are at risk.  If you do not have HIV, but are at risk, it may be recommended that you take a prescription medicine daily to prevent HIV infection. This is called pre-exposure prophylaxis (PrEP). You are considered at risk if: ? You are sexually active and do not regularly use condoms or know the HIV status of your partner(s). ? You take drugs by injection. ? You are sexually active with a partner who has HIV. Talk with your health care provider about whether you are at high risk of being infected with HIV. If you choose to begin PrEP, you should first be tested for HIV. You should then be tested every 3 months for as long as you are taking PrEP. Pregnancy  If you are premenopausal and you may become pregnant, ask your health care provider  about preconception counseling.  If you may become pregnant, take 400 to 800 micrograms (mcg) of folic acid every day.  If you want to prevent pregnancy, talk to your health care provider about birth control (contraception). Osteoporosis and menopause  Osteoporosis is a disease in which the bones lose minerals and strength with aging. This can result in serious bone fractures. Your risk for osteoporosis can be identified using a bone density scan.  If you are 73 years of age or older, or if you are at risk for osteoporosis and fractures, ask your health care provider if you should be screened.  Ask your health care provider whether you should take a calcium or vitamin D supplement to lower your risk for osteoporosis.  Menopause may have certain physical symptoms and risks.  Hormone replacement therapy may reduce some of these symptoms and risks. Talk to your health care provider about whether hormone replacement therapy is right for you. Follow these instructions at home:  Schedule regular health, dental, and eye exams.  Stay current with your immunizations.  Do not use any tobacco products including cigarettes, chewing tobacco, or electronic cigarettes.  If you are pregnant, do not drink alcohol.  If you are breastfeeding, limit how much and how often you drink alcohol.  Limit alcohol intake to no more than 1 drink per day for nonpregnant women. One drink equals 12 ounces of beer, 5 ounces of Davidson Palmieri, or 1 ounces of hard liquor.  Do not use street drugs.  Do not share needles.  Ask your health care provider for help if you need support or information about quitting drugs.  Tell your health care provider if you often feel depressed.  Tell your health care provider if you have ever been abused or do not feel safe at home. This information is not intended to replace advice given to you by your health  care provider. Make sure you discuss any questions you have with your health care  provider. Document Released: 04/26/2011 Document Revised: 03/18/2016 Document Reviewed: 07/15/2015 Elsevier Interactive Patient Education  2019 Elsevier Inc. Influenza Virus Vaccine (Flucelvax) What is this medicine? INFLUENZA VIRUS VACCINE (in floo EN zuh VAHY ruhs vak SEEN) helps to reduce the risk of getting influenza also known as the flu. The vaccine only helps protect you against some strains of the flu. This medicine may be used for other purposes; ask your health care provider or pharmacist if you have questions. COMMON BRAND NAME(S): FLUCELVAX What should I tell my health care provider before I take this medicine? They need to know if you have any of these conditions: -bleeding disorder like hemophilia -fever or infection -Guillain-Barre syndrome or other neurological problems -immune system problems -infection with the human immunodeficiency virus (HIV) or AIDS -low blood platelet counts -multiple sclerosis -an unusual or allergic reaction to influenza virus vaccine, other medicines, foods, dyes or preservatives -pregnant or trying to get pregnant -breast-feeding How should I use this medicine? This vaccine is for injection into a muscle. It is given by a health care professional. A copy of Vaccine Information Statements will be given before each vaccination. Read this sheet carefully each time. The sheet may change frequently. Talk to your pediatrician regarding the use of this medicine in children. Special care may be needed. Overdosage: If you think you've taken too much of this medicine contact a poison control center or emergency room at once. Overdosage: If you think you have taken too much of this medicine contact a poison control center or emergency room at once. NOTE: This medicine is only for you. Do not share this medicine with others. What if I miss a dose? This does not apply. What may interact with this medicine? -chemotherapy or radiation therapy -medicines  that lower your immune system like etanercept, anakinra, infliximab, and adalimumab -medicines that treat or prevent blood clots like warfarin -phenytoin -steroid medicines like prednisone or cortisone -theophylline -vaccines This list may not describe all possible interactions. Give your health care provider a list of all the medicines, herbs, non-prescription drugs, or dietary supplements you use. Also tell them if you smoke, drink alcohol, or use illegal drugs. Some items may interact with your medicine. What should I watch for while using this medicine? Report any side effects that do not go away within 3 days to your doctor or health care professional. Call your health care provider if any unusual symptoms occur within 6 weeks of receiving this vaccine. You may still catch the flu, but the illness is not usually as bad. You cannot get the flu from the vaccine. The vaccine will not protect against colds or other illnesses that may cause fever. The vaccine is needed every year. What side effects may I notice from receiving this medicine? Side effects that you should report to your doctor or health care professional as soon as possible: -allergic reactions like skin rash, itching or hives, swelling of the face, lips, or tongue Side effects that usually do not require medical attention (Report these to your doctor or health care professional if they continue or are bothersome.): -fever -headache -muscle aches and pains -pain, tenderness, redness, or swelling at the injection site -tiredness This list may not describe all possible side effects. Call your doctor for medical advice about side effects. You may report side effects to FDA at 1-800-FDA-1088. Where should I keep my medicine? The vaccine will be given  by a health care professional in a clinic, pharmacy, doctor's office, or other health care setting. You will not be given vaccine doses to store at home. NOTE: This sheet is a summary. It  may not cover all possible information. If you have questions about this medicine, talk to your doctor, pharmacist, or health care provider.  2019 Elsevier/Gold Standard (2011-09-22 14:06:47)

## 2018-12-19 NOTE — Assessment & Plan Note (Signed)
Taking nexium and controlled. Does get symptoms without medications so will continue.

## 2018-12-19 NOTE — Assessment & Plan Note (Signed)
Taking losartan 50 mg and HCTZ 12.5 mg daily. Sent in 2 separate rxes due to lack of availability of combination pill. Checking CMP and adjust as needed. BP at goal.

## 2018-12-19 NOTE — Assessment & Plan Note (Signed)
Checking HgA1c, goal <8. Taking jentadueto. On ARB but not statin. Checking lipid panel. Adjust as needed. Foot exam up to date. Checking microalbumin to creatinine ratio.

## 2018-12-22 ENCOUNTER — Other Ambulatory Visit: Payer: Self-pay

## 2018-12-22 ENCOUNTER — Other Ambulatory Visit: Payer: Self-pay | Admitting: Internal Medicine

## 2018-12-22 MED ORDER — PRAVASTATIN SODIUM 20 MG PO TABS: 20.0000 mg | ORAL_TABLET | Freq: Every day | ORAL | 3 refills | Status: DC

## 2019-03-14 ENCOUNTER — Telehealth: Payer: Self-pay | Admitting: Emergency Medicine

## 2019-03-14 MED ORDER — LOSARTAN POTASSIUM 50 MG PO TABS: 50.0000 mg | ORAL_TABLET | Freq: Every day | ORAL | 1 refills | Status: DC

## 2019-03-14 MED ORDER — HYDROCHLOROTHIAZIDE 12.5 MG PO CAPS: 12.5000 mg | ORAL_CAPSULE | Freq: Every day | ORAL | 1 refills | Status: DC

## 2019-03-14 NOTE — Telephone Encounter (Signed)
Refills sent, tried contacting pt about pravastatin but couldn't not get through.

## 2019-03-14 NOTE — Telephone Encounter (Signed)
Pravastatin cannot that I am aware of to any significant degree raise sugar levels.

## 2019-03-14 NOTE — Telephone Encounter (Signed)
Copied from [place] [phone]. Topic: Quick Communication - Rx Refill/Question >> Mar 14, 2019 10:22 AM Virl Axe D wrote: Medication: losartan (COZAAR) 50 MG tablet / hydrochlorothiazide (MICROZIDE) 12.5 MG capsule /Pt normally receives rx's through [facility]. Prescription was given to take to local pharmacy. She needs to fill these through mail order due to insurance. Pt is also concerned that pravastatin (PRAVACHOL) 20 MG tablet may be causing her Blood Sugar to increase. Please advise.  Has the patient contacted their pharmacy? Yes  (Agent: If no, request that the patient contact the pharmacy for the refill.) (Agent: If yes, when and what did the pharmacy advise?)  Preferred Pharmacy (with phone number or street name): [place], [place] [phone] (Phone) [phone] (Fax)    Agent: Please be advised that RX refills may take up to 3 business days. We ask that you follow-up with your pharmacy.

## 2019-03-14 NOTE — Telephone Encounter (Signed)
Can pravastatin cause a sugar increase? Please advise and I will contact pt.

## 2019-05-22 ENCOUNTER — Other Ambulatory Visit: Payer: Self-pay | Admitting: Internal Medicine

## 2019-05-28 ENCOUNTER — Telehealth: Payer: Self-pay | Admitting: Internal Medicine

## 2019-05-28 MED ORDER — HYDROCHLOROTHIAZIDE 12.5 MG PO CAPS: 12.5000 mg | ORAL_CAPSULE | Freq: Every day | ORAL | 1 refills | Status: DC

## 2019-05-28 NOTE — Telephone Encounter (Signed)
sent 

## 2019-05-28 NOTE — Telephone Encounter (Signed)
Copied from [place] [id number]. Topic: Quick Communication - Rx Refill/Question >> May 28, 2019  3:16 PM Nils Flack, Marland Kitchen wrote: Medication: hydrochlorothiazide (MICROZIDE) 12.5 MG capsule  Has the patient contacted their pharmacy? Yes.   (Agent: If no, request that the patient contact the pharmacy for the refill.) (Agent: If yes, when and what did the pharmacy advise?) [facility]  Preferred Pharmacy (with phone number or street name):  This med is on back order and they do not know when it will be in  Please send to [facility]    Agent: Please be advised that RX refills may take up to 3 business days. We ask that you follow-up with your pharmacy.

## 2019-08-01 ENCOUNTER — Other Ambulatory Visit: Payer: Self-pay | Admitting: Internal Medicine

## 2019-08-01 DIAGNOSIS — Z1231 Encounter for screening mammogram for malignant neoplasm of breast: Secondary | ICD-10-CM

## 2019-08-23 ENCOUNTER — Other Ambulatory Visit: Payer: Self-pay | Admitting: Internal Medicine

## 2019-09-04 ENCOUNTER — Encounter: Payer: Self-pay | Admitting: Internal Medicine

## 2019-09-04 DIAGNOSIS — H3552 Pigmentary retinal dystrophy: Secondary | ICD-10-CM | POA: Diagnosis not present

## 2019-09-04 DIAGNOSIS — E119 Type 2 diabetes mellitus without complications: Secondary | ICD-10-CM | POA: Diagnosis not present

## 2019-09-04 DIAGNOSIS — H2513 Age-related nuclear cataract, bilateral: Secondary | ICD-10-CM | POA: Diagnosis not present

## 2019-09-04 LAB — HM DIABETES EYE EXAM

## 2019-09-04 NOTE — Progress Notes (Signed)
Abstracted and sent to scan  

## 2019-09-12 ENCOUNTER — Ambulatory Visit
Admission: RE | Admit: 2019-09-12 | Discharge: 2019-09-12 | Disposition: A | Discharge status: Home / Self Care | Payer: Medicare Other | Source: Ambulatory Visit | Attending: Internal Medicine | Admitting: Internal Medicine

## 2019-09-12 ENCOUNTER — Other Ambulatory Visit: Payer: Self-pay

## 2019-09-12 DIAGNOSIS — Z1231 Encounter for screening mammogram for malignant neoplasm of breast: Secondary | ICD-10-CM

## 2019-11-01 ENCOUNTER — Other Ambulatory Visit: Payer: Self-pay | Admitting: Internal Medicine

## 2019-11-18 ENCOUNTER — Other Ambulatory Visit: Payer: Self-pay | Admitting: Internal Medicine

## 2020-01-30 ENCOUNTER — Other Ambulatory Visit: Payer: Self-pay | Admitting: Internal Medicine

## 2020-02-04 ENCOUNTER — Other Ambulatory Visit: Payer: Self-pay | Admitting: Internal Medicine

## 2020-02-04 ENCOUNTER — Ambulatory Visit: Payer: Medicare Other | Attending: Internal Medicine

## 2020-02-04 DIAGNOSIS — Z23 Encounter for immunization: Secondary | ICD-10-CM

## 2020-02-04 NOTE — Progress Notes (Signed)
   Covid-19 Vaccination Clinic  Name:  Patricia Elliott    MRN: [id number] DOB: 1956/07/23  02/04/2020  Patricia Elliott was observed post Covid-19 immunization for 15 minutes without incident. She was provided with Vaccine Information Sheet and instruction to access the V-Safe system.   Patricia Elliott was instructed to call 911 with any severe reactions post vaccine: Marland Kitchen Difficulty breathing  . Swelling of face and throat  . A fast heartbeat  . A bad rash all over body  . Dizziness and weakness   Immunizations Administered    Name Date Dose VIS Date Route   Pfizer COVID-19 Vaccine 02/04/2020  1:56 PM 0.3 mL 10/05/2019 Intramuscular   Manufacturer: [employer]   Lot: [id number]   [place]: [id number]

## 2020-02-25 ENCOUNTER — Ambulatory Visit: Payer: Medicare Other | Attending: Internal Medicine

## 2020-02-25 DIAGNOSIS — Z23 Encounter for immunization: Secondary | ICD-10-CM

## 2020-02-25 NOTE — Progress Notes (Signed)
   Covid-19 Vaccination Clinic  Name:  Patricia Elliott    MRN: [id number] DOB: [birth year]  02/25/2020  [name] was observed post Covid-19 immunization for 15 minutes without incident. She was provided with Vaccine Information Sheet and instruction to access the V-Safe system.   [name] was instructed to call 911 with any severe reactions post vaccine: Marland Kitchen Difficulty breathing  . Swelling of face and throat  . A fast heartbeat  . A bad rash all over body  . Dizziness and weakness   Immunizations Administered    Name Date Dose VIS Date Route   Pfizer COVID-19 Vaccine 02/25/2020  2:54 PM 0.3 mL 12/19/2018 Intramuscular   Manufacturer: [place]   Lot: [id number]   [place]: [id number]

## 2020-03-13 ENCOUNTER — Encounter: Payer: Self-pay | Admitting: Internal Medicine

## 2020-03-13 ENCOUNTER — Ambulatory Visit (INDEPENDENT_AMBULATORY_CARE_PROVIDER_SITE_OTHER): Payer: Medicare Other | Admitting: Internal Medicine

## 2020-03-13 ENCOUNTER — Ambulatory Visit (INDEPENDENT_AMBULATORY_CARE_PROVIDER_SITE_OTHER)
Admission: RE | Admit: 2020-03-13 | Discharge: 2020-03-13 | Disposition: A | Discharge status: Home / Self Care | Payer: Medicare Other | Source: Ambulatory Visit | Attending: Internal Medicine | Admitting: Internal Medicine

## 2020-03-13 ENCOUNTER — Telehealth: Payer: Self-pay

## 2020-03-13 ENCOUNTER — Other Ambulatory Visit: Payer: Self-pay

## 2020-03-13 VITALS — BP 128/78 | HR 80 | Temp 99.2°F | Ht 63.0 in | Wt 178.0 lb

## 2020-03-13 DIAGNOSIS — M79604 Pain in right leg: Secondary | ICD-10-CM

## 2020-03-13 DIAGNOSIS — I10 Essential (primary) hypertension: Secondary | ICD-10-CM | POA: Diagnosis not present

## 2020-03-13 DIAGNOSIS — E785 Hyperlipidemia, unspecified: Secondary | ICD-10-CM | POA: Diagnosis not present

## 2020-03-13 DIAGNOSIS — E1169 Type 2 diabetes mellitus with other specified complication: Secondary | ICD-10-CM | POA: Diagnosis not present

## 2020-03-13 DIAGNOSIS — E118 Type 2 diabetes mellitus with unspecified complications: Secondary | ICD-10-CM | POA: Diagnosis not present

## 2020-03-13 DIAGNOSIS — Z0000 Encounter for general adult medical examination without abnormal findings: Secondary | ICD-10-CM

## 2020-03-13 DIAGNOSIS — M25561 Pain in right knee: Secondary | ICD-10-CM | POA: Diagnosis not present

## 2020-03-13 LAB — CBC
HCT: 38.2 % (ref 36.0–46.0)
Hemoglobin: 12.6 g/dL (ref 12.0–15.0)
MCHC: 33 g/dL (ref 30.0–36.0)
MCV: 89.2 fl (ref 78.0–100.0)
Platelets: 206 10*3/uL (ref 150.0–400.0)
RBC: 4.28 Mil/uL (ref 3.87–5.11)
RDW: 14.9 % (ref 11.5–15.5)
WBC: 5.5 10*3/uL (ref 4.0–10.5)

## 2020-03-13 LAB — LIPID PANEL
Cholesterol: 179 mg/dL (ref 0–200)
HDL: 45.5 mg/dL (ref >39.00)
LDL Cholesterol: 106 mg/dL — ABNORMAL HIGH (ref 0–99)
NonHDL: 133.15
Total CHOL/HDL Ratio: 4
Triglycerides: 138 mg/dL (ref 0.0–149.0)
VLDL: 27.6 mg/dL (ref 0.0–40.0)

## 2020-03-13 LAB — COMPREHENSIVE METABOLIC PANEL
ALT: 16 U/L (ref 0–35)
AST: 14 U/L (ref 0–37)
Albumin: 4.3 g/dL (ref 3.5–5.2)
Alkaline Phosphatase: 82 U/L (ref 39–117)
BUN: 13 mg/dL (ref 6–23)
CO2: 29 mEq/L (ref 19–32)
Calcium: 9.7 mg/dL (ref 8.4–10.5)
Chloride: 100 mEq/L (ref 96–112)
Creatinine, Ser: 0.78 mg/dL (ref 0.40–1.20)
GFR: 89.6 mL/min (ref >60.00)
Glucose, Bld: 235 mg/dL — ABNORMAL HIGH (ref 70–99)
Potassium: 3.8 mEq/L (ref 3.5–5.1)
Sodium: 134 mEq/L — ABNORMAL LOW (ref 135–145)
Total Bilirubin: 0.5 mg/dL (ref 0.2–1.2)
Total Protein: 7.8 g/dL (ref 6.0–8.3)

## 2020-03-13 LAB — MICROALBUMIN / CREATININE URINE RATIO
Creatinine,U: 128.7 mg/dL
Microalb Creat Ratio: 0.6 mg/g (ref 0.0–30.0)
Microalb, Ur: 0.8 mg/dL (ref 0.0–1.9)

## 2020-03-13 LAB — HEMOGLOBIN A1C: Hgb A1c MFr Bld: 9.3 % — ABNORMAL HIGH (ref 4.6–6.5)

## 2020-03-13 MED ORDER — PRAVASTATIN SODIUM 20 MG PO TABS: 20.0000 mg | ORAL_TABLET | Freq: Every day | ORAL | 3 refills | Status: DC

## 2020-03-13 MED ORDER — LOSARTAN POTASSIUM-HCTZ 50-12.5 MG PO TABS: 1.0000 | ORAL_TABLET | Freq: Every day | ORAL | 3 refills | Status: DC

## 2020-03-13 NOTE — Assessment & Plan Note (Signed)
BP at goal. Re-prescribed combination pill for increased compliance and ease losartan/hctz 50/12.5 mg daily.

## 2020-03-13 NOTE — Assessment & Plan Note (Signed)
She reports worsening control at home. Complicated by hyperlipidemia and hypertension. Taking ARB and statin. Adjust jentadueto as needed. Foot exam done and eye exam up to date. Checking lipid panel and HgA1c and microalbumin to creatinine ratio.

## 2020-03-13 NOTE — Patient Instructions (Addendum)
We will check an x-ray of the knee to see if we can find the cause of the pain.   We will check the labs today and have sent in the refills.   We may need to adjust the sugar medicine to help bring the sugars back down.    Health Maintenance, Female Adopting a healthy lifestyle and getting preventive care are important in promoting health and wellness. Ask your health care provider about:  The right schedule for you to have regular tests and exams.  Things you can do on your own to prevent diseases and keep yourself healthy. What should I know about diet, weight, and exercise? Eat a healthy diet   Eat a diet that includes plenty of vegetables, fruits, low-fat dairy products, and lean protein.  Do not eat a lot of foods that are high in solid fats, added sugars, or sodium. Maintain a healthy weight Body mass index (BMI) is used to identify weight problems. It estimates body fat based on height and weight. Your health care provider can help determine your BMI and help you achieve or maintain a healthy weight. Get regular exercise Get regular exercise. This is one of the most important things you can do for your health. Most adults should:  Exercise for at least 150 minutes each week. The exercise should increase your heart rate and make you sweat (moderate-intensity exercise).  Do strengthening exercises at least twice a week. This is in addition to the moderate-intensity exercise.  Spend less time sitting. Even light physical activity can be beneficial. Watch cholesterol and blood lipids Have your blood tested for lipids and cholesterol at 65 years of age, then have this test every 5 years. Have your cholesterol levels checked more often if:  Your lipid or cholesterol levels are high.  You are older than 65 years of age.  You are at high risk for heart disease. What should I know about cancer screening? Depending on your health history and family history, you may need to have  cancer screening at various ages. This may include screening for:  Breast cancer.  Cervical cancer.  Colorectal cancer.  Skin cancer.  Lung cancer. What should I know about heart disease, diabetes, and high blood pressure? Blood pressure and heart disease  High blood pressure causes heart disease and increases the risk of stroke. This is more likely to develop in people who have high blood pressure readings, are of African descent, or are overweight.  Have your blood pressure checked: ? Every 3-5 years if you are 6-8 years of age. ? Every year if you are 54 years old or older. Diabetes Have regular diabetes screenings. This checks your fasting blood sugar level. Have the screening done:  Once every three years after age 69 if you are at a normal weight and have a low risk for diabetes.  More often and at a younger age if you are overweight or have a high risk for diabetes. What should I know about preventing infection? Hepatitis B If you have a higher risk for hepatitis B, you should be screened for this virus. Talk with your health care provider to find out if you are at risk for hepatitis B infection. Hepatitis C Testing is recommended for:  Everyone born from [birth year] through 1966.  Anyone with known risk factors for hepatitis C. Sexually transmitted infections (STIs)  Get screened for STIs, including gonorrhea and chlamydia, if: ? You are sexually active and are younger than 65 years of age. ?  You are older than 65 years of age and your health care provider tells you that you are at risk for this type of infection. ? Your sexual activity has changed since you were last screened, and you are at increased risk for chlamydia or gonorrhea. Ask your health care provider if you are at risk.  Ask your health care provider about whether you are at high risk for HIV. Your health care provider may recommend a prescription medicine to help prevent HIV infection. If you choose to take  medicine to prevent HIV, you should first get tested for HIV. You should then be tested every 3 months for as long as you are taking the medicine. Pregnancy  If you are about to stop having your period (premenopausal) and you may become pregnant, seek counseling before you get pregnant.  Take 400 to 800 micrograms (mcg) of folic acid every day if you become pregnant.  Ask for birth control (contraception) if you want to prevent pregnancy. Osteoporosis and menopause Osteoporosis is a disease in which the bones lose minerals and strength with aging. This can result in bone fractures. If you are 63 years old or older, or if you are at risk for osteoporosis and fractures, ask your health care provider if you should:  Be screened for bone loss.  Take a calcium or vitamin D supplement to lower your risk of fractures.  Be given hormone replacement therapy (HRT) to treat symptoms of menopause. Follow these instructions at home: Lifestyle  Do not use any products that contain nicotine or tobacco, such as cigarettes, e-cigarettes, and chewing tobacco. If you need help quitting, ask your health care provider.  Do not use street drugs.  Do not share needles.  Ask your health care provider for help if you need support or information about quitting drugs. Alcohol use  Do not drink alcohol if: ? Your health care provider tells you not to drink. ? You are pregnant, may be pregnant, or are planning to become pregnant.  If you drink alcohol: ? Limit how much you use to 0-1 drink a day. ? Limit intake if you are breastfeeding.  Be aware of how much alcohol is in your drink. In the [place] one drink equals one 12 oz bottle of beer (355 mL), one 5 oz glass of wine (148 mL), or one 1 oz glass of hard liquor (44 mL). General instructions  Schedule regular health, dental, and eye exams.  Stay current with your vaccines.  Tell your health care provider if: ? You often feel depressed. ? You have  ever been abused or do not feel safe at home. Summary  Adopting a healthy lifestyle and getting preventive care are important in promoting health and wellness.  Follow your health care provider's instructions about healthy diet, exercising, and getting tested or screened for diseases.  Follow your health care provider's instructions on monitoring your cholesterol and blood pressure. This information is not intended to replace advice given to you by your health care provider. Make sure you discuss any questions you have with your health care provider. Document Revised: 10/04/2018 Document Reviewed: 10/04/2018 Elsevier Patient Education  2020 [employer].

## 2020-03-13 NOTE — Assessment & Plan Note (Signed)
Ordered x-ray knee. Could be exacerbated by higher sugars or lumbar radiculopathy as she has known disease. Depending on results likely refer to sports medicine for treatment.

## 2020-03-13 NOTE — Progress Notes (Signed)
Subjective:   Patient ID: Patricia Elliott, female    DOB: [birth year], 64 y.o.   MRN: [id number]  HPI Here for medicare wellness, no new complaints. Please see A/P for status and treatment of chronic medical problems.   HPI #2: Here for follow up diabetes (taking jentadueto, no recent HgA1c, on ARB and statin) and blood pressure (taking losartan and hctz, denies side effects, denies headaches or chest pains) and cholesterol (started pravastatin after last visit Feb 2020 and she never returned for follow up, denies side effects, denies chest pains or stroke symptoms) and new right leg pain (in the right knee and down the shin, with throbbing type pain, onset about 2 months ago, denies overuse or injury at that time, did try aleve for pain which helped for awhile but no longer working).   Diet: DM since diabetic Physical activity: sedentary Depression/mood screen: negative Hearing: intact to whispered voice Visual acuity: vision loss corrected partially with lens, performs annual eye exam  ADLs: capable Fall risk: none Home safety: good Cognitive evaluation: intact to orientation, naming, recall and repetition EOL planning: adv directives discussed    Office Visit from 03/13/2020 in [place] at [facility] Total Score  0        Clinical Support from 12/02/2017 in [place]  PHQ-9 Total Score  1      I have personally reviewed and have noted 1. The patient's medical and social history - reviewed today no changes 2. Their use of alcohol, tobacco or illicit drugs 3. Their current medications and supplements 4. The patient's functional ability including ADL's, fall risks, home safety risks and hearing or visual impairment. 5. Diet and physical activities 6. Evidence for depression or mood disorders 7. Care team reviewed and updated  Patient Care Team: Hoyt Koch, MD as PCP - General (Internal Medicine) Past Medical History:    Diagnosis Date  . Arthritis   . Diabetes mellitus   . GERD (gastroesophageal reflux disease)   . Hyperlipidemia   . Hypertension   . Retinitis pigmentosa    Past Surgical History:  Procedure Laterality Date  . ABDOMINAL HYSTERECTOMY    . CESAREAN SECTION     Family History  Problem Relation Age of Onset  . Hypertension Mother   . Diabetes Father   . Heart disease Father   . Hypertension Sister   . Diabetes Brother   . Hypertension Brother   . Diabetes Son   . Breast cancer Other   . Colon cancer Neg Hx   . Stomach cancer Neg Hx    Review of Systems  Constitutional: Negative.   HENT: Negative.   Eyes: Negative.   Respiratory: Negative for cough, chest tightness and shortness of breath.   Cardiovascular: Negative for chest pain, palpitations and leg swelling.  Gastrointestinal: Negative for abdominal distention, abdominal pain, constipation, diarrhea, nausea and vomiting.  Musculoskeletal: Positive for arthralgias and myalgias.  Skin: Negative.   Neurological: Negative.   Psychiatric/Behavioral: Negative.     Objective:  Physical Exam Constitutional:      Appearance: She is well-developed. She is obese.  HENT:     Head: Normocephalic and atraumatic.  Cardiovascular:     Rate and Rhythm: Normal rate and regular rhythm.  Pulmonary:     Effort: Pulmonary effort is normal. No respiratory distress.     Breath sounds: Normal breath sounds. No wheezing or rales.  Abdominal:     General: Bowel sounds are normal.  There is no distension.     Palpations: Abdomen is soft.     Tenderness: There is no abdominal tenderness. There is no rebound.  Musculoskeletal:     Cervical back: Normal range of motion.  Skin:    General: Skin is warm and dry.     Comments: Foot exam done  Neurological:     Mental Status: She is alert and oriented to person, place, and time.     Coordination: Coordination normal.     Vitals:   03/13/20 1052  BP: 128/78  Pulse: 80  Temp: 99.2 F  (37.3 C)  SpO2: 98%  Weight: 178 lb (80.7 kg)  Height: 5\' 3"  (1.6 m)   This visit occurred during the SARS-CoV-2 public health emergency.  Safety protocols were in place, including screening questions prior to the visit, additional usage of staff PPE, and extensive cleaning of exam room while observing appropriate contact time as indicated for disinfecting solutions.   Assessment & Plan:

## 2020-03-13 NOTE — Assessment & Plan Note (Signed)
Flu shot due next season. Covid-19 complete. Pneumonia due but within 4 weeks of covid-19 vaccine so deferred today. Shingrix counseled. Tetanus due declines. Colonoscopy due 2028. Mammogram due 2022, pap smear aged out and dexa due declines today. Counseled about sun safety and mole surveillance. Counseled about the dangers of distracted driving. Given 10 year screening recommendations.

## 2020-03-13 NOTE — Assessment & Plan Note (Signed)
Checking lipid panel as none done in follow up after pravastatin 20 mg daily. Adjust as needed for goal LDL<70.

## 2020-03-13 NOTE — Telephone Encounter (Signed)
New message   Please clarify old medication and new current medication   1.Medication Requested:losartan-hydrochlorothiazide (HYZAAR) 50-12.5 MG tablet  2. Pharmacy (Name, Street, City):[facility] [place], [place] - [address]  Asking for a call back

## 2020-03-14 ENCOUNTER — Other Ambulatory Visit: Payer: Self-pay | Admitting: Internal Medicine

## 2020-03-14 MED ORDER — JENTADUETO 2.5-1000 MG PO TABS: 1.0000 | ORAL_TABLET | Freq: Two times a day (BID) | ORAL | 1 refills | Status: DC

## 2020-03-14 MED ORDER — PRAVASTATIN SODIUM 40 MG PO TABS: 40.0000 mg | ORAL_TABLET | Freq: Every day | ORAL | 3 refills | Status: DC

## 2020-03-14 NOTE — Progress Notes (Signed)
Appointment scheduled with Dr Corey. 

## 2020-03-14 NOTE — Telephone Encounter (Signed)
Refill was sent to pharmacy on 03-13-2020

## 2020-03-17 ENCOUNTER — Telehealth: Payer: Self-pay

## 2020-03-17 NOTE — Telephone Encounter (Signed)
Called back discuss results

## 2020-03-17 NOTE — Telephone Encounter (Signed)
New message    Calling back for test results.  

## 2020-03-20 ENCOUNTER — Ambulatory Visit (INDEPENDENT_AMBULATORY_CARE_PROVIDER_SITE_OTHER): Payer: Medicare Other | Admitting: Family Medicine

## 2020-03-20 ENCOUNTER — Encounter: Payer: Self-pay | Admitting: Family Medicine

## 2020-03-20 ENCOUNTER — Other Ambulatory Visit: Payer: Self-pay

## 2020-03-20 ENCOUNTER — Ambulatory Visit: Payer: Self-pay

## 2020-03-20 ENCOUNTER — Ambulatory Visit (INDEPENDENT_AMBULATORY_CARE_PROVIDER_SITE_OTHER): Payer: Medicare Other

## 2020-03-20 VITALS — BP 128/78 | HR 87 | Ht 63.0 in | Wt 181.4 lb

## 2020-03-20 DIAGNOSIS — M79604 Pain in right leg: Secondary | ICD-10-CM

## 2020-03-20 DIAGNOSIS — M79661 Pain in right lower leg: Secondary | ICD-10-CM | POA: Diagnosis not present

## 2020-03-20 DIAGNOSIS — M545 Low back pain: Secondary | ICD-10-CM | POA: Diagnosis not present

## 2020-03-20 MED ORDER — PREGABALIN 75 MG PO CAPS: 75.0000 mg | ORAL_CAPSULE | Freq: Two times a day (BID) | ORAL | 3 refills | Status: DC

## 2020-03-20 NOTE — Patient Instructions (Signed)
Thank you for coming in today. I think this is a pinched nerve.  Take lyrica up to 2x daily for pain.  Get xray today.  If not improving could do MRI or nerve conduction study.  Could also prescribe prednisone but that will increase your blood sugar.  Keep me updated.    Radicular Pain Radicular pain is a type of pain that spreads from your back or neck along a spinal nerve. Spinal nerves are nerves that leave the spinal cord and go to the muscles. Radicular pain is sometimes called radiculopathy, radiculitis, or a pinched nerve. When you have this type of pain, you may also have weakness, numbness, or tingling in the area of your body that is supplied by the nerve. The pain may feel sharp and burning. Depending on which spinal nerve is affected, the pain may occur in the:  Neck area (cervical radicular pain). You may also feel pain, numbness, weakness, or tingling in the arms.  Mid-spine area (thoracic radicular pain). You would feel this pain in the back and chest. This type is rare.  Lower back area (lumbar radicular pain). You would feel this pain as low back pain. You may feel pain, numbness, weakness, or tingling in the buttocks or legs. Sciatica is a type of lumbar radicular pain that shoots down the back of the leg. Radicular pain occurs when one of the spinal nerves becomes irritated or squeezed (compressed). It is often caused by something pushing on a spinal nerve, such as one of the bones of the spine (vertebrae) or one of the round cushions between vertebrae (intervertebral disks). This can result from:  An injury.  Wear and tear or aging of a disk.  The growth of a bone spur that pushes on the nerve. Radicular pain often goes away when you follow instructions from your health care provider for relieving pain at home. Follow these instructions at home: Managing pain      If directed, put ice on the affected area: ? Put ice in a plastic bag. ? Place a towel between your  skin and the bag. ? Leave the ice on for 20 minutes, 2-3 times a day.  If directed, apply heat to the affected area as often as told by your health care provider. Use the heat source that your health care provider recommends, such as a moist heat pack or a heating pad. ? Place a towel between your skin and the heat source. ? Leave the heat on for 20-30 minutes. ? Remove the heat if your skin turns bright red. This is especially important if you are unable to feel pain, heat, or cold. You may have a greater risk of getting burned. Activity   Do not sit or rest in bed for long periods of time.  Try to stay as active as possible. Ask your health care provider what type of exercise or activity is best for you.  Avoid activities that make your pain worse, such as bending and lifting.  Do not lift anything that is heavier than 10 lb (4.5 kg), or the limit that you are told, until your health care provider says that it is safe.  Practice using proper technique when lifting items. Proper lifting technique involves bending your knees and rising up.  Do strength and range-of-motion exercises only as told by your health care provider or physical therapist. General instructions  Take over-the-counter and prescription medicines only as told by your health care provider.  Pay attention to any  changes in your symptoms.  Keep all follow-up visits as told by your health care provider. This is important. ? Your health care provider may send you to a physical therapist to help with this pain. Contact a health care provider if:  Your pain and other symptoms get worse.  Your pain medicine is not helping.  Your pain has not improved after a few weeks of home care.  You have a fever. Get help right away if:  You have severe pain, weakness, or numbness.  You have difficulty with bladder or bowel control. Summary  Radicular pain is a type of pain that spreads from your back or neck along a spinal  nerve.  When you have radicular pain, you may also have weakness, numbness, or tingling in the area of your body that is supplied by the nerve.  The pain may feel sharp or burning.  Radicular pain may be treated with ice, heat, medicines, or physical therapy. This information is not intended to replace advice given to you by your health care provider. Make sure you discuss any questions you have with your health care provider. Document Revised: 04/25/2018 Document Reviewed: 04/25/2018 Elsevier Patient Education  [employer].

## 2020-03-20 NOTE — Progress Notes (Signed)
Subjective:    CC: R knee and leg pain  I, Patricia Elliott, LAT, ATC, am serving as scribe for Dr. Lynne Leader.  HPI: Pt is a 64 y/o female presenting w/ c/o R knee and leg pain x 2 months w/ no known MOI.  She locates her pain to her R anterior lower leg .  She rates her pain as mod-severe and describes her pain as throbbing and tingling.  She had similar pain a few years ago that ultimately improved after about a month with gabapentin.  She denies any injury.  Radiating pain: yes in R anterior lower leg R knee swelling: No Aggravating factors: prolonged standing; initial weight bearing after prolonged sitting; walking Treatments tried: Aleve; Tylenol  Diagnostic testing: Knee x-ray normal  Pertinent review of Systems: No fevers or chills  Relevant historical information: Diabetes   Objective:    Vitals:   03/20/20 1557  BP: 128/78  Pulse: 87  SpO2: 97%   General: Well Developed, well nourished, and in no acute distress.   MSK: Right knee normal-appearing Normal motion. Nontender. Stable ligamentous exam. Intact strength. Negative Murray's test.  Right lower leg normal-appearing nontender normal motion normal capillary refill no edema. Pulses and sensation are intact distally.  L-spine normal-appearing nontender normal lumbar motion.  Negative slump test bilaterally. Lower extremity strength is intact distally. Reflexes equal bilateral extremities.  Lab and Radiology Results DG Knee Complete 4 Views Right  Result Date: 03/13/2020 CLINICAL DATA:  Right knee pain for 2 months, no known injury, initial encounter EXAM: RIGHT KNEE - COMPLETE 4+ VIEW COMPARISON:  None. FINDINGS: No acute fracture or dislocation is noted. No soft tissue abnormality is seen. IMPRESSION: No acute abnormality noted. Electronically Signed   By: Inez Catalina M.D.   On: 03/13/2020 16:06   I, Lynne Leader, personally (independently) visualized and performed the interpretation of the images  attached in this note.  Diagnostic Limited MSK Ultrasound of: Right knee Tendon intact.  Faint hyperechoic change at distal tendon insertion consistent with calcific tendinopathy. No significant joint effusion Patellar tendon intact with again hyperechoic change at proximal tendon insertion consistent with calcific tendinopathy Lateral joint line margin normal-appearing Medial joint line degenerative and slightly narrowed. No Baker's cyst. 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  No acute fractures  Right tib-fib normal-appearing with no lytic lesions  Await formal radiology review  Impression and Recommendations:    Assessment and Plan: 63 y.o. female with right lower leg pain ongoing for about 2 months.  Unlikely to be an isolated orthopedic problem at the lower leg.  X-ray knee and ultrasound knee largely normal-appearing is a physical exam.  Most likely to be lumbar radiculopathy at L5 or possibly L4.  Common fibular nerve entrapment is a possibility but less likely as she is not tender at the proximal fibular head.. Plan for trial of Lyrica.  Normally would prescribe prednisone the situation but her diabetes is not ideally controlled I would like to avoid hyperglycemia.  If not improving would either proceed with lumbar MRI or nerve conduction study.  PDMP not reviewed this encounter. Orders Placed This Encounter  Procedures  . [place] LIMITED JOINT SPACE STRUCTURES LOW RIGHT(NO LINKED CHARGES)    Order Specific Question:   Reason for Exam (SYMPTOM  OR DIAGNOSIS REQUIRED)    Answer:   R leg pain  Order Specific Question:   Preferred imaging location?    Answer:   [place]  . DG Lumbar Spine 2-3 Views    Standing Status:   Future    Number of Occurrences:   1    Standing  Expiration Date:   03/20/2021    Order Specific Question:   Reason for Exam (SYMPTOM  OR DIAGNOSIS REQUIRED)    Answer:   eval possible radicular pain L4 or L5    Order Specific Question:   Preferred imaging location?    Answer:   [facility]    Order Specific Question:   Radiology Contrast Protocol - do NOT remove file path    Answer:   \\[email or web address].[email or web address]  . DG Tibia/Fibula Right    Standing Status:   Future    Number of Occurrences:   1    Standing Expiration Date:   03/20/2021    Order Specific Question:   Reason for Exam (SYMPTOM  OR DIAGNOSIS REQUIRED)    Answer:   eval shin pain r    Order Specific Question:   Preferred imaging location?    Answer:   [facility]    Order Specific Question:   Radiology Contrast Protocol - do NOT remove file path    Answer:   \\[email or web address]   Meds ordered this encounter  Medications  . pregabalin (LYRICA) 75 MG capsule    Sig: Take 1 capsule (75 mg total) by mouth 2 (two) times daily.    Dispense:  60 capsule    Refill:  3    Discussed warning signs or symptoms. Please see discharge instructions. Patient expresses understanding.   The above documentation has been reviewed and is accurate and complete Lynne Leader, M.D.

## 2020-03-21 NOTE — Progress Notes (Signed)
X-ray lumbar spine does show some arthritis.

## 2020-03-21 NOTE — Progress Notes (Signed)
Right lower leg x-ray looks normal

## 2020-03-28 ENCOUNTER — Telehealth: Payer: Self-pay

## 2020-03-28 NOTE — Telephone Encounter (Signed)
I do not see any MRI scans in our electronic records from 2018 but would be happy to discuss in office. She can bring results.

## 2020-03-28 NOTE — Telephone Encounter (Signed)
Call and spoke to patient

## 2020-03-28 NOTE — Telephone Encounter (Signed)
Please advise this 

## 2020-03-28 NOTE — Telephone Encounter (Signed)
New message    Patient voiced a call back from MD or CMA to discuss MRI scan taken 2018.   Recent visit with Dr. Dorena Bodo

## 2020-03-31 ENCOUNTER — Telehealth: Payer: Self-pay | Admitting: Family Medicine

## 2020-03-31 NOTE — Telephone Encounter (Signed)
If you call this pt, please advise her on her XR results as pt has not returned call regarding her XR results.

## 2020-03-31 NOTE — Telephone Encounter (Signed)
Pt called, said during her visit with Dr. Georgina Snell in May he mentioned what looked like an ovarian cyst in a MRI done a few years ago. Pt was unaware of this result and is concerned. Pt contacted her PCP/Dr. Sharlet Salina but she was unable to find such a result. Could Dr. Georgina Snell advise patient of the date of this MRI so that she can follow up with PCP?

## 2020-04-01 ENCOUNTER — Telehealth: Payer: Self-pay | Admitting: Internal Medicine

## 2020-04-01 NOTE — Telephone Encounter (Signed)
    Patient calling to request hydrochlorothiazide be written ans sent to [facility] [phone] - [place], [place] AT [address]. She states she currently has plenty of Losartan and wants to use them before changing to the combination pill  Also she wants to know if she can take take 2 tablets in the morning and 2 at bedtime of JENTADUETO. She currently has a supply of the 2.5-500mg  tabs

## 2020-04-01 NOTE — Telephone Encounter (Signed)
Attempted to call pt back on two separate occasions. Line was busy each time.

## 2020-04-01 NOTE — Telephone Encounter (Signed)
Called and LM regarding her ovarian cyst and that it was seen in a CT scan of her abdomen in 2018.  I also left info regarding her tib/fib and L-spine XR results as she never called back to get these results.

## 2020-04-01 NOTE — Telephone Encounter (Signed)
Ct scan of abdomen in 2018 has the following.   Reproductive:22 x 27 mm cyst in the left ovary, simple appearing by CT. Per ACR guidelines no strict imaging follow-up required for cysts with this appearance that are less than 3 cm. Report from 2001 pelvic sonography also notes left-sided ovarian cysts.  This does not require followup.

## 2020-04-01 NOTE — Telephone Encounter (Signed)
Okay to take 2 pills morning and 2 pills night until running out of jantadueto. Then should switch to new dose and take 1 pill BID.

## 2020-04-02 ENCOUNTER — Telehealth: Payer: Self-pay | Admitting: Family Medicine

## 2020-04-02 NOTE — Telephone Encounter (Signed)
Patient called asking if you could call her to discuss something. She said that she did not necessarily have a question but wanted to talk to you about something.

## 2020-04-02 NOTE — Telephone Encounter (Signed)
Returned pt's call and she wanted to thanks [place] for providing the information regarding the CT scan of her abdomen that shows the ovarian cyst.

## 2020-04-04 NOTE — Telephone Encounter (Signed)
° ° °  Please return call to patient °

## 2020-04-04 NOTE — Telephone Encounter (Signed)
   Please return call to patient to discuss recommendation from Dr Sharlet Salina in previous telephone encounter

## 2020-04-04 NOTE — Telephone Encounter (Signed)
Left message for patient to call back  

## 2020-04-07 MED ORDER — LOSARTAN POTASSIUM-HCTZ 50-12.5 MG PO TABS: 1.0000 | ORAL_TABLET | Freq: Every day | ORAL | 0 refills | Status: DC

## 2020-04-07 MED ORDER — LOSARTAN POTASSIUM-HCTZ 50-12.5 MG PO TABS: 1.0000 | ORAL_TABLET | Freq: Every day | ORAL | 3 refills | Status: DC

## 2020-04-07 NOTE — Telephone Encounter (Signed)
Pt has been informed.

## 2020-04-07 NOTE — Telephone Encounter (Signed)
Hyzaar has been resent to mail order. Pt stated that [facility] never received script.

## 2020-04-07 NOTE — Addendum Note (Signed)
Addended by: Juliet Rude on: 04/07/2020 08:57 AM   Modules accepted: Orders

## 2020-05-23 ENCOUNTER — Telehealth: Payer: Self-pay | Admitting: Internal Medicine

## 2020-05-23 MED ORDER — JENTADUETO 2.5-1000 MG PO TABS: 1.0000 | ORAL_TABLET | Freq: Two times a day (BID) | ORAL | 1 refills | Status: DC

## 2020-05-23 NOTE — Telephone Encounter (Signed)
    Patient requesting refill on linaGLIPtin-metFORMIN HCl (JENTADUETO) 2.02-999 MG TABS Pharmacy:[facility] [place], [place]

## 2020-07-03 ENCOUNTER — Ambulatory Visit: Payer: Medicare Other | Admitting: Internal Medicine

## 2020-07-03 NOTE — Progress Notes (Signed)
Subjective:    Patient ID: Patricia Elliott, female    DOB: 09-18-1956, 64 y.o.   MRN: [id number]  HPI The patient is here for an acute visit.   ? Hemorrhoid:   The end of July she had a nodule in her anal region.  The day before there was a burning sensation which resolved and then the nodule popped up.  She never had pain or itching. She denies bleeding.  Her bowels are always normal.  She did have some constipation after the nodule appeared for 2 days which is unusual and she took mag citrate then her Bms have been normal.  She used topical tucks.  She states last week it went down.  She denies prior episodes.  Last colonoscopy 04/2017  Sugars have been 200 + in the monring.  This morning it was 218. She is taking all of her medications as prescribed.      Medications and allergies reviewed with patient and updated if appropriate.  Patient Active Problem List   Diagnosis Date Noted  . Right leg pain 03/13/2020  . GERD (gastroesophageal reflux disease) 12/08/2017  . Routine general medical examination at a health care facility 11/26/2016  . Obesity (BMI 30-39.9) 05/10/2014  . Diabetes mellitus type 2 with complications ([place]) 40/98/1191  . Hyperlipidemia associated with type 2 diabetes mellitus ([place]) 07/07/2007  . RETINITIS PIGMENTOSA 07/07/2007  . Essential hypertension 05/18/2007  . RAYNAUD'S SYNDROME 05/18/2007    Current Outpatient Medications on File Prior to Visit  Medication Sig Dispense Refill  . esomeprazole (NEXIUM) 20 MG capsule Take 20 mg by mouth daily at 12 noon.    Marland Kitchen glucose blood test strip Use BID to test CBG's 100 each 3  . linaGLIPtin-metFORMIN HCl (JENTADUETO) 2.02-999 MG TABS Take 1 tablet by mouth in the morning and at bedtime. 180 tablet 1  . losartan-hydrochlorothiazide (HYZAAR) 50-12.5 MG tablet Take 1 tablet by mouth daily. 90 tablet 0  . Multiple Vitamin (MULTIVITAMIN) tablet Take 1 tablet by mouth daily.    . pravastatin (PRAVACHOL) 40 MG tablet  Take 1 tablet (40 mg total) by mouth daily. 90 tablet 3  . pregabalin (LYRICA) 75 MG capsule Take 1 capsule (75 mg total) by mouth 2 (two) times daily. 60 capsule 3   No current facility-administered medications on file prior to visit.    Past Medical History:  Diagnosis Date  . Arthritis   . Diabetes mellitus   . GERD (gastroesophageal reflux disease)   . Hyperlipidemia   . Hypertension   . Retinitis pigmentosa     Past Surgical History:  Procedure Laterality Date  . ABDOMINAL HYSTERECTOMY    . CESAREAN SECTION      Social History   Socioeconomic History  . Marital status: Married    Spouse name: Not on file  . Number of children: 1  . Years of education: Not on file  . Highest education level: Not on file  Occupational History  . Not on file  Tobacco Use  . Smoking status: Never Smoker  . Smokeless tobacco: Never Used  Vaping Use  . Vaping Use: Never used  Substance and Sexual Activity  . Alcohol use: No  . Drug use: No  . Sexual activity: Yes    Partners: Male  Other Topics Concern  . Not on file  Social History Narrative  . Not on file   Social Determinants of Health   Financial Resource Strain:   . Difficulty of Paying Living Expenses:  Not on file  Food Insecurity:   . Worried About Charity fundraiser in the Last Year: Not on file  . Ran Out of Food in the Last Year: Not on file  Transportation Needs:   . Lack of Transportation (Medical): Not on file  . Lack of Transportation (Non-Medical): Not on file  Physical Activity:   . Days of Exercise per Week: Not on file  . Minutes of Exercise per Session: Not on file  Stress:   . Feeling of Stress : Not on file  Social Connections:   . Frequency of Communication with Friends and Family: Not on file  . Frequency of Social Gatherings with Friends and Family: Not on file  . Attends Religious Services: Not on file  . Active Member of Clubs or Organizations: Not on file  . Attends Archivist  Meetings: Not on file  . Marital Status: Not on file    Family History  Problem Relation Age of Onset  . Hypertension Mother   . Diabetes Father   . Heart disease Father   . Hypertension Sister   . Diabetes Brother   . Hypertension Brother   . Diabetes Son   . Breast cancer Other   . Colon cancer Neg Hx   . Stomach cancer Neg Hx     Review of Systems  Constitutional: Negative for fever.  Gastrointestinal: Negative for abdominal pain, anal bleeding, blood in stool, constipation, diarrhea, nausea and rectal pain.       Objective:   Vitals:   07/04/20 1049  BP: 132/80  Pulse: 75  Temp: 98.4 F (36.9 C)  SpO2: 95%   BP Readings from Last 3 Encounters:  07/04/20 132/80  03/20/20 128/78  03/13/20 128/78   Wt Readings from Last 3 Encounters:  07/04/20 183 lb (83 kg)  03/20/20 181 lb 6.4 oz (82.3 kg)  03/13/20 178 lb (80.7 kg)   Body mass index is 32.42 kg/m.   Physical Exam Constitutional:      General: She is not in acute distress.    Appearance: Normal appearance. She is not ill-appearing.  Genitourinary:    Comments: Anal area with a couple of skin tags and one hemorrhoid that is pea sized and non- tender Neurological:     Mental Status: She is alert.            Assessment & Plan:    See Problem List for Assessment and Plan of chronic medical problems.    This visit occurred during the SARS-CoV-2 public health emergency.  Safety protocols were in place, including screening questions prior to the visit, additional usage of staff PPE, and extensive cleaning of exam room while observing appropriate contact time as indicated for disinfecting solutions.

## 2020-07-04 ENCOUNTER — Ambulatory Visit (INDEPENDENT_AMBULATORY_CARE_PROVIDER_SITE_OTHER): Payer: Medicare Other | Admitting: Internal Medicine

## 2020-07-04 ENCOUNTER — Encounter: Payer: Self-pay | Admitting: Internal Medicine

## 2020-07-04 ENCOUNTER — Other Ambulatory Visit: Payer: Self-pay

## 2020-07-04 DIAGNOSIS — E118 Type 2 diabetes mellitus with unspecified complications: Secondary | ICD-10-CM

## 2020-07-04 DIAGNOSIS — K644 Residual hemorrhoidal skin tags: Secondary | ICD-10-CM | POA: Diagnosis not present

## 2020-07-04 MED ORDER — RYBELSUS 3 MG PO TABS: 3.0000 mg | ORAL_TABLET | Freq: Every day | ORAL | 0 refills | Status: DC

## 2020-07-04 NOTE — Assessment & Plan Note (Signed)
Acute Symptoms and exam consistent with an external hemorrhoid Last colonoscopy 3 years ago no concerning findings The hemorrhoid is asymptomatic and has improved Discussed treatment if it flares up or if she feels like she needs it now with over-the-counter Preparation H or Tucks pads, sitz bath's Can send in prescription cream and if needed For now no further evaluation or treatment necessary

## 2020-07-04 NOTE — Patient Instructions (Addendum)
Medications reviewed and updated.  Changes include :   Start Rybelsus 3 mg daily.  We can increase this to 7 mg after one month to improve your sugars further.     Your prescription(s) have been submitted to your pharmacy. Please take as directed and contact our office if you believe you are having problem(s) with the medication(s).    Hemorrhoids Hemorrhoids are swollen veins in and around the rectum or anus. There are two types of hemorrhoids:  Internal hemorrhoids. These occur in the veins that are just inside the rectum. They may poke through to the outside and become irritated and painful.  External hemorrhoids. These occur in the veins that are outside the anus and can be felt as a painful swelling or hard lump near the anus. Most hemorrhoids do not cause serious problems, and they can be managed with home treatments such as diet and lifestyle changes. If home treatments do not help the symptoms, procedures can be done to shrink or remove the hemorrhoids. What are the causes? This condition is caused by increased pressure in the anal area. This pressure may result from various things, including:  Constipation.  Straining to have a bowel movement.  Diarrhea.  Pregnancy.  Obesity.  Sitting for long periods of time.  Heavy lifting or other activity that causes you to strain.  Anal sex.  Riding a bike for a long period of time. What are the signs or symptoms? Symptoms of this condition include:  Pain.  Anal itching or irritation.  Rectal bleeding.  Leakage of stool (feces).  Anal swelling.  One or more lumps around the anus. How is this diagnosed? This condition can often be diagnosed through a visual exam. Other exams or tests may also be done, such as:  An exam that involves feeling the rectal area with a gloved hand (digital rectal exam).  An exam of the anal canal that is done using a small tube (anoscope).  A blood test, if you have lost a significant  amount of blood.  A test to look inside the colon using a flexible tube with a camera on the end (sigmoidoscopy or colonoscopy). How is this treated? This condition can usually be treated at home. However, various procedures may be done if dietary changes, lifestyle changes, and other home treatments do not help your symptoms. These procedures can help make the hemorrhoids smaller or remove them completely. Some of these procedures involve surgery, and others do not. Common procedures include:  Rubber band ligation. Rubber bands are placed at the base of the hemorrhoids to cut off their blood supply.  Sclerotherapy. Medicine is injected into the hemorrhoids to shrink them.  Infrared coagulation. A type of light energy is used to get rid of the hemorrhoids.  Hemorrhoidectomy surgery. The hemorrhoids are surgically removed, and the veins that supply them are tied off.  Stapled hemorrhoidopexy surgery. The surgeon staples the base of the hemorrhoid to the rectal wall. Follow these instructions at home: Eating and drinking   Eat foods that have a lot of fiber in them, such as whole grains, beans, nuts, fruits, and vegetables.  Ask your health care provider about taking products that have added fiber (fiber supplements).  Reduce the amount of fat in your diet. You can do this by eating low-fat dairy products, eating less red meat, and avoiding processed foods.  Drink enough fluid to keep your urine pale yellow. Managing pain and swelling   Take warm sitz baths for 20 minutes,  3-4 times a day to ease pain and discomfort. You may do this in a bathtub or using a portable sitz bath that fits over the toilet.  If directed, apply ice to the affected area. Using ice packs between sitz baths may be helpful. ? Put ice in a plastic bag. ? Place a towel between your skin and the bag. ? Leave the ice on for 20 minutes, 2-3 times a day. General instructions  Take over-the-counter and prescription  medicines only as told by your health care provider.  Use medicated creams or suppositories as told.  Get regular exercise. Ask your health care provider how much and what kind of exercise is best for you. In general, you should do moderate exercise for at least 30 minutes on most days of the week (150 minutes each week). This can include activities such as walking, biking, or yoga.  Go to the bathroom when you have the urge to have a bowel movement. Do not wait.  Avoid straining to have bowel movements.  Keep the anal area dry and clean. Use wet toilet paper or moist towelettes after a bowel movement.  Do not sit on the toilet for long periods of time. This increases blood pooling and pain.  Keep all follow-up visits as told by your health care provider. This is important. Contact a health care provider if you have:  Increasing pain and swelling that are not controlled by treatment or medicine.  Difficulty having a bowel movement, or you are unable to have a bowel movement.  Pain or inflammation outside the area of the hemorrhoids. Get help right away if you have:  Uncontrolled bleeding from your rectum. Summary  Hemorrhoids are swollen veins in and around the rectum or anus.  Most hemorrhoids can be managed with home treatments such as diet and lifestyle changes.  Taking warm sitz baths can help ease pain and discomfort.  In severe cases, procedures or surgery can be done to shrink or remove the hemorrhoids. This information is not intended to replace advice given to you by your health care provider. Make sure you discuss any questions you have with your health care provider. Document Revised: 03/09/2019 Document Reviewed: 03/02/2018 Elsevier Patient Education  [place].

## 2020-07-04 NOTE — Assessment & Plan Note (Signed)
Chronic Sugars not Taking medication appropriately-continue current medication twice daily Start Rybelsus 3 mg daily-after 1 month if she tolerates this well can increase to 7 mg daily Stressed low sugar/carbohydrate diet and exercise as tolerated

## 2020-07-18 ENCOUNTER — Telehealth: Payer: Self-pay | Admitting: Internal Medicine

## 2020-07-18 NOTE — Telephone Encounter (Signed)
    Patient calling because she [facility] from [facility] stating additional information is needed for Semaglutide (RYBELSUS) 3 MG TABS Patient states the letter did not say what exactly was needed.  Phone [phone]

## 2020-07-28 NOTE — Telephone Encounter (Signed)
Patient calling because she had received a letter from [facility] and it states that more information is needed for Semaglutide (RYBELSUS) 3 MG TABS Patient states that the letter does not explain what information is needed.   Phone: [phone]

## 2020-07-30 ENCOUNTER — Other Ambulatory Visit: Payer: Self-pay | Admitting: Internal Medicine

## 2020-07-30 DIAGNOSIS — Z1231 Encounter for screening mammogram for malignant neoplasm of breast: Secondary | ICD-10-CM

## 2020-07-30 NOTE — Telephone Encounter (Signed)
Attempted to call back. Pt's phone is giving a busy signal.

## 2020-07-31 ENCOUNTER — Telehealth: Payer: Self-pay | Admitting: Emergency Medicine

## 2020-07-31 NOTE — Telephone Encounter (Signed)
Pt called stating she needs a PA for the medication Semaglutide (RYBELSUS) 3 MG TABS. The phone number is [phone]. They are faxing a form over as well. Thanks.

## 2020-08-01 MED ORDER — RYBELSUS 3 MG PO TABS: 3.0000 mg | ORAL_TABLET | Freq: Every day | ORAL | 1 refills | Status: DC

## 2020-08-01 NOTE — Telephone Encounter (Signed)
Status:Approved;Review Type:Prior Auth;Coverage Start Date:07/02/2020;Coverage End Date:10/24/2098

## 2020-08-01 NOTE — Telephone Encounter (Signed)
Pt has been informed medication is approved.

## 2020-08-01 NOTE — Addendum Note (Signed)
Addended by: Hinda Kehr on: 08/01/2020 09:20 AM   Modules accepted: Orders

## 2020-08-01 NOTE — Telephone Encounter (Signed)
Key: [id number]

## 2020-08-11 ENCOUNTER — Telehealth: Payer: Self-pay | Admitting: Internal Medicine

## 2020-08-11 NOTE — Telephone Encounter (Signed)
Informed pt to speak with the pharmacist for a brand that was not affected by recall.

## 2020-08-11 NOTE — Telephone Encounter (Signed)
    Patient has questions about losartan-hydrochlorothiazide (HYZAAR) 50-12.5 MG tablet, possible recall

## 2020-09-03 DIAGNOSIS — E119 Type 2 diabetes mellitus without complications: Secondary | ICD-10-CM | POA: Diagnosis not present

## 2020-09-03 DIAGNOSIS — H3552 Pigmentary retinal dystrophy: Secondary | ICD-10-CM | POA: Diagnosis not present

## 2020-09-03 DIAGNOSIS — H25813 Combined forms of age-related cataract, bilateral: Secondary | ICD-10-CM | POA: Diagnosis not present

## 2020-09-04 ENCOUNTER — Other Ambulatory Visit: Payer: Self-pay

## 2020-09-04 ENCOUNTER — Encounter: Payer: Self-pay | Admitting: Internal Medicine

## 2020-09-04 ENCOUNTER — Ambulatory Visit (INDEPENDENT_AMBULATORY_CARE_PROVIDER_SITE_OTHER): Payer: Medicare Other | Admitting: Internal Medicine

## 2020-09-04 VITALS — BP 138/76 | HR 87 | Temp 98.6°F | Ht 63.0 in | Wt 176.0 lb

## 2020-09-04 DIAGNOSIS — E118 Type 2 diabetes mellitus with unspecified complications: Secondary | ICD-10-CM

## 2020-09-04 DIAGNOSIS — I10 Essential (primary) hypertension: Secondary | ICD-10-CM | POA: Diagnosis not present

## 2020-09-04 DIAGNOSIS — M79604 Pain in right leg: Secondary | ICD-10-CM | POA: Diagnosis not present

## 2020-09-04 LAB — COMPREHENSIVE METABOLIC PANEL
ALT: 18 U/L (ref 0–35)
AST: 13 U/L (ref 0–37)
Albumin: 4.3 g/dL (ref 3.5–5.2)
Alkaline Phosphatase: 75 U/L (ref 39–117)
BUN: 11 mg/dL (ref 6–23)
CO2: 29 mEq/L (ref 19–32)
Calcium: 9.6 mg/dL (ref 8.4–10.5)
Chloride: 100 mEq/L (ref 96–112)
Creatinine, Ser: 0.81 mg/dL (ref 0.40–1.20)
GFR: 75.98 mL/min (ref >60.00)
Glucose, Bld: 154 mg/dL — ABNORMAL HIGH (ref 70–99)
Potassium: 4 mEq/L (ref 3.5–5.1)
Sodium: 138 mEq/L (ref 135–145)
Total Bilirubin: 0.4 mg/dL (ref 0.2–1.2)
Total Protein: 7.9 g/dL (ref 6.0–8.3)

## 2020-09-04 LAB — HEMOGLOBIN A1C: Hgb A1c MFr Bld: 7.7 % — ABNORMAL HIGH (ref 4.6–6.5)

## 2020-09-04 NOTE — Progress Notes (Signed)
   Subjective:   Patient ID: Patricia Elliott, female    DOB: 25-Jul-1956, 64 y.o.   MRN: [id number]  HPI The patient is a 64 YO female coming in for follow up diabetes (HgA1c high at last visit about 6 months ago and recommended changes, given insurance this process was prolonged and she started taking rybelsus about 1 month ago, also taking jentadueto, denies changes to diet or exercise, pain in her low back and left leg precludes most exercise, denies low sugar symptoms) and blood pressure (taking losartan/hctz and denies headaches or chest pains, denies missing doses of medication) and low back pain (worsening gradually, she has good days and worse days, uses lyrica for this, denies new injury or overuse, denies new numbness or weakness in the leg).   Review of Systems  Constitutional: Negative.   HENT: Negative.   Eyes: Negative.   Respiratory: Negative for cough, chest tightness and shortness of breath.   Cardiovascular: Negative for chest pain, palpitations and leg swelling.  Gastrointestinal: Negative for abdominal distention, abdominal pain, constipation, diarrhea, nausea and vomiting.  Musculoskeletal: Positive for back pain.  Skin: Negative.   Neurological: Negative.   Psychiatric/Behavioral: Negative.     Objective:  Physical Exam Constitutional:      Appearance: She is well-developed.  HENT:     Head: Normocephalic and atraumatic.  Cardiovascular:     Rate and Rhythm: Normal rate and regular rhythm.  Pulmonary:     Effort: Pulmonary effort is normal. No respiratory distress.     Breath sounds: Normal breath sounds. No wheezing or rales.  Abdominal:     General: Bowel sounds are normal. There is no distension.     Palpations: Abdomen is soft.     Tenderness: There is no abdominal tenderness. There is no rebound.  Musculoskeletal:     Cervical back: Normal range of motion.  Skin:    General: Skin is warm and dry.  Neurological:     Mental Status: She is alert and  oriented to person, place, and time. Mental status is at baseline.     Coordination: Coordination normal.     Vitals:   09/04/20 0924  BP: 138/76  Pulse: 87  Temp: 98.6 F (37 C)  TempSrc: Oral  SpO2: 97%  Weight: 176 lb (79.8 kg)  Height: 5\' 3"  (1.6 m)    This visit occurred during the SARS-CoV-2 public health emergency.  Safety protocols were in place, including screening questions prior to the visit, additional usage of staff PPE, and extensive cleaning of exam room while observing appropriate contact time as indicated for disinfecting solutions.   Assessment & Plan:

## 2020-09-04 NOTE — Patient Instructions (Addendum)
We will check the labs today and let you know about the results.   You can get the pneumonia, tetanus and covid-19 booster shots at the pharmacy.

## 2020-09-05 NOTE — Assessment & Plan Note (Signed)
Taking losartan/hctz. Needs CMP given additional medications which could impact her kidneys. Adjust as needed.

## 2020-09-05 NOTE — Assessment & Plan Note (Signed)
Not controlled. Taking rybelsus for about 1 month and jentadueto. Checking HgA1c and adjust as needed. Needs follow up 3 months. Complicated by neuropathy.

## 2020-09-05 NOTE — Assessment & Plan Note (Signed)
Taking lyrica, prior x-rays with sports medicine and sees them prn.

## 2020-09-12 ENCOUNTER — Other Ambulatory Visit: Payer: Self-pay

## 2020-09-12 ENCOUNTER — Ambulatory Visit
Admission: RE | Admit: 2020-09-12 | Discharge: 2020-09-12 | Disposition: A | Discharge status: Home / Self Care | Payer: Medicare Other | Source: Ambulatory Visit | Attending: Internal Medicine | Admitting: Internal Medicine

## 2020-09-12 DIAGNOSIS — Z1231 Encounter for screening mammogram for malignant neoplasm of breast: Secondary | ICD-10-CM

## 2020-10-27 ENCOUNTER — Other Ambulatory Visit: Payer: Self-pay | Admitting: Internal Medicine

## 2020-12-02 DIAGNOSIS — H25813 Combined forms of age-related cataract, bilateral: Secondary | ICD-10-CM | POA: Diagnosis not present

## 2020-12-02 DIAGNOSIS — H3552 Pigmentary retinal dystrophy: Secondary | ICD-10-CM | POA: Diagnosis not present

## 2020-12-02 DIAGNOSIS — E119 Type 2 diabetes mellitus without complications: Secondary | ICD-10-CM | POA: Diagnosis not present

## 2020-12-05 ENCOUNTER — Ambulatory Visit (INDEPENDENT_AMBULATORY_CARE_PROVIDER_SITE_OTHER): Payer: Medicare Other | Admitting: Internal Medicine

## 2020-12-05 ENCOUNTER — Encounter: Payer: Self-pay | Admitting: Internal Medicine

## 2020-12-05 ENCOUNTER — Other Ambulatory Visit: Payer: Self-pay

## 2020-12-05 VITALS — BP 134/90 | HR 83 | Temp 98.3°F | Resp 18 | Ht 63.0 in | Wt 176.4 lb

## 2020-12-05 DIAGNOSIS — E1169 Type 2 diabetes mellitus with other specified complication: Secondary | ICD-10-CM

## 2020-12-05 DIAGNOSIS — M79604 Pain in right leg: Secondary | ICD-10-CM

## 2020-12-05 DIAGNOSIS — E785 Hyperlipidemia, unspecified: Secondary | ICD-10-CM

## 2020-12-05 DIAGNOSIS — E118 Type 2 diabetes mellitus with unspecified complications: Secondary | ICD-10-CM | POA: Diagnosis not present

## 2020-12-05 LAB — COMPREHENSIVE METABOLIC PANEL
ALT: 17 U/L (ref 0–35)
AST: 13 U/L (ref 0–37)
Albumin: 4.3 g/dL (ref 3.5–5.2)
Alkaline Phosphatase: 65 U/L (ref 39–117)
BUN: 13 mg/dL (ref 6–23)
CO2: 27 mEq/L (ref 19–32)
Calcium: 9.8 mg/dL (ref 8.4–10.5)
Chloride: 100 mEq/L (ref 96–112)
Creatinine, Ser: 0.77 mg/dL (ref 0.40–1.20)
GFR: 80.6 mL/min (ref >60.00)
Glucose, Bld: 125 mg/dL — ABNORMAL HIGH (ref 70–99)
Potassium: 3.9 mEq/L (ref 3.5–5.1)
Sodium: 138 mEq/L (ref 135–145)
Total Bilirubin: 0.4 mg/dL (ref 0.2–1.2)
Total Protein: 8.1 g/dL (ref 6.0–8.3)

## 2020-12-05 LAB — LIPID PANEL
Cholesterol: 201 mg/dL — ABNORMAL HIGH (ref 0–200)
HDL: 47.1 mg/dL (ref >39.00)
LDL Cholesterol: 133 mg/dL — ABNORMAL HIGH (ref 0–99)
NonHDL: 153.64
Total CHOL/HDL Ratio: 4
Triglycerides: 104 mg/dL (ref 0.0–149.0)
VLDL: 20.8 mg/dL (ref 0.0–40.0)

## 2020-12-05 LAB — HEMOGLOBIN A1C: Hgb A1c MFr Bld: 7 % — ABNORMAL HIGH (ref 4.6–6.5)

## 2020-12-05 MED ORDER — PREGABALIN 75 MG PO CAPS
75.0000 mg | ORAL_CAPSULE | Freq: Every day | ORAL | 3 refills | Status: DC
Start: 2020-12-05 — End: 2021-05-04

## 2020-12-05 NOTE — Patient Instructions (Addendum)
We are checking the labs today and have sent in lyrica to take in the evening to see if it helps with the pain. Let us know if it helps.   Diabetes Mellitus and Standards of [place] with and managing diabetes (diabetes mellitus) can be complicated. Your diabetes treatment may be managed by a team of health care providers, including:  A physician who specializes in diabetes (endocrinologist). You might also have visits with a nurse practitioner or physician assistant.  Nurses.  A registered dietitian.  A certified diabetes care and education specialist.  An exercise specialist.  A pharmacist.  An eye doctor.  A foot specialist (podiatrist).  A dental care provider.  A primary care provider.  A mental health care provider. How to manage your diabetes You can do many things to successfully manage your diabetes. Your health care providers will follow guidelines to help you get the best quality of care. Here are general guidelines for your diabetes management plan. Your health care providers may give you more specific instructions. Physical exams When you are diagnosed with diabetes, and each year after that, your health care provider will ask about your medical and family history. You will have a physical exam, which may include:  Measuring your height, weight, and body mass index (BMI).  Checking your blood pressure. This will be done at every routine medical visit. Your target blood pressure may vary depending on your medical conditions, your age, and other factors.  A thyroid exam.  A skin exam.  Screening for nerve damage (peripheral neuropathy). This may include checking the pulse in your legs and feet and the level of sensation in your hands and feet.  A foot exam to inspect the structure and skin of your feet, including checking for cuts, bruises, redness, blisters, sores, or other problems.  Screening for blood vessel (vascular) problems. This may include  checking the pulse in your legs and feet and checking your temperature. Blood tests Depending on your treatment plan and your personal needs, you may have the following tests:  Hemoglobin A1C (HbA1C). This test provides information about blood sugar (glucose) control over the previous 2-3 months. It is used to adjust your treatment plan, if needed. This test will be done: ? At least 2 times a year, if you are meeting your treatment goals. ? 4 times a year, if you are not meeting your treatment goals or if your goals have changed.  Lipid testing, including total cholesterol, LDL and HDL cholesterol, and triglyceride levels. ? The goal for LDL is less than 100 mg/dL (5.5 mmol/L). If you are at high risk for complications, the goal is less than 70 mg/dL (3.9 mmol/L). ? The goal for HDL is 40 mg/dL (2.2 mmol/L) or higher for men, and 50 mg/dL (2.8 mmol/L) or higher for women. An HDL cholesterol of 60 mg/dL (3.3 mmol/L) or higher gives some protection against heart disease. ? The goal for triglycerides is less than 150 mg/dL (8.3 mmol/L).  Liver function tests.  Kidney function tests.  Thyroid function tests.   Dental and eye exams  Visit your dentist two times a year.  If you have type 1 diabetes, your health care provider may recommend an eye exam within 5 years after you are diagnosed, and then once a year after your first exam. ? For children with type 1 diabetes, the health care provider may recommend an eye exam when your child is age 101 or older and has had diabetes for 3-5  years. After the first exam, your child should get an eye exam once a year.  If you have type 2 diabetes, your health care provider may recommend an eye exam as soon as you are diagnosed, and then every 1-2 years after your first exam.   Immunizations  A yearly flu (influenza) vaccine is recommended annually for everyone 6 months or older. This is especially important if you have diabetes.  The pneumonia  (pneumococcal) vaccine is recommended for everyone 2 years or older who has diabetes. If you are age 25 or older, you may get the pneumonia vaccine as a series of two separate shots.  The hepatitis B vaccine is recommended for adults shortly after being diagnosed with diabetes. Adults and children with diabetes should receive all other vaccines according to age-specific recommendations from the Centers for Disease Control and Prevention (CDC). Mental and emotional health Screening for symptoms of eating disorders, anxiety, and depression is recommended at the time of diagnosis and after as needed. If your screening shows that you have symptoms, you may need more evaluation. You may work with a mental health care provider. Follow these instructions at home: Treatment plan You will monitor your blood glucose levels and may give yourself insulin. Your treatment plan will be reviewed at every medical visit. You and your health care provider will discuss:  How you are taking your medicines, including insulin.  Any side effects you have.  Your blood glucose level target goals.  How often you monitor your blood glucose level.  Lifestyle habits, such as activity level and tobacco, alcohol, and substance use. Education Your health care provider will assess how well you are monitoring your blood glucose levels and whether you are taking your insulin and medicines correctly. He or she may refer you to:  A certified diabetes care and education specialist to manage your diabetes throughout your life, starting at diagnosis.  A registered dietitian who can create and review your personal nutrition plan.  An exercise specialist who can discuss your activity level and exercise plan. General instructions  Take over-the-counter and prescription medicines only as told by your health care provider.  Keep all follow-up visits. This is important. Where to find support There are many diabetes support  networks, including:  American Diabetes Association (ADA): diabetes.org  Defeat Diabetes Foundation: defeatdiabetes.org Where to find more information  American Diabetes Association (ADA): www.diabetes.org  Association of Diabetes Care & Education Specialists (ADCES): diabeteseducator.org  International Diabetes Federation (IDF): https://www.munoz-bell.org/ Summary  Managing diabetes (diabetes mellitus) can be complicated. Your diabetes treatment may be managed by a team of health care providers.  Your health care providers follow guidelines to help you get the best quality care.  You should have physical exams, blood tests, blood pressure monitoring, immunizations, and screening tests regularly. Stay updated on how to manage your diabetes.  Your health care providers may also give you more specific instructions based on your individual health. This information is not intended to replace advice given to you by your health care provider. Make sure you discuss any questions you have with your health care provider. Document Revised: 04/17/2020 Document Reviewed: 04/17/2020 Elsevier Patient Education  [place].

## 2020-12-05 NOTE — Assessment & Plan Note (Signed)
Not taking statin for last 3 months and wants to know if she should still be on medicine. Checking lipid panel and adjust as needed.

## 2020-12-05 NOTE — Assessment & Plan Note (Signed)
Checking HgA1c and foot exam done. Taking jentadueto and rybelsus. Adjust as needed. Reminded about eye exam.

## 2020-12-05 NOTE — Assessment & Plan Note (Signed)
Refill lyrica to take qhs as this is when her pain is present.

## 2020-12-05 NOTE — Progress Notes (Signed)
   Subjective:   Patient ID: Patricia Elliott, female    DOB: January 15, 1956, 65 y.o.   MRN: [id number]  HPI The patient is a 65 YO female coming in for follow up diabetes. She is taking rybelsus and is doing well overall. If she does not eat with it she does notice some GI effects but fine with food. Denies low sugars. Having more consistent sugars 100s fasting. Lowest 120s. Rare in the 200s. Some burning in the right foot heel area. Some pain which is chronic in the right leg. Not taking lyrica lately.   Review of Systems  Constitutional: Negative.   HENT: Negative.   Eyes: Negative.   Respiratory: Negative for cough, chest tightness and shortness of breath.   Cardiovascular: Negative for chest pain, palpitations and leg swelling.  Gastrointestinal: Negative for abdominal distention, abdominal pain, constipation, diarrhea, nausea and vomiting.  Musculoskeletal: Positive for myalgias.  Skin: Negative.   Neurological: Positive for numbness.  Psychiatric/Behavioral: Negative.     Objective:  Physical Exam Constitutional:      Appearance: She is well-developed and well-nourished.  HENT:     Head: Normocephalic and atraumatic.  Eyes:     Extraocular Movements: EOM normal.  Cardiovascular:     Rate and Rhythm: Normal rate and regular rhythm.  Pulmonary:     Effort: Pulmonary effort is normal. No respiratory distress.     Breath sounds: Normal breath sounds. No wheezing or rales.  Abdominal:     General: Bowel sounds are normal. There is no distension.     Palpations: Abdomen is soft.     Tenderness: There is no abdominal tenderness. There is no rebound.  Musculoskeletal:        General: No edema.     Cervical back: Normal range of motion.  Skin:    General: Skin is warm and dry.     Comments: Foot exam done  Neurological:     Mental Status: She is alert and oriented to person, place, and time.     Coordination: Coordination normal.  Psychiatric:        Mood and Affect: Mood and  affect normal.     Vitals:   12/05/20 1035  BP: 134/90  Pulse: 83  Resp: 18  Temp: 98.3 F (36.8 C)  TempSrc: Oral  SpO2: 99%  Weight: 176 lb 6.4 oz (80 kg)  Height: 5\' 3"  (1.6 m)    This visit occurred during the SARS-CoV-2 public health emergency.  Safety protocols were in place, including screening questions prior to the visit, additional usage of staff PPE, and extensive cleaning of exam room while observing appropriate contact time as indicated for disinfecting solutions.   Assessment & Plan:

## 2021-01-23 DIAGNOSIS — H25812 Combined forms of age-related cataract, left eye: Secondary | ICD-10-CM | POA: Diagnosis not present

## 2021-02-04 ENCOUNTER — Telehealth: Payer: Self-pay | Admitting: Internal Medicine

## 2021-02-04 NOTE — Telephone Encounter (Signed)
Patient requesting prior auth and refill for Semaglutide (RYBELSUS) 3 MG [place], [place]

## 2021-02-11 MED ORDER — RYBELSUS 3 MG PO TABS: 3.0000 mg | ORAL_TABLET | Freq: Every day | ORAL | 1 refills | Status: DC

## 2021-02-11 NOTE — Telephone Encounter (Signed)
Refill has been sent to the patient's pharmacy.  

## 2021-02-13 DIAGNOSIS — H25811 Combined forms of age-related cataract, right eye: Secondary | ICD-10-CM | POA: Diagnosis not present

## 2021-03-09 ENCOUNTER — Other Ambulatory Visit: Payer: Self-pay | Admitting: Internal Medicine

## 2021-03-11 DIAGNOSIS — Z961 Presence of intraocular lens: Secondary | ICD-10-CM | POA: Diagnosis not present

## 2021-03-11 DIAGNOSIS — H5213 Myopia, bilateral: Secondary | ICD-10-CM | POA: Diagnosis not present

## 2021-04-07 ENCOUNTER — Other Ambulatory Visit: Payer: Self-pay

## 2021-04-07 ENCOUNTER — Ambulatory Visit (INDEPENDENT_AMBULATORY_CARE_PROVIDER_SITE_OTHER): Payer: Medicare Other

## 2021-04-07 VITALS — BP 110/70 | HR 81 | Temp 98.0°F | Ht 63.0 in | Wt 177.8 lb

## 2021-04-07 DIAGNOSIS — Z0000 Encounter for general adult medical examination without abnormal findings: Secondary | ICD-10-CM

## 2021-04-07 NOTE — Patient Instructions (Signed)
Patricia Elliott , Thank you for taking time to come for your Medicare Wellness Visit. I appreciate your ongoing commitment to your health goals. Please review the following plan we discussed and let me know if I can assist you in the future.   Screening recommendations/referrals: Colonoscopy: 05/10/2017; due every 10 years Mammogram: 09/12/2020; due every 1-2 years Bone Density: never done Recommended yearly ophthalmology/optometry visit for glaucoma screening and checkup Recommended yearly dental visit for hygiene and checkup  Vaccinations: Influenza vaccine: 08/25/2020 Pneumococcal vaccine: never done Tdap vaccine: overdue Shingles vaccine: never done   Covid-19: 02/04/2020, 02/25/2020, 08/25/2020  Advanced directives: Please bring a copy of your health care power of attorney and living will to the office at your convenience.  Conditions/risks identified: Goal is to lose 20 pounds.  Continue to eat healthy, eat fresh fruit & vegetables, broil or bake meats and watch portion size.  Continue to drink water.  Next appointment: Please schedule your next Medicare Wellness Visit with your Nurse Health Advisor in 1 year by calling [phone].   Preventive Care 66 Years and Older, Female Preventive care refers to lifestyle choices and visits with your health care provider that can promote health and wellness. What does preventive care include? A yearly physical exam. This is also called an annual well check. Dental exams once or twice a year. Routine eye exams. Ask your health care provider how often you should have your eyes checked. Personal lifestyle choices, including: Daily care of your teeth and gums. Regular physical activity. Eating a healthy diet. Avoiding tobacco and drug use. Limiting alcohol use. Practicing safe sex. Taking low-dose aspirin every day. Taking vitamin and mineral supplements as recommended by your health care provider. What happens during an annual well check? The  services and screenings done by your health care provider during your annual well check will depend on your age, overall health, lifestyle risk factors, and family history of disease. Counseling  Your health care provider may ask you questions about your: Alcohol use. Tobacco use. Drug use. Emotional well-being. Home and relationship well-being. Sexual activity. Eating habits. History of falls. Memory and ability to understand (cognition). Work and work Statistician. Reproductive health. Screening  You may have the following tests or measurements: Height, weight, and BMI. Blood pressure. Lipid and cholesterol levels. These may be checked every 5 years, or more frequently if you are over 66 years old. Skin check. Lung cancer screening. You may have this screening every year starting at age 41 if you have a 30-pack-year history of smoking and currently smoke or have quit within the past 15 years. Fecal occult blood test (FOBT) of the stool. You may have this test every year starting at age 66. Flexible sigmoidoscopy or colonoscopy. You may have a sigmoidoscopy every 5 years or a colonoscopy every 10 years starting at age 66. Hepatitis C blood test. Hepatitis B blood test. Sexually transmitted disease (STD) testing. Diabetes screening. This is done by checking your blood sugar (glucose) after you have not eaten for a while (fasting). You may have this done every 1-3 years. Bone density scan. This is done to screen for osteoporosis. You may have this done starting at age 66. Mammogram. This may be done every 1-2 years. Talk to your health care provider about how often you should have regular mammograms. Talk with your health care provider about your test results, treatment options, and if necessary, the need for more tests. Vaccines  Your health care provider may recommend certain vaccines, such as:  Influenza vaccine. This is recommended every year. Tetanus, diphtheria, and acellular  pertussis (Tdap, Td) vaccine. You may need a Td booster every 10 years. Zoster vaccine. You may need this after age 21. Pneumococcal 13-valent conjugate (PCV13) vaccine. One dose is recommended after age 66. Pneumococcal polysaccharide (PPSV23) vaccine. One dose is recommended after age 66. Talk to your health care provider about which screenings and vaccines you need and how often you need them. This information is not intended to replace advice given to you by your health care provider. Make sure you discuss any questions you have with your health care provider. Document Released: 11/07/2015 Document Revised: 06/30/2016 Document Reviewed: 08/12/2015 Elsevier Interactive Patient Education  2017 [place] Prevention in the Home Falls can cause injuries. They can happen to people of all ages. There are many things you can do to make your home safe and to help prevent falls. What can I do on the outside of my home? Regularly fix the edges of walkways and driveways and fix any cracks. Remove anything that might make you trip as you walk through a door, such as a raised step or threshold. Trim any bushes or trees on the path to your home. Use bright outdoor lighting. Clear any walking paths of anything that might make someone trip, such as rocks or tools. Regularly check to see if handrails are loose or broken. Make sure that both sides of any steps have handrails. Any raised decks and porches should have guardrails on the edges. Have any leaves, snow, or ice cleared regularly. Use sand or salt on walking paths during winter. Clean up any spills in your garage right away. This includes oil or grease spills. What can I do in the bathroom? Use night lights. Install grab bars by the toilet and in the tub and shower. Do not use towel bars as grab bars. Use non-skid mats or decals in the tub or shower. If you need to sit down in the shower, use a plastic, non-slip stool. Keep the floor  dry. Clean up any water that spills on the floor as soon as it happens. Remove soap buildup in the tub or shower regularly. Attach bath mats securely with double-sided non-slip rug tape. Do not have throw rugs and other things on the floor that can make you trip. What can I do in the bedroom? Use night lights. Make sure that you have a light by your bed that is easy to reach. Do not use any sheets or blankets that are too big for your bed. They should not hang down onto the floor. Have a firm chair that has side arms. You can use this for support while you get dressed. Do not have throw rugs and other things on the floor that can make you trip. What can I do in the kitchen? Clean up any spills right away. Avoid walking on wet floors. Keep items that you use a lot in easy-to-reach places. If you need to reach something above you, use a strong step stool that has a grab bar. Keep electrical cords out of the way. Do not use floor polish or wax that makes floors slippery. If you must use wax, use non-skid floor wax. Do not have throw rugs and other things on the floor that can make you trip. What can I do with my stairs? Do not leave any items on the stairs. Make sure that there are handrails on both sides of the stairs and use them.  Fix handrails that are broken or loose. Make sure that handrails are as long as the stairways. Check any carpeting to make sure that it is firmly attached to the stairs. Fix any carpet that is loose or worn. Avoid having throw rugs at the top or bottom of the stairs. If you do have throw rugs, attach them to the floor with carpet tape. Make sure that you have a light switch at the top of the stairs and the bottom of the stairs. If you do not have them, ask someone to add them for you. What else can I do to help prevent falls? Wear shoes that: Do not have high heels. Have rubber bottoms. Are comfortable and fit you well. Are closed at the toe. Do not wear  sandals. If you use a stepladder: Make sure that it is fully opened. Do not climb a closed stepladder. Make sure that both sides of the stepladder are locked into place. Ask someone to hold it for you, if possible. Clearly mark and make sure that you can see: Any grab bars or handrails. First and last steps. Where the edge of each step is. Use tools that help you move around (mobility aids) if they are needed. These include: Canes. Walkers. Scooters. Crutches. Turn on the lights when you go into a dark area. Replace any light bulbs as soon as they burn out. Set up your furniture so you have a clear path. Avoid moving your furniture around. If any of your floors are uneven, fix them. If there are any pets around you, be aware of where they are. Review your medicines with your doctor. Some medicines can make you feel dizzy. This can increase your chance of falling. Ask your doctor what other things that you can do to help prevent falls. This information is not intended to replace advice given to you by your health care provider. Make sure you discuss any questions you have with your health care provider. Document Released: 08/07/2009 Document Revised: 03/18/2016 Document Reviewed: 11/15/2014 Elsevier Interactive Patient Education  2017 [employer].

## 2021-04-07 NOTE — Progress Notes (Signed)
Subjective:   Patricia Elliott is a 65 y.o. female who presents for Medicare Annual (Subsequent) preventive examination.  Review of Systems     Cardiac Risk Factors include: advanced age (>47men, >67 women);diabetes mellitus;dyslipidemia;family history of premature cardiovascular disease;hypertension;obesity (BMI >30kg/m2)     Objective:    Today's Vitals   04/07/21 0940  BP: 110/70  Pulse: 81  Temp: 98 F (36.7 C)  TempSrc: Temporal  SpO2: 98%  Weight: 177 lb 12.8 oz (80.6 kg)  Height: 5\' 3"  (1.6 m)  PainSc: 10-Worst pain ever   Body mass index is 31.5 kg/m.  Advanced Directives 04/07/2021 12/19/2018 12/02/2017  Does Patient Have a Medical Advance Directive? Yes Yes Yes  Type of Paramedic of [place];Living will [place];Living will [place];Living will  Does patient want to make changes to medical advance directive? No - Patient declined - -  Copy of [place] in Chart? No - copy requested No - copy requested No - copy requested    Current Medications (verified) Outpatient Encounter Medications as of 04/07/2021  Medication Sig   esomeprazole (NEXIUM) 20 MG capsule Take 20 mg by mouth daily at 12 noon.   glucose blood test strip Use BID to test CBG's   JENTADUETO 2.02-999 MG TABS TAKE 1 TABLET IN THE MORNING AND AT BEDTIME   losartan-hydrochlorothiazide (HYZAAR) 50-12.5 MG tablet Take 1 tablet by mouth daily.   Multiple Vitamin (MULTIVITAMIN) tablet Take 1 tablet by mouth daily.   Omega-3 Fatty Acids (FISH OIL) 500 MG CAPS Take 500 capsules by mouth in the morning and at bedtime.   pravastatin (PRAVACHOL) 40 MG tablet Take 1 tablet (40 mg total) by mouth daily.   pregabalin (LYRICA) 75 MG capsule Take 1 capsule (75 mg total) by mouth daily.   Semaglutide (RYBELSUS) 3 MG TABS Take 3 mg by mouth daily. Take in the morning 30 minutes before meal   No facility-administered encounter  medications on file as of 04/07/2021.    Allergies (verified) Patient has no known allergies.   History: Past Medical History:  Diagnosis Date   Arthritis    Diabetes mellitus    GERD (gastroesophageal reflux disease)    Hyperlipidemia    Hypertension    Retinitis pigmentosa    Past Surgical History:  Procedure Laterality Date   ABDOMINAL HYSTERECTOMY     CESAREAN SECTION     Family History  Problem Relation Age of Onset   Hypertension Mother    Diabetes Father    Heart disease Father    Hypertension Sister    Diabetes Brother    Hypertension Brother    Diabetes Son    Breast cancer Other    Colon cancer Neg Hx    Stomach cancer Neg Hx    Social History   Socioeconomic History   Marital status: Married    Spouse name: Not on file   Number of children: 1   Years of education: Not on file   Highest education level: Not on file  Occupational History   Not on file  Tobacco Use   Smoking status: Never   Smokeless tobacco: Never  Vaping Use   Vaping Use: Never used  Substance and Sexual Activity   Alcohol use: No   Drug use: No   Sexual activity: Yes    Partners: Male  Other Topics Concern   Not on file  Social History Narrative   Not on file  Social Determinants of Health   Financial Resource Strain: Low Risk    Difficulty of Paying Living Expenses: Not hard at all  Food Insecurity: No Food Insecurity   Worried About Charity fundraiser in the Last Year: Never true   [place] in the Last Year: Never true  Transportation Needs: No Transportation Needs   Lack of Transportation (Medical): No   Lack of Transportation (Non-Medical): No  Physical Activity: Inactive   Days of Exercise per Week: 0 days   Minutes of Exercise per Session: 0 min  Stress: No Stress Concern Present   Feeling of Stress : Not at all  Social Connections: Socially Integrated   Frequency of Communication with Friends and Family: More than three times a week   Frequency of  Social Gatherings with Friends and Family: More than three times a week   Attends Religious Services: More than 4 times per year   Active Member of [employer] or Organizations: Yes   Attends Music therapist: More than 4 times per year   Marital Status: Married    Tobacco Counseling Counseling given: Not Answered   Clinical Intake:  Pre-visit preparation completed: Yes  Pain : 0-10 Pain Score: 10-Worst pain ever Pain Type: Chronic pain Pain Location: Knee Pain Orientation: Right Pain Radiating Towards: right lower leg Pain Descriptors / Indicators: Constant, Discomfort, Other (Comment) (intense) Pain Onset: More than a month ago Pain Frequency: Constant Pain Relieving Factors: Nothing helps Effect of Pain on Daily Activities: Pain can diminish job performance, lower motivation to exercise, and prevent you from completing daily tasks. Pain produces disability and affects the quality of life.  Pain Relieving Factors: Nothing helps  BMI - recorded: 31.5 Nutritional Status: BMI > 30  Obese Nutritional Risks: None Diabetes: Yes CBG done?: No Did pt. bring in CBG monitor from home?: No  How often do you need to have someone help you when you read instructions, pamphlets, or other written materials from your doctor or pharmacy?: 1 - Never What is the last grade level you completed in school?: High School Graduate  Diabetic? Yes  Interpreter Needed?: No  Information entered by :: Lisette Abu, LPN.   Activities of Daily Living In your present state of health, do you have any difficulty performing the following activities: 04/07/2021 12/05/2020  Hearing? N N  Vision? Y N  Difficulty concentrating or making decisions? N N  Walking or climbing stairs? Y N  Dressing or bathing? N N  Doing errands, shopping? Y N  Comment Patient does not drive -  Preparing Food and eating ? N -  Using the Toilet? N -  In the past six months, have you accidently leaked urine? N -   Do you have problems with loss of bowel control? N -  Managing your Medications? N -  Managing your Finances? N -  Housekeeping or managing your Housekeeping? N -  Some recent data might be hidden    Patient Care Team: Hoyt Koch, MD as PCP - General (Internal Medicine)  Indicate any recent Medical Services you may have received from other than [facility] providers in the past year (date may be approximate).     Assessment:   This is a routine wellness examination for [place].  Hearing/Vision screen No results found.  Dietary issues and exercise activities discussed: Current Exercise Habits: The patient does not participate in regular exercise at present, Exercise limited by: None identified   Goals Addressed   None  Depression Screen PHQ 2/9 Scores 04/07/2021 03/13/2020 12/19/2018 12/02/2017  PHQ - 2 Score 0 0 0 1  PHQ- 9 Score - - - 1    Fall Risk Fall Risk  04/07/2021 03/13/2020 12/19/2018 12/02/2017  Falls in the past year? 0 1 0 No  Number falls in past yr: 0 1 - -  Injury with Fall? 0 1 - -  Risk for fall due to : No Fall Risks - - -  Follow up Falls evaluation completed - - -    FALL RISK PREVENTION PERTAINING TO THE HOME:  Any stairs in or around the home? No  If so, are there any without handrails? No  Home free of loose throw rugs in walkways, pet beds, electrical cords, etc? Yes  Adequate lighting in your home to reduce risk of falls? Yes   ASSISTIVE DEVICES UTILIZED TO PREVENT FALLS:  Life alert? No  Use of a cane, walker or w/c? No  Grab bars in the bathroom? Yes  Shower chair or bench in shower? No  Elevated toilet seat or a handicapped toilet? Yes   TIMED UP AND GO:  Was the test performed? Yes .  Length of time to ambulate 10 feet: 8 sec.   Gait steady and fast without use of assistive device  Cognitive Function:        Immunizations Immunization History  Administered Date(s) Administered   Influenza Whole 10/25/2005    Influenza,inj,Quad PF,6+ Mos 12/06/2017, 12/19/2018   Influenza-Unspecified 08/25/2020   PFIZER(Purple Top)SARS-COV-2 Vaccination 02/04/2020, 02/25/2020, 08/25/2020   Td 10/25/2006    TDAP status: Due, Education has been provided regarding the importance of this vaccine. Advised may receive this vaccine at local pharmacy or Health Dept. Aware to provide a copy of the vaccination record if obtained from local pharmacy or Health Dept. Verbalized acceptance and understanding.  Flu Vaccine status: Up to date  Pneumococcal vaccine status: Declined,  Education has been provided regarding the importance of this vaccine but patient still declined. Advised may receive this vaccine at local pharmacy or Health Dept. Aware to provide a copy of the vaccination record if obtained from local pharmacy or Health Dept. Verbalized acceptance and understanding.   Covid-19 vaccine status: Completed vaccines  Qualifies for Shingles Vaccine? Yes   Zostavax completed No   Shingrix Completed?: No.    Education has been provided regarding the importance of this vaccine. Patient has been advised to call insurance company to determine out of pocket expense if they have not yet received this vaccine. Advised may also receive vaccine at local pharmacy or Health Dept. Verbalized acceptance and understanding.  Screening Tests Health Maintenance  Topic Date Due   Zoster Vaccines- Shingrix (1 of 2) Never done   TETANUS/TDAP  10/25/2016   DEXA SCAN  Never done   PNA vac Low Risk Adult (1 of 2 - PCV13) Never done   OPHTHALMOLOGY EXAM  09/03/2020   COVID-19 Vaccine (4 - Booster for Pfizer series) 11/25/2020   INFLUENZA VACCINE  05/25/2021   HEMOGLOBIN A1C  06/04/2021   FOOT EXAM  12/05/2021   MAMMOGRAM  09/12/2022   COLONOSCOPY (Pts 45-62yrs Insurance coverage will need to be confirmed)  05/11/2027   Hepatitis C Screening  Completed   HPV VACCINES  Aged Out    Health Maintenance  Health Maintenance Due  Topic Date  Due   Zoster Vaccines- Shingrix (1 of 2) Never done   TETANUS/TDAP  10/25/2016   DEXA SCAN  Never done   PNA  vac Low Risk Adult (1 of 2 - PCV13) Never done   OPHTHALMOLOGY EXAM  09/03/2020   COVID-19 Vaccine (4 - Booster for Pfizer series) 11/25/2020    Colorectal cancer screening: Type of screening: Colonoscopy. Completed 05/10/2017. Repeat every 10 years  Mammogram status: Completed 09/12/2020. Repeat every year  Bone density status: never done  Lung Cancer Screening: (Low Dose CT Chest recommended if Age 82-80 years, 30 pack-year currently smoking OR have quit w/in 15years.) does not qualify.   Lung Cancer Screening Referral: no  Additional Screening:  Hepatitis C Screening: does qualify; Completed yes  Vision Screening: Recommended annual ophthalmology exams for early detection of glaucoma and other disorders of the eye. Is the patient up to date with their annual eye exam?  Yes  Who is the provider or what is the name of the office in which the patient attends annual eye exams? Richard Wyatt Portela, MD. If pt is not established with a provider, would they like to be referred to a provider to establish care? No .   Dental Screening: Recommended annual dental exams for proper oral hygiene  Community Resource Referral / Chronic Care Management: CRR required this visit?  No   CCM required this visit?  No      Plan:     I have personally reviewed and noted the following in the patient's chart:   Medical and social history Use of alcohol, tobacco or illicit drugs  Current medications and supplements including opioid prescriptions.  Functional ability and status Nutritional status Physical activity Advanced directives List of other physicians Hospitalizations, surgeries, and ER visits in previous 12 months Vitals Screenings to include cognitive, depression, and falls Referrals and appointments  In addition, I have reviewed and discussed with patient certain  preventive protocols, quality metrics, and best practice recommendations. A written personalized care plan for preventive services as well as general preventive health recommendations were provided to patient.     Sheral Flow, LPN   [id number]/[id number]   Nurse Notes:  Medications reviewed with patient; no opioid use noted.

## 2021-05-04 ENCOUNTER — Other Ambulatory Visit: Payer: Self-pay | Admitting: Internal Medicine

## 2021-06-03 ENCOUNTER — Ambulatory Visit (INDEPENDENT_AMBULATORY_CARE_PROVIDER_SITE_OTHER): Payer: Medicare Other | Admitting: Internal Medicine

## 2021-06-03 ENCOUNTER — Ambulatory Visit (INDEPENDENT_AMBULATORY_CARE_PROVIDER_SITE_OTHER): Payer: Medicare Other

## 2021-06-03 ENCOUNTER — Other Ambulatory Visit: Payer: Self-pay

## 2021-06-03 ENCOUNTER — Encounter: Payer: Self-pay | Admitting: Internal Medicine

## 2021-06-03 VITALS — BP 124/70 | HR 73 | Temp 98.5°F | Resp 18 | Ht 63.0 in | Wt 176.8 lb

## 2021-06-03 DIAGNOSIS — E118 Type 2 diabetes mellitus with unspecified complications: Secondary | ICD-10-CM

## 2021-06-03 DIAGNOSIS — I700 Atherosclerosis of aorta: Secondary | ICD-10-CM

## 2021-06-03 DIAGNOSIS — I10 Essential (primary) hypertension: Secondary | ICD-10-CM | POA: Diagnosis not present

## 2021-06-03 DIAGNOSIS — M79604 Pain in right leg: Secondary | ICD-10-CM

## 2021-06-03 DIAGNOSIS — M47816 Spondylosis without myelopathy or radiculopathy, lumbar region: Secondary | ICD-10-CM | POA: Diagnosis not present

## 2021-06-03 DIAGNOSIS — M545 Low back pain, unspecified: Secondary | ICD-10-CM | POA: Diagnosis not present

## 2021-06-03 LAB — POCT GLYCOSYLATED HEMOGLOBIN (HGB A1C): Hemoglobin A1C: 6.9 % — AB (ref 4.0–5.6)

## 2021-06-03 MED ORDER — METHOCARBAMOL 500 MG PO TABS: 500.0000 mg | ORAL_TABLET | Freq: Two times a day (BID) | ORAL | 0 refills | Status: DC | PRN

## 2021-06-03 NOTE — Patient Instructions (Signed)
We have sent in robaxin to try for the pain as needed up to twice a day and want you to see Dr. Georgina Snell again about the pain.

## 2021-06-03 NOTE — Progress Notes (Signed)
   Subjective:   Patient ID: Patricia Elliott, female    DOB: [birth year], 65 y.o.   MRN: [id number]  HPI The patient is a 65 YO female coming in for follow up diabetes and right leg pain. Saw sports medicine last year and never followed up with them. Pain is in the right shin area.   Review of Systems  Constitutional: Negative.   HENT: Negative.    Eyes: Negative.   Respiratory:  Negative for cough, chest tightness and shortness of breath.   Cardiovascular:  Negative for chest pain, palpitations and leg swelling.  Gastrointestinal:  Negative for abdominal distention, abdominal pain, constipation, diarrhea, nausea and vomiting.  Musculoskeletal:  Positive for arthralgias and myalgias.  Skin: Negative.   Neurological: Negative.   Psychiatric/Behavioral: Negative.     Objective:  Physical Exam Constitutional:      Appearance: She is well-developed.  HENT:     Head: Normocephalic and atraumatic.  Cardiovascular:     Rate and Rhythm: Normal rate and regular rhythm.  Pulmonary:     Effort: Pulmonary effort is normal. No respiratory distress.     Breath sounds: Normal breath sounds. No wheezing or rales.  Abdominal:     General: Bowel sounds are normal. There is no distension.     Palpations: Abdomen is soft.     Tenderness: There is no abdominal tenderness. There is no rebound.  Musculoskeletal:        General: Tenderness present.     Cervical back: Normal range of motion.  Skin:    General: Skin is warm and dry.  Neurological:     Mental Status: She is alert and oriented to person, place, and time.     Coordination: Coordination normal.    Vitals:   06/03/21 1008  BP: 124/70  Pulse: 73  Resp: 18  Temp: 98.5 F (36.9 C)  TempSrc: Oral  SpO2: 96%  Weight: 176 lb 12.8 oz (80.2 kg)  Height: '5\' 3"'$  (1.6 m)    This visit occurred during the SARS-CoV-2 public health emergency.  Safety protocols were in place, including screening questions prior to the visit, additional  usage of staff PPE, and extensive cleaning of exam room while observing appropriate contact time as indicated for disinfecting solutions.   Assessment & Plan:

## 2021-06-04 ENCOUNTER — Ambulatory Visit: Payer: Medicare Other | Admitting: Internal Medicine

## 2021-06-05 DIAGNOSIS — I700 Atherosclerosis of aorta: Secondary | ICD-10-CM | POA: Insufficient documentation

## 2021-06-05 NOTE — Assessment & Plan Note (Signed)
POC HgA1c done and 6.9. We will keep jentadueto and rybelsus 3 mg daily. She is taking ARB and statin.

## 2021-06-05 NOTE — Assessment & Plan Note (Signed)
Asked her to follow up with sports medicine. They felt it was lumbar radiculopathy and started lyrica which she does not think has helped. Rx robaxin in the meantime to see if this helps and update lumbar x-ray to see if there has been progression since last year.

## 2021-06-05 NOTE — Assessment & Plan Note (Signed)
BP at goal on her losartan/hctz 50/12.5 mg daily and we will continue that.

## 2021-06-05 NOTE — Assessment & Plan Note (Signed)
Noted on CT abdomen and pelvis 2018. She is on appropriate treatment with pravastatin 40 mg daily which we will continue.

## 2021-06-08 ENCOUNTER — Ambulatory Visit (INDEPENDENT_AMBULATORY_CARE_PROVIDER_SITE_OTHER): Payer: Medicare Other | Admitting: Family Medicine

## 2021-06-08 ENCOUNTER — Encounter: Payer: Self-pay | Admitting: Family Medicine

## 2021-06-08 ENCOUNTER — Other Ambulatory Visit: Payer: Self-pay

## 2021-06-08 VITALS — BP 120/62 | HR 89 | Ht 63.0 in | Wt 176.0 lb

## 2021-06-08 DIAGNOSIS — M5416 Radiculopathy, lumbar region: Secondary | ICD-10-CM | POA: Diagnosis not present

## 2021-06-08 DIAGNOSIS — M79604 Pain in right leg: Secondary | ICD-10-CM

## 2021-06-08 NOTE — Patient Instructions (Signed)
Thank you for coming in today.   You should hear from MRI scheduling within 1 week. If you do not hear please let me know.    Recheck following MRI.   Anticipate epidural steroid injection as a treatment for this if the MRI explains your leg pain.

## 2021-06-08 NOTE — Progress Notes (Signed)
I, Wendy Poet, LAT, ATC, am serving as scribe for Dr. Lynne Leader.  GIAVANA Elliott is a 65 y.o. female who presents to [place] [facility] today for f/u of R anterior lower leg pain thought to be most likely due to lumbar radiculopathy or common fibular nerve entrapment.  She was last seen by Dr. Georgina Snell on 03/20/20 and was prescribed Lyrica.  Today, pt reports that her R lower leg pain never improved.  She states that the Lyrica didn't help and is no longer taking it.  She locates her pain to her R anterior-lateral lower leg.  She notes that her pain is worse at night and upon standing from prolonged sitting.  Diagnostic imaging: L-spine XR- 06/03/21, 03/20/20; R tib/fib XR- 03/20/20; R knee XR- 03/13/20  Pertinent review of systems: No fevers or chills  Relevant historical information: Diabetes   Exam:  BP 120/62 (BP Location: Right Arm, Patient Position: Sitting, Cuff Size: Normal)   Pulse 89   Ht '5\' 3"'$  (1.6 m)   Wt 176 lb (79.8 kg)   SpO2 97%   BMI 31.18 kg/m  General: Well Developed, well nourished, and in no acute distress.   MSK: L-spine nontender midline normal lumbar motion.  Lower extremity strength is intact. Right lower leg nontender normal foot and ankle motion normal knee motion.    Lab and Radiology Results DG Lumbar Spine Complete  Result Date: 06/04/2021 CLINICAL DATA:  Right leg pain EXAM: LUMBAR SPINE - COMPLETE 4+ VIEW COMPARISON:  03/20/2020 FINDINGS: Five lumbar type vertebral segments. Vertebral body heights and alignment are maintained. No fracture identified. Multilevel intervertebral disc space loss with associated degenerative endplate changes. Findings are most pronounced at L3-4 and L4-5 with progressive endplate sclerosis at the L4-5 level. Lower lumbar facet arthrosis. IMPRESSION: Multilevel lumbar spondylosis, slightly progressed at L4-5. Electronically Signed   By: Davina Poke D.O.   On: 06/04/2021 10:40   I, Lynne Leader,  personally (independently) visualized and performed the interpretation of the images attached in this note.      Assessment and Plan: 65 y.o. female with right lower leg pain.  Etiology is unclear although this is a chronic issue ongoing now for over a year with multiple different conservative management trials.  Most likely explanation is L4 and L5 lumbar radiculopathy.  Plan for MRI L-spine to further characterize potential causes of pain and to dictate potential epidural steroid management treatment.  Recheck after MRI.  If MRI does not show cause of pain neck step may be physical therapy and nerve conduction study.   PDMP not reviewed this encounter. Orders Placed This Encounter  Procedures   MR Lumbar Spine Wo Contrast    Standing Status:   Future    Standing Expiration Date:   06/08/2022    Order Specific Question:   What is the patient's sedation requirement?    Answer:   Anti-anxiety    Order Specific Question:   Does the patient have a pacemaker or implanted devices?    Answer:   No    Order Specific Question:   Preferred imaging location?    Answer:   [facility] (table limit-550lbs)   No orders of the defined types were placed in this encounter.    Discussed warning signs or symptoms. Please see discharge instructions. Patient expresses understanding.   The above documentation has been reviewed and is accurate and complete Lynne Leader, M.D. Total encounter time 30 minutes including face-to-face time with the  patient and, reviewing past medical record, and charting on the date of service.   Treatment plan and options

## 2021-06-17 ENCOUNTER — Telehealth: Payer: Self-pay | Admitting: Family Medicine

## 2021-06-17 NOTE — Telephone Encounter (Signed)
Patient has scheduled her MRI for September 1st. She asked if Dr Georgina Snell would be able to send over a medication to help with anxiety.   Pharmacy: [facility]

## 2021-06-19 MED ORDER — LORAZEPAM 0.5 MG PO TABS: ORAL_TABLET | ORAL | 0 refills | Status: DC

## 2021-06-19 NOTE — Telephone Encounter (Signed)
Ativan prescribed for anxiety with MRI.  Do not drive after taking this medication.

## 2021-06-19 NOTE — Telephone Encounter (Signed)
Called pt and relayed info r.e. Ativan rx.  Pt verbalizes understanding and states that she will not be driving to/from her MRI.

## 2021-06-25 ENCOUNTER — Ambulatory Visit
Admission: RE | Admit: 2021-06-25 | Discharge: 2021-06-25 | Disposition: A | Discharge status: Home / Self Care | Payer: Medicare Other | Source: Ambulatory Visit | Attending: Family Medicine | Admitting: Family Medicine

## 2021-06-25 ENCOUNTER — Other Ambulatory Visit: Payer: Self-pay

## 2021-06-25 DIAGNOSIS — M545 Low back pain, unspecified: Secondary | ICD-10-CM | POA: Diagnosis not present

## 2021-06-25 DIAGNOSIS — M48061 Spinal stenosis, lumbar region without neurogenic claudication: Secondary | ICD-10-CM | POA: Diagnosis not present

## 2021-06-25 DIAGNOSIS — M79604 Pain in right leg: Secondary | ICD-10-CM

## 2021-06-25 DIAGNOSIS — M5416 Radiculopathy, lumbar region: Secondary | ICD-10-CM

## 2021-06-30 NOTE — Progress Notes (Signed)
MRI shows a bulging disc and spinal stenosis at L4-L5 that is probably pinching the nerve that causes the pain in your leg.  We will discuss this further at your follow-up visit on September 8.

## 2021-07-01 NOTE — Progress Notes (Signed)
I, Wendy Poet, LAT, ATC, am serving as scribe for Dr. Lynne Leader.  Patricia Elliott is a 65 y.o. female who presents to [place] at [facility] today for f/u of R anterior lower leg pain thought to be most likely due to lumbar radiculopathy or common fibular nerve entrapment and to review her L-spine MRI results.  She was last seen by Dr. Georgina Snell on 06/08/21 w/ no change noted in her symptoms and was referred for a lumbar MRI.  Today, pt reports no change in her R ant lower leg pain.  She also reports a pinching-type pain in her lower back.  Diagnostic imaging: L-spine MRI- 06/25/21; L-soine XR- 06/03/21  Pertinent review of systems: No fevers or chills  Relevant historical information: Diabetes   Exam:  BP 120/74 (BP Location: Right Arm, Patient Position: Sitting, Cuff Size: Normal)   Pulse 79   Ht '5\' 3"'$  (1.6 m)   Wt 178 lb 6.4 oz (80.9 kg)   SpO2 96%   BMI 31.60 kg/m  General: Well Developed, well nourished, and in no acute distress.   MSK: L-spine nontender midline decreased lumbar motion.  Lower extremity strength is intact.    Lab and Radiology Results EXAM: MRI LUMBAR SPINE WITHOUT CONTRAST   TECHNIQUE: Multiplanar, multisequence MR imaging of the lumbar spine was performed. No intravenous contrast was administered.   COMPARISON:  04/09/2012   FINDINGS: Segmentation:  Standard.   Alignment:  Physiologic.   Vertebrae: No acute fracture, evidence of discitis, or aggressive bone lesion.   Conus medullaris and cauda equina: Conus extends to the T12-L1 level. Conus and cauda equina appear normal.   Paraspinal and other soft tissues: No acute paraspinal abnormality.   Disc levels:   Disc spaces: Degenerative disease with severe disc height loss at L4-5 with reactive endplate edema. Degenerative disease with disc height loss at L3-4.   T12-L1: No significant disc bulge. No neural foraminal stenosis. No central canal stenosis.   L1-L2: No  significant disc bulge. No neural foraminal stenosis. No central canal stenosis.   L2-L3: Broad-based disc bulge effacing the ventral thecal sac. Mild bilateral facet arthropathy. Moderate spinal stenosis. Bilateral subarticular recess stenosis. Mild left foraminal stenosis. No right foraminal stenosis.   L3-L4: Broad-based disc bulge flattening the ventral thecal sac. Bilateral subarticular recess stenosis. Mild left and moderate right foraminal stenosis. Moderate spinal stenosis. Mild bilateral facet arthropathy.   L4-L5: Broad-based circumferential disc bulge. Moderate bilateral facet arthropathy with ligamentum flavum infolding. Severe spinal stenosis. Severe bilateral foraminal stenosis.   L5-S1: Minimal broad-based disc bulge. Moderate bilateral facet arthropathy. No foraminal or central canal stenosis.   IMPRESSION: 1. 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 2. At L4-5 there is a broad-based circumferential disc bulge. Moderate bilateral facet arthropathy with ligamentum flavum infolding. Severe spinal stenosis. Severe bilateral foraminal stenosis.     Electronically Signed   By: Kathreen Devoid M.D.   On: 06/28/2021 09:05 I, Lynne Leader, personally (independently) visualized and performed the interpretation of the images attached in this note.      Assessment and Plan: 65 y.o. female with right leg pain thought to be due to L4 or L5 lumbar radiculopathy which certainly is a possibility based on her spinal stenosis and neuroforaminal stenosis at L4-L5 seen on recent MRI as above.  Reviewed MRI findings with patient discussed treatment plan and options.  We will proceed to epidural steroid injection.  Patient will keep me updated with how she feels after the injection.   PDMP not reviewed this encounter. Orders Placed This Encounter  Procedures   DG INJECT DIAG/THERA/INC  NEEDLE/CATH/PLC EPI/LUMB/SAC W/IMG    LUMB EPI #1 MCR/TRICARE 174 LBS PACS (06/25/21) NO THINS/OTC *SCREENED*     Standing Status:   Future    Standing Expiration Date:   07/02/2022    Order Specific Question:   Reason for Exam (SYMPTOM  OR DIAGNOSIS REQUIRED)    Answer:   Rt L4 or L5 radiculopathy. Level and technique per radiology    Order Specific Question:   Preferred Imaging Location?    Answer:   [id number]    Order Specific Question:   Radiology Contrast Protocol - do NOT remove file path    Answer:   [email or web address]   No orders of the defined types were placed in this encounter.    Discussed warning signs or symptoms. Please see discharge instructions. Patient expresses understanding.  The above documentation has been reviewed and is accurate and complete Lynne Leader, M.D.  Total encounter time 20 minutes including face-to-face time with the patient and, reviewing past medical record, and charting on the date of service.   Treatment plan and options and MRI review

## 2021-07-02 ENCOUNTER — Encounter: Payer: Self-pay | Admitting: Family Medicine

## 2021-07-02 ENCOUNTER — Other Ambulatory Visit: Payer: Self-pay

## 2021-07-02 ENCOUNTER — Ambulatory Visit (INDEPENDENT_AMBULATORY_CARE_PROVIDER_SITE_OTHER): Payer: Medicare Other | Admitting: Family Medicine

## 2021-07-02 VITALS — BP 120/74 | HR 79 | Ht 63.0 in | Wt 178.4 lb

## 2021-07-02 DIAGNOSIS — M48062 Spinal stenosis, lumbar region with neurogenic claudication: Secondary | ICD-10-CM

## 2021-07-02 DIAGNOSIS — M5416 Radiculopathy, lumbar region: Secondary | ICD-10-CM

## 2021-07-02 NOTE — Patient Instructions (Addendum)
Thank you for coming in today.   Please call [facility] at [phone] to schedule your spine injection.     Spinal Stenosis Spinal stenosis is a condition that happens when the spinal canal narrows. The spinal canal is the space between the bones of your spine (vertebrae). This narrowing puts pressure on the spinal cord or nerves. Spinal stenosis can affect the vertebrae in the neck, upper back, and lower back. This condition can range from mild to severe. In some cases, there are no symptoms. What are the causes? This condition is caused by areas of bone pushing into the spinal canal. This condition may be present at birth (congenital), or it may be caused by: Slow breakdown of your vertebrae (spinal degeneration). This usually starts around 66 years of age. Injury (trauma) to your spine. Tumors in your spine. Calcium deposits in your spine. What increases the risk? The following factors may make you more likely to develop this condition: Being older than age 63. Having a problem present at birth with an abnormally shaped spine (congenitalspinal deformity), such as scoliosis. Having arthritis. What are the signs or symptoms? Symptoms of this condition include: Pain in the neck or back that is generally worse with activities, particularly when you stand or walk. Numbness, tingling, hot or cold sensations, weakness, or tiredness (fatigue) in your leg or legs. Pain going from the buttock, down the thigh, and to the calf (sciatica). This can happen in one or both legs. Frequent episodes of falling. A foot-slapping gait that leads to muscle weakness. In more severe cases, you may develop: Problems having a bowel movement or urinating. Difficulty having sex. Loss of feeling in your legs and inability to walk. Symptoms may come on slowly and get worse over time. In some cases, there are no symptoms. How is this diagnosed? This condition is diagnosed based on your medical history  and a physical exam. You will also have tests, such as an MRI, a CT scan, or an X-ray. How is this treated? Treatment for this condition often focuses on managing your pain and any other symptoms. Treatment may include: Practicing good posture to lessen pressure on your nerves. Exercising to strengthen muscles, build endurance, improve balance, and maintain range of motion. This may include physical therapy to restore movement and strength to your back. Losing weight, if needed. Medicines to reduce inflammation or pain. This may include a medicine that is injected into your spine (steroidinjection). Assistive devices, such as a corset or brace. In some cases, surgery may be needed. The most common procedure is decompression laminectomy. This is done to remove excess bone that puts pressure on your nerve roots. Follow these instructions at home: Managing pain, stiffness, and swelling  Practice good posture. If you were given a brace or a corset, wear it as told by your health care provider. Maintain a healthy weight. Talk with your health care provider if you need help losing weight. If directed, apply heat to the affected area as often as told by your health care provider. Use the heat source that your health care provider recommends, such as a moist heat pack or a heating pad. Place a towel between your skin and the heat source. Leave the heat on for 20-30 minutes. Remove the heat if your skin turns bright red. This is especially important if you are unable to feel pain, heat, or cold. You may have a greater risk of getting burned. Activity Do all exercises and stretches as told by your  health care provider. Do not do any activities that cause pain. Ask your health care provider what activities are safe for you. Do not lift anything that is heavier than 10 lb (4.5 kg), or the limit that you are told by your health care provider. Return to your normal activities as told by your health care  provider. Ask your health care provider what activities are safe for you. General instructions Take over-the-counter and prescription medicines only as told by your health care provider. Do not use any products that contain nicotine or tobacco, such as cigarettes, e-cigarettes, and chewing tobacco. If you need help quitting, ask your health care provider. Eat a healthy diet. This includes plenty of fruits and vegetables, whole grains, and low-fat (lean) protein. Keep all follow-up visits as told by your health care provider. This is important. Contact a health care provider if: Your symptoms do not get better or they get worse. You have a fever. Get help right away if: You have new pain or symptoms of severe pain, such as: New or worsening pain in your neck or upper back. Severe pain that cannot be controlled with medicines. A severe headache that gets worse when you stand. You are dizzy. You have vision problems, such as blurred vision or double vision. You have nausea or you vomit. You develop new or worsening numbness or tingling in your back or legs. You have pain, redness, swelling, or warmth in your arm or leg. Summary Spinal stenosis is a condition that happens when the spinal canal narrows. The spinal canal is the space between the bones of your spine (vertebrae). This narrowing puts pressure on the spinal cord or nerves. This condition may be caused by a birth defect, breakdown of your vertebrae, trauma, tumors, or calcium deposits. Spinal stenosis can cause numbness, weakness, or pain in the buttocks, neck, back, and legs. This condition is usually diagnosed with your medical history, a physical exam, and tests, such as an MRI, a CT scan, or an X-ray. This information is not intended to replace advice given to you by your health care provider. Make sure you discuss any questions you have with your health care provider. Document Revised: 08/09/2019 Document Reviewed:  08/09/2019 Elsevier Patient Education  2022 [place].   Epidural Steroid Injection An epidural steroid injection is a shot of steroid medicine and numbing medicine that is given into the space between the spinal cord and the bones of the back (epidural space). The shot helps relieve pain caused by an irritated or swollen nerve root. The amount of pain relief you get from the injection depends on what is causing the nerve to be swollen and irritated, and how long your pain lasts. You are more likely to benefit from this injection if your pain is strong and comes on suddenly rather than if you have had long-term (chronic) pain. Tell a health care provider about: Any allergies you have. All medicines you are taking, including vitamins, herbs, eye drops, creams, and over-the-counter medicines. Any problems you or family members have had with anesthetic medicines. Any blood disorders you have. Any surgeries you have had. Any medical conditions you have. Whether you are pregnant or may be pregnant. What are the risks? Generally, this is a safe procedure. However, problems may occur, including: Headache. Bleeding. Infection. Allergic reaction to medicines. Nerve damage. What happens before the procedure? Staying hydrated Follow instructions from your health care provider about hydration, which may include: Up to 2 hours before the procedure -  you may continue to drink clear liquids, such as water, clear fruit juice, black coffee, and plain tea. Eating and drinking restrictions Follow instructions from your health care provider about eating and drinking, which may include: 8 hours before the procedure - stop eating heavy meals or foods, such as meat, fried foods, or fatty foods. 6 hours before the procedure - stop eating light meals or foods, such as toast or cereal. 6 hours before the procedure - stop drinking milk or drinks that contain milk. 2 hours before the procedure - stop drinking  clear liquids. Medicines You may be given medicines to lower anxiety. Ask your health care provider about: Changing or stopping your regular medicines. This is especially important if you are taking diabetes medicines or blood thinners. Taking medicines such as aspirin and ibuprofen. These medicines can thin your blood. Do not take these medicines unless your health care provider tells you to take them. Taking over-the-counter medicines, vitamins, herbs, and supplements. General instructions Ask your health care provider what steps will be taken to prevent infection. Plan to have a responsible adult take you home from the hospital or clinic. If you will be going home right after the procedure, plan to have a responsible adult care for you for the time you are told. This is important. What happens during the procedure? An IV will be inserted into one of your veins. You will be given one or more of the following: A medicine to help you relax (sedative). A medicine to numb the area (local anesthetic). You will be asked to lie on your abdomen or sit. The injection site will be cleaned. A needle will be inserted through your skin into the epidural space. This may cause you some discomfort. An X-ray machine will be used to guide the needle as close as possible to the affected nerve. A steroid medicine and a local anesthetic will be injected into the epidural space. The needle and IV will be removed. A bandage (dressing) will be put over the injection site. The procedure may vary among health care providers and hospitals. What can I expect after the procedure? Your blood pressure, heart rate, breathing rate, and blood oxygen level will be monitored until you leave the hospital or clinic. Your arm or leg may feel weak or numb for a few hours. The injection site may feel sore. Follow these instructions at home: Injection site care You may remove the bandage (dressing) after 24 hours. Check your  injection site every day for signs of infection. Check for: Redness, swelling, or pain. Fluid or blood. Warmth. Pus or a bad smell. Managing pain, stiffness, and swelling For 24 hours after the procedure: Avoid using heat on the injection site. Do not take baths, swim, or use a hot tub until your health care provider approves. Ask your health care provider if you may take showers. You may only be allowed to take sponge baths. If directed, put ice on the injection site. To do this: Put ice in a plastic bag. Place a towel between your skin and the bag. Leave the ice on for 20 minutes, 2-3 times a day.  Activity If you were given a sedative during the procedure, it can affect you for several hours. Do not drive or operate machinery until your health care provider says that it is safe. Return to your normal activities as told by your health care provider. Ask your health care provider what activities are safe for you. General instructions Take over-the-counter and  prescription medicines only as told by your health care provider. Drink enough fluid to keep your urine pale yellow. Keep all follow-up visits as told by your health care provider. This is important. Contact a health care provider if: You have any of these signs of infection: Redness, swelling, or pain around your injection site. Fluid or blood coming from your injection site. Warmth coming from your injection site. Pus or a bad smell coming from your injection site. A fever. You continue to have pain and soreness around the injection site, even after taking over-the-counter pain medicine. You have severe, sudden, or lasting nausea or vomiting. Get help right away if: You have severe pain at the injection site that is not relieved by medicines. You develop a severe headache or a stiff neck. You become sensitive to light. You have any new numbness or weakness in your legs or arms. You lose control of your bladder or bowel  movements. You have trouble breathing. Summary An epidural steroid injection is a shot of steroid medicine and numbing medicine that is given into the epidural space. The shot helps relieve pain caused by an irritated or swollen nerve root. You are more likely to benefit from this injection if your pain is strong and comes on suddenly rather than if you have had chronic pain. This information is not intended to replace advice given to you by your health care provider. Make sure you discuss any questions you have with your health care provider. Document Revised: 02/08/2020 Document Reviewed: 04/23/2019 Elsevier Patient Education  [place].

## 2021-07-09 ENCOUNTER — Other Ambulatory Visit: Payer: Self-pay

## 2021-07-09 ENCOUNTER — Ambulatory Visit
Admission: RE | Admit: 2021-07-09 | Discharge: 2021-07-09 | Disposition: A | Discharge status: Home / Self Care | Payer: Medicare Other | Source: Ambulatory Visit | Attending: Family Medicine | Admitting: Family Medicine

## 2021-07-09 DIAGNOSIS — M48062 Spinal stenosis, lumbar region with neurogenic claudication: Secondary | ICD-10-CM

## 2021-07-09 DIAGNOSIS — M4727 Other spondylosis with radiculopathy, lumbosacral region: Secondary | ICD-10-CM | POA: Diagnosis not present

## 2021-07-09 DIAGNOSIS — M5416 Radiculopathy, lumbar region: Secondary | ICD-10-CM

## 2021-07-09 MED ORDER — METHYLPREDNISOLONE ACETATE 40 MG/ML INJ SUSP (RADIOLOG
80.0000 mg | Freq: Once | INTRAMUSCULAR | Status: AC
  Administered 2021-07-09: 80 mg via EPIDURAL

## 2021-07-09 MED ORDER — IOPAMIDOL (ISOVUE-M 200) INJECTION 41%
1.0000 mL | Freq: Once | INTRAMUSCULAR | Status: AC
  Administered 2021-07-09: 1 mL via EPIDURAL

## 2021-07-09 NOTE — Discharge Instructions (Signed)

## 2021-07-31 ENCOUNTER — Other Ambulatory Visit: Payer: Self-pay | Admitting: Internal Medicine

## 2021-08-12 ENCOUNTER — Other Ambulatory Visit: Payer: Self-pay | Admitting: Internal Medicine

## 2021-08-12 DIAGNOSIS — Z1231 Encounter for screening mammogram for malignant neoplasm of breast: Secondary | ICD-10-CM

## 2021-09-10 DIAGNOSIS — H26493 Other secondary cataract, bilateral: Secondary | ICD-10-CM | POA: Diagnosis not present

## 2021-09-10 DIAGNOSIS — Z961 Presence of intraocular lens: Secondary | ICD-10-CM | POA: Diagnosis not present

## 2021-09-10 DIAGNOSIS — H3552 Pigmentary retinal dystrophy: Secondary | ICD-10-CM | POA: Diagnosis not present

## 2021-09-10 DIAGNOSIS — E119 Type 2 diabetes mellitus without complications: Secondary | ICD-10-CM | POA: Diagnosis not present

## 2021-09-14 ENCOUNTER — Ambulatory Visit
Admission: RE | Admit: 2021-09-14 | Discharge: 2021-09-14 | Disposition: A | Discharge status: Home / Self Care | Payer: Medicare Other | Source: Ambulatory Visit | Attending: Internal Medicine | Admitting: Internal Medicine

## 2021-09-14 DIAGNOSIS — Z1231 Encounter for screening mammogram for malignant neoplasm of breast: Secondary | ICD-10-CM

## 2021-10-21 ENCOUNTER — Telehealth: Payer: Self-pay | Admitting: Internal Medicine

## 2021-10-21 NOTE — Telephone Encounter (Signed)
1.Medication Requested: JENTADUETO 2.02-999 MG TABS pravastatin (PRAVACHOL) 40 MG tablet   2. Pharmacy (Name, Street, [place]): [place], [place]  Phone:  [phone] Fax:  [phone]   3. On Med List: yes  4. Last Visit with PCP: 08.11.22  5. Next visit date with PCP: 02.13.23  **[facility] says [facility] rx is needed for both prescriptions**   Agent: Please be advised that RX refills may take up to 3 business days. We ask that you follow-up with your pharmacy.

## 2021-10-23 MED ORDER — JENTADUETO 2.5-1000 MG PO TABS: ORAL_TABLET | ORAL | 3 refills | Status: DC

## 2021-10-23 MED ORDER — PRAVASTATIN SODIUM 40 MG PO TABS
40.0000 mg | ORAL_TABLET | Freq: Every day | ORAL | 3 refills | Status: DC
Start: 2021-10-23 — End: 2022-12-22

## 2021-10-23 NOTE — Telephone Encounter (Signed)
Refills have been sent to the patient's pharmacy 

## 2021-12-07 ENCOUNTER — Other Ambulatory Visit: Payer: Self-pay

## 2021-12-07 ENCOUNTER — Encounter: Payer: Self-pay | Admitting: Internal Medicine

## 2021-12-07 ENCOUNTER — Ambulatory Visit (INDEPENDENT_AMBULATORY_CARE_PROVIDER_SITE_OTHER): Payer: Medicare Other | Admitting: Internal Medicine

## 2021-12-07 VITALS — BP 128/78 | HR 75 | Resp 18 | Ht 63.0 in | Wt 178.6 lb

## 2021-12-07 DIAGNOSIS — I700 Atherosclerosis of aorta: Secondary | ICD-10-CM

## 2021-12-07 DIAGNOSIS — E118 Type 2 diabetes mellitus with unspecified complications: Secondary | ICD-10-CM

## 2021-12-07 DIAGNOSIS — E1169 Type 2 diabetes mellitus with other specified complication: Secondary | ICD-10-CM | POA: Diagnosis not present

## 2021-12-07 DIAGNOSIS — Z23 Encounter for immunization: Secondary | ICD-10-CM

## 2021-12-07 DIAGNOSIS — I10 Essential (primary) hypertension: Secondary | ICD-10-CM | POA: Diagnosis not present

## 2021-12-07 DIAGNOSIS — E785 Hyperlipidemia, unspecified: Secondary | ICD-10-CM

## 2021-12-07 LAB — CBC
HCT: 37.8 % (ref 36.0–46.0)
Hemoglobin: 12.3 g/dL (ref 12.0–15.0)
MCHC: 32.7 g/dL (ref 30.0–36.0)
MCV: 89.5 fl (ref 78.0–100.0)
Platelets: 224 10*3/uL (ref 150.0–400.0)
RBC: 4.22 Mil/uL (ref 3.87–5.11)
RDW: 15.8 % — ABNORMAL HIGH (ref 11.5–15.5)
WBC: 5.6 10*3/uL (ref 4.0–10.5)

## 2021-12-07 LAB — MICROALBUMIN / CREATININE URINE RATIO
Creatinine,U: 131 mg/dL
Microalb Creat Ratio: 0.9 mg/g (ref 0.0–30.0)
Microalb, Ur: 1.2 mg/dL (ref 0.0–1.9)

## 2021-12-07 LAB — HEMOGLOBIN A1C: Hgb A1c MFr Bld: 7.1 % — ABNORMAL HIGH (ref 4.6–6.5)

## 2021-12-07 NOTE — Patient Instructions (Signed)
We will check the labs today. 

## 2021-12-08 LAB — COMPREHENSIVE METABOLIC PANEL
ALT: 14 U/L (ref 0–35)
AST: 13 U/L (ref 0–37)
Albumin: 4.6 g/dL (ref 3.5–5.2)
Alkaline Phosphatase: 71 U/L (ref 39–117)
BUN: 14 mg/dL (ref 6–23)
CO2: 30 mEq/L (ref 19–32)
Calcium: 10 mg/dL (ref 8.4–10.5)
Chloride: 104 mEq/L (ref 96–112)
Creatinine, Ser: 0.75 mg/dL (ref 0.40–1.20)
GFR: 82.6 mL/min (ref >60.00)
Glucose, Bld: 138 mg/dL — ABNORMAL HIGH (ref 70–99)
Potassium: 4.1 mEq/L (ref 3.5–5.1)
Sodium: 140 mEq/L (ref 135–145)
Total Bilirubin: 0.3 mg/dL (ref 0.2–1.2)
Total Protein: 7.8 g/dL (ref 6.0–8.3)

## 2021-12-08 LAB — LIPID PANEL
Cholesterol: 170 mg/dL (ref 0–200)
HDL: 53.4 mg/dL (ref >39.00)
LDL Cholesterol: 88 mg/dL (ref 0–99)
NonHDL: 116.88
Total CHOL/HDL Ratio: 3
Triglycerides: 142 mg/dL (ref 0.0–149.0)
VLDL: 28.4 mg/dL (ref 0.0–40.0)

## 2021-12-09 NOTE — Assessment & Plan Note (Signed)
Checking HgA1c, lipid panel, foot exam done. Adjust rybelsus and jentadueto as needed. Is on pravastatin but has been taking only 20 mg daily (we asked her to switch to 40 mg daily in 2021). Adjust as needed. On ARB.

## 2021-12-09 NOTE — Assessment & Plan Note (Signed)
BP at goal on losartan/hctz 50/12.5 mg daily. Checking CMP and adjust as needed. 

## 2021-12-09 NOTE — Assessment & Plan Note (Signed)
Is taking pravastatin 20 mg daily, prior lipids not at goal. Will recheck and she is open to changing dose if not at goal.

## 2021-12-09 NOTE — Progress Notes (Signed)
° °  Subjective:   Patient ID: Patricia Elliott, female    DOB: 08-09-1956, 66 y.o.   MRN: [id number]  HPI The patient is a 66 YO female coming in for follow up. Eye exam at [facility].  Review of Systems  Constitutional: Negative.   HENT: Negative.    Eyes: Negative.   Respiratory:  Negative for cough, chest tightness and shortness of breath.   Cardiovascular:  Negative for chest pain, palpitations and leg swelling.  Gastrointestinal:  Negative for abdominal distention, abdominal pain, constipation, diarrhea, nausea and vomiting.  Musculoskeletal:  Positive for arthralgias.  Skin: Negative.   Neurological: Negative.   Psychiatric/Behavioral: Negative.     Objective:  Physical Exam Constitutional:      Appearance: She is well-developed.  HENT:     Head: Normocephalic and atraumatic.  Cardiovascular:     Rate and Rhythm: Normal rate and regular rhythm.  Pulmonary:     Effort: Pulmonary effort is normal. No respiratory distress.     Breath sounds: Normal breath sounds. No wheezing or rales.  Abdominal:     General: Bowel sounds are normal. There is no distension.     Palpations: Abdomen is soft.     Tenderness: There is no abdominal tenderness. There is no rebound.  Musculoskeletal:     Cervical back: Normal range of motion.  Skin:    General: Skin is warm and dry.     Comments: Foot exam done  Neurological:     Mental Status: She is alert and oriented to person, place, and time.     Coordination: Coordination normal.    Vitals:   12/07/21 0949  BP: 128/78  Pulse: 75  Resp: 18  SpO2: 99%  Weight: 178 lb 9.6 oz (81 kg)  Height: 5\' 3"  (1.6 m)    This visit occurred during the SARS-CoV-2 public health emergency.  Safety protocols were in place, including screening questions prior to the visit, additional usage of staff PPE, and extensive cleaning of exam room while observing appropriate contact time as indicated for disinfecting solutions.   Assessment & Plan:   Prevnar 20 and flu shot given at visit

## 2021-12-09 NOTE — Assessment & Plan Note (Signed)
Checking lipid panel. She is taking pravastatin 20 mg daily (prescribed 40 mg daily). Adjust as needed for LDL <100.

## 2022-04-08 ENCOUNTER — Telehealth: Payer: Self-pay | Admitting: Internal Medicine

## 2022-04-08 NOTE — Telephone Encounter (Signed)
LVM for pt to rtn my call to schedule AWV with [facility].  

## 2022-04-30 ENCOUNTER — Ambulatory Visit (INDEPENDENT_AMBULATORY_CARE_PROVIDER_SITE_OTHER): Payer: Medicare Other

## 2022-04-30 DIAGNOSIS — Z0000 Encounter for general adult medical examination without abnormal findings: Secondary | ICD-10-CM

## 2022-04-30 DIAGNOSIS — Z780 Asymptomatic menopausal state: Secondary | ICD-10-CM

## 2022-04-30 NOTE — Patient Instructions (Signed)
[name] , Thank you for taking time to come for your Medicare Wellness Visit. I appreciate your ongoing commitment to your health goals. Please review the following plan we discussed and let me know if I can assist you in the future.   Screening recommendations/referrals: Colonoscopy: 05/10/2017 Mammogram: 09/14/2021 Bone Density: referral 04/30/2022 Recommended yearly ophthalmology/optometry visit for glaucoma screening and checkup Recommended yearly dental visit for hygiene and checkup  Vaccinations: Influenza vaccine: completed  Pneumococcal vaccine: completed  Tdap vaccine: due  Shingles vaccine: will consider     Advanced directives: yes   Conditions/risks identified: none   Next appointment: none    Preventive Care 68 Years and Older, Female Preventive care refers to lifestyle choices and visits with your health care provider that can promote health and wellness. What does preventive care include? A yearly physical exam. This is also called an annual well check. Dental exams once or twice a year. Routine eye exams. Ask your health care provider how often you should have your eyes checked. Personal lifestyle choices, including: Daily care of your teeth and gums. Regular physical activity. Eating a healthy diet. Avoiding tobacco and drug use. Limiting alcohol use. Practicing safe sex. Taking low-dose aspirin every day. Taking vitamin and mineral supplements as recommended by your health care provider. What happens during an annual well check? The services and screenings done by your health care provider during your annual well check will depend on your age, overall health, lifestyle risk factors, and family history of disease. Counseling  Your health care provider may ask you questions about your: Alcohol use. Tobacco use. Drug use. Emotional well-being. Home and relationship well-being. Sexual activity. Eating habits. History of falls. Memory and ability to  understand (cognition). Work and work Statistician. Reproductive health. Screening  You may have the following tests or measurements: Height, weight, and BMI. Blood pressure. Lipid and cholesterol levels. These may be checked every 5 years, or more frequently if you are over 62 years old. Skin check. Lung cancer screening. You may have this screening every year starting at age 63 if you have a 30-pack-year history of smoking and currently smoke or have quit within the past 15 years. Fecal occult blood test (FOBT) of the stool. You may have this test every year starting at age 40. Flexible sigmoidoscopy or colonoscopy. You may have a sigmoidoscopy every 5 years or a colonoscopy every 10 years starting at age 6. Hepatitis C blood test. Hepatitis B blood test. Sexually transmitted disease (STD) testing. Diabetes screening. This is done by checking your blood sugar (glucose) after you have not eaten for a while (fasting). You may have this done every 1-3 years. Bone density scan. This is done to screen for osteoporosis. You may have this done starting at age 51. Mammogram. This may be done every 1-2 years. Talk to your health care provider about how often you should have regular mammograms. Talk with your health care provider about your test results, treatment options, and if necessary, the need for more tests. Vaccines  Your health care provider may recommend certain vaccines, such as: Influenza vaccine. This is recommended every year. Tetanus, diphtheria, and acellular pertussis (Tdap, Td) vaccine. You may need a Td booster every 10 years. Zoster vaccine. You may need this after age 73. Pneumococcal 13-valent conjugate (PCV13) vaccine. One dose is recommended after age 57. Pneumococcal polysaccharide (PPSV23) vaccine. One dose is recommended after age 1. Talk to your health care provider about which screenings and vaccines you need and  how often you need them. This information is not  intended to replace advice given to you by your health care provider. Make sure you discuss any questions you have with your health care provider. Document Released: 11/07/2015 Document Revised: 06/30/2016 Document Reviewed: 08/12/2015 Elsevier Interactive Patient Education  2017 [place] Prevention in the Home Falls can cause injuries. They can happen to people of all ages. There are many things you can do to make your home safe and to help prevent falls. What can I do on the outside of my home? Regularly fix the edges of walkways and driveways and fix any cracks. Remove anything that might make you trip as you walk through a door, such as a raised step or threshold. Trim any bushes or trees on the path to your home. Use bright outdoor lighting. Clear any walking paths of anything that might make someone trip, such as rocks or tools. Regularly check to see if handrails are loose or broken. Make sure that both sides of any steps have handrails. Any raised decks and porches should have guardrails on the edges. Have any leaves, snow, or ice cleared regularly. Use sand or salt on walking paths during winter. Clean up any spills in your garage right away. This includes oil or grease spills. What can I do in the bathroom? Use night lights. Install grab bars by the toilet and in the tub and shower. Do not use towel bars as grab bars. Use non-skid mats or decals in the tub or shower. If you need to sit down in the shower, use a plastic, non-slip stool. Keep the floor dry. Clean up any water that spills on the floor as soon as it happens. Remove soap buildup in the tub or shower regularly. Attach bath mats securely with double-sided non-slip rug tape. Do not have throw rugs and other things on the floor that can make you trip. What can I do in the bedroom? Use night lights. Make sure that you have a light by your bed that is easy to reach. Do not use any sheets or blankets that are  too big for your bed. They should not hang down onto the floor. Have a firm chair that has side arms. You can use this for support while you get dressed. Do not have throw rugs and other things on the floor that can make you trip. What can I do in the kitchen? Clean up any spills right away. Avoid walking on wet floors. Keep items that you use a lot in easy-to-reach places. If you need to reach something above you, use a strong step stool that has a grab bar. Keep electrical cords out of the way. Do not use floor polish or wax that makes floors slippery. If you must use wax, use non-skid floor wax. Do not have throw rugs and other things on the floor that can make you trip. What can I do with my stairs? Do not leave any items on the stairs. Make sure that there are handrails on both sides of the stairs and use them. Fix handrails that are broken or loose. Make sure that handrails are as long as the stairways. Check any carpeting to make sure that it is firmly attached to the stairs. Fix any carpet that is loose or worn. Avoid having throw rugs at the top or bottom of the stairs. If you do have throw rugs, attach them to the floor with carpet tape. Make sure that you have  a light switch at the top of the stairs and the bottom of the stairs. If you do not have them, ask someone to add them for you. What else can I do to help prevent falls? Wear shoes that: Do not have high heels. Have rubber bottoms. Are comfortable and fit you well. Are closed at the toe. Do not wear sandals. If you use a stepladder: Make sure that it is fully opened. Do not climb a closed stepladder. Make sure that both sides of the stepladder are locked into place. Ask someone to hold it for you, if possible. Clearly mark and make sure that you can see: Any grab bars or handrails. First and last steps. Where the edge of each step is. Use tools that help you move around (mobility aids) if they are needed. These  include: Canes. Walkers. Scooters. Crutches. Turn on the lights when you go into a dark area. Replace any light bulbs as soon as they burn out. Set up your furniture so you have a clear path. Avoid moving your furniture around. If any of your floors are uneven, fix them. If there are any pets around you, be aware of where they are. Review your medicines with your doctor. Some medicines can make you feel dizzy. This can increase your chance of falling. Ask your doctor what other things that you can do to help prevent falls. This information is not intended to replace advice given to you by your health care provider. Make sure you discuss any questions you have with your health care provider. Document Released: 08/07/2009 Document Revised: 03/18/2016 Document Reviewed: 11/15/2014 Elsevier Interactive Patient Education  2017 [employer].

## 2022-04-30 NOTE — Progress Notes (Cosign Needed Addendum)
Subjective:   Patricia Elliott is a 66 y.o. female who presents for Medicare Annual (Subsequent) preventive examination.   I connected with [name]  today by telephone and verified that I am speaking with the correct person using two identifiers. Location patient: home Location provider: work Persons participating in the virtual visit: patient, provider.   I discussed the limitations, risks, security and privacy concerns of performing an evaluation and management service by telephone and the availability of in person appointments. I also discussed with the patient that there may be a patient responsible charge related to this service. The patient expressed understanding and verbally consented to this telephonic visit.    Interactive audio and video telecommunications were attempted between this provider and patient, however failed, due to patient having technical difficulties OR patient did not have access to video capability.  We continued and completed visit with audio only.    Review of Systems     Cardiac Risk Factors include: advanced age (>68mn, >>52women);diabetes mellitus;hypertension     Objective:    Today's Vitals   There is no height or weight on file to calculate BMI.     04/30/2022   11:16 AM 04/07/2021    9:51 AM 12/19/2018    8:26 AM 12/02/2017   12:13 PM  Advanced Directives  Does Patient Have a Medical Advance Directive? Yes Yes Yes Yes  Type of AParamedicof AJuana Di­azLiving will H[place]Living will [place]reno del MarLiving will [place]Living will  Does patient want to make changes to medical advance directive?  No - Patient declined    Copy of HAnthonin Chart? No - copy requested No - copy requested No - copy requested No - copy requested    Current Medications (verified) Outpatient Encounter Medications as of 04/30/2022  Medication Sig   esomeprazole  (NEXIUM) 20 MG capsule Take 20 mg by mouth daily at 12 noon.   glucose blood test strip Use BID to test CBG's   linaGLIPtin-metFORMIN HCl (JENTADUETO) 2.02-999 MG TABS TAKE 1 TABLET IN THE MORNING AND AT BEDTIME   losartan-hydrochlorothiazide (HYZAAR) 50-12.5 MG tablet TAKE 1 TABLET DAILY   Multiple Vitamin (MULTIVITAMIN) tablet Take 1 tablet by mouth daily.   pravastatin (PRAVACHOL) 40 MG tablet Take 1 tablet (40 mg total) by mouth daily.   RYBELSUS 3 MG TABS TAKE 1 TABLET DAILY IN THE MORNING 30 MINUTES BEFORE A MEAL   LORazepam (ATIVAN) 0.5 MG tablet 1-2 tabs 30 - 60 min prior to MRI. Do not drive with this medicine. (Patient not taking: Reported on 04/30/2022)   methocarbamol (ROBAXIN) 500 MG tablet Take 1 tablet (500 mg total) by mouth 2 (two) times daily as needed for muscle spasms. (Patient not taking: Reported on 04/30/2022)   Omega-3 Fatty Acids (FISH OIL) 500 MG CAPS Take 500 capsules by mouth in the morning and at bedtime. (Patient not taking: Reported on 04/30/2022)   pravastatin (PRAVACHOL) 20 MG tablet Take 20 mg by mouth daily. (Patient not taking: Reported on 04/30/2022)   No facility-administered encounter medications on file as of 04/30/2022.    Allergies (verified) Patient has no known allergies.   History: Past Medical History:  Diagnosis Date   Arthritis    Diabetes mellitus    GERD (gastroesophageal reflux disease)    Hyperlipidemia    Hypertension    Retinitis pigmentosa    Past Surgical History:  Procedure Laterality Date   ABDOMINAL HYSTERECTOMY  CESAREAN SECTION     Family History  Problem Relation Age of Onset   Hypertension Mother    Diabetes Father    Heart disease Father    Hypertension Sister    Diabetes Brother    Hypertension Brother    Diabetes Son    Breast cancer Other    Colon cancer Neg Hx    Stomach cancer Neg Hx    Social History   Socioeconomic History   Marital status: Married    Spouse name: Not on file   Number of children: 1    Years of education: Not on file   Highest education level: Not on file  Occupational History   Not on file  Tobacco Use   Smoking status: Never   Smokeless tobacco: Never  Vaping Use   Vaping Use: Never used  Substance and Sexual Activity   Alcohol use: No   Drug use: No   Sexual activity: Yes    Partners: Male  Other Topics Concern   Not on file  Social History Narrative   Not on file   Social Determinants of Health   Financial Resource Strain: Low Risk  (04/30/2022)   Overall Financial Resource Strain (CARDIA)    Difficulty of Paying Living Expenses: Not hard at all  Food Insecurity: No Food Insecurity (04/30/2022)   Hunger Vital Sign    Worried About Running Out of Food in the Last Year: Never true    Ran Out of Food in the Last Year: Never true  Transportation Needs: No Transportation Needs (04/30/2022)   PRAPARE - Hydrologist (Medical): No    Lack of Transportation (Non-Medical): No  Physical Activity: Insufficiently Active (04/30/2022)   Exercise Vital Sign    Days of Exercise per Week: 2 days    Minutes of Exercise per Session: 20 min  Stress: No Stress Concern Present (04/30/2022)   [place]    Feeling of Stress : Not at all  Social Connections: Moderately Integrated (04/30/2022)   Social Connection and Isolation Panel [NHANES]    Frequency of Communication with Friends and Family: Three times a week    Frequency of Social Gatherings with Friends and Family: Three times a week    Attends Religious Services: More than 4 times per year    Active Member of Clubs or Organizations: No    Attends Archivist Meetings: Never    Marital Status: Married    Tobacco Counseling Counseling given: Not Answered   Clinical Intake:  Pre-visit preparation completed: Yes  Pain : No/denies pain     Nutritional Risks: None  How often do you need to have someone help you when  you read instructions, pamphlets, or other written materials from your doctor or pharmacy?: 1 - Never What is the last grade level you completed in school?: High School  Diabetic?yes  Nutrition Risk Assessment:  Has the patient had any N/V/D within the last 2 months?  No  Does the patient have any non-healing wounds?  No  Has the patient had any unintentional weight loss or weight gain?  No   Diabetes:  Is the patient diabetic?  Yes  If diabetic, was a CBG obtained today?  No  Did the patient bring in their glucometer from home?  No  How often do you monitor your CBG's? Daily .   Financial Strains and Diabetes Management:  Are you having any financial strains with  the device, your supplies or your medication? No .  Does the patient want to be seen by Chronic Care Management for management of their diabetes?  No  Would the patient like to be referred to a Nutritionist or for Diabetic Management?  No   Diabetic Exams:  Diabetic Eye Exam: Completed 08/2021 Diabetic Foot Exam: Overdue, Pt has been advised about the importance in completing this exam. Pt is scheduled for diabetic foot exam on next office exams .   Interpreter Needed?: No  Information entered by :: [email or web address]   Activities of Daily Living    04/30/2022   11:22 AM  In your present state of health, do you have any difficulty performing the following activities:  Hearing? 0  Vision? 0  Difficulty concentrating or making decisions? 0  Walking or climbing stairs? 0  Dressing or bathing? 0  Doing errands, shopping? 0  Preparing Food and eating ? N  Using the Toilet? N  In the past six months, have you accidently leaked urine? N  Do you have problems with loss of bowel control? N  Managing your Medications? N  Managing your Finances? N  Housekeeping or managing your Housekeeping? N    Patient Care Team: Hoyt Koch, MD as PCP - General (Internal Medicine) Katy Fitch, Darlina Guys, MD as Consulting  Physician (Ophthalmology)  Indicate any recent Medical Services you may have received from other than [facility] providers in the past year (date may be approximate).     Assessment:   This is a routine wellness examination for Hubert.  Hearing/Vision screen Vision Screening - Comments:: Annual eye exams wears glasses   Dietary issues and exercise activities discussed: Current Exercise Habits: Home exercise routine, Type of exercise: walking, Time (Minutes): 20, Frequency (Times/Week): 2, Weekly Exercise (Minutes/Week): 40, Intensity: Mild   Goals Addressed             This Visit's Progress    Patient Stated   On track    Lose weight by use portion control with food, increase water intake, investigate the right fit for me to increase physical activity. Perhaps start to ride my stationary bike again.        Depression Screen    04/30/2022   11:17 AM 04/30/2022   11:15 AM 04/07/2021   10:12 AM 03/13/2020   10:50 AM 12/19/2018    8:27 AM 12/02/2017    1:22 PM  PHQ 2/9 Scores  PHQ - 2 Score 0 0 0 0 0 1  PHQ- 9 Score      1    Fall Risk    04/30/2022   11:17 AM 04/07/2021   10:18 AM 03/13/2020   10:50 AM 12/19/2018    8:27 AM 12/02/2017    1:22 PM  [place] in the past year? 0 0 1 0 No  Number falls in past yr: 0 0 1    Injury with Fall? 0 0 1    Risk for fall due to :  No Fall Risks     Follow up Falls evaluation completed;Education provided Falls evaluation completed       [place]:  Any stairs in or around the home? Yes  If so, are there any without handrails? No  Home free of loose throw rugs in walkways, pet beds, electrical cords, etc? Yes  Adequate lighting in your home to reduce risk of falls? Yes   ASSISTIVE DEVICES UTILIZED TO PREVENT FALLS:  Life  alert? No  Use of a cane, walker or w/c? No  Grab bars in the bathroom? Yes  Shower chair or bench in shower? No  Elevated toilet seat or a handicapped toilet? No      Cognitive Function:    Normal cognitive status assessed by telephone conversation  by this Nurse Health Advisor. No abnormalities found.      Immunizations Immunization History  Administered Date(s) Administered   Fluad Quad(high Dose 65+) 12/07/2021   Influenza Whole 10/25/2005   Influenza,inj,Quad PF,6+ Mos 12/06/2017, 12/19/2018   Influenza-Unspecified 08/25/2020   PFIZER(Purple Top)SARS-COV-2 Vaccination 02/04/2020, 02/25/2020, 08/25/2020   PNEUMOCOCCAL CONJUGATE-20 12/07/2021   Td 10/25/2006    TDAP status: Up to date  Flu Vaccine status: Up to date  Pneumococcal vaccine status: Up to date  Covid-19 vaccine status: Completed vaccines  Qualifies for Shingles Vaccine? Yes   Zostavax completed No   Shingrix Completed?: No.    Education has been provided regarding the importance of this vaccine. Patient has been advised to call insurance company to determine out of pocket expense if they have not yet received this vaccine. Advised may also receive vaccine at local pharmacy or Health Dept. Verbalized acceptance and understanding.  Screening Tests Health Maintenance  Topic Date Due   Zoster Vaccines- Shingrix (1 of 2) Never done   TETANUS/TDAP  10/25/2016   DEXA SCAN  Never done   OPHTHALMOLOGY EXAM  09/03/2020   COVID-19 Vaccine (4 - Pfizer series) 10/20/2020   INFLUENZA VACCINE  05/25/2022   HEMOGLOBIN A1C  06/06/2022   MAMMOGRAM  09/14/2022   Diabetic kidney evaluation - GFR measurement  12/07/2022   Diabetic kidney evaluation - Urine ACR  12/07/2022   FOOT EXAM  12/07/2022   COLONOSCOPY (Pts 45-18yr Insurance coverage will need to be confirmed)  05/11/2027   Pneumonia Vaccine 66 Years old  Completed   Hepatitis C Screening  Completed   HPV VACCINES  Aged Out    Health Maintenance  Health Maintenance Due  Topic Date Due   Zoster Vaccines- Shingrix (1 of 2) Never done   TETANUS/TDAP  10/25/2016   DEXA SCAN  Never done   OPHTHALMOLOGY EXAM   09/03/2020   COVID-19 Vaccine (4 - Pfizer series) 10/20/2020    Colorectal cancer screening: Type of screening: Colonoscopy. Completed 05/10/2017. Repeat every 10 years  Mammogram status: Completed 09/14/2021. Repeat every year  Bone Density status: Ordered 04/30/2022. Pt provided with contact info and advised to call to schedule appt.  Lung Cancer Screening: (Low Dose CT Chest recommended if Age 67-80years, 30 pack-year currently smoking OR have quit w/in 15years.) does not qualify.   Lung Cancer Screening Referral: n/a  Additional Screening:  Hepatitis C Screening: does not qualify; Completed 12/15/2016  Vision Screening: Recommended annual ophthalmology exams for early detection of glaucoma and other disorders of the eye. Is the patient up to date with their annual eye exam?  Yes  Who is the provider or what is the name of the office in which the patient attends annual eye exams? Dr. GKaty Fitch If pt is not established with a provider, would they like to be referred to a provider to establish care? No .   Dental Screening: Recommended annual dental exams for proper oral hygiene  Community Resource Referral / Chronic Care Management: CRR required this visit?  No   CCM required this visit?  No      Plan:     I have personally reviewed and noted the following in  the patient's chart:   Medical and social history Use of alcohol, tobacco or illicit drugs  Current medications and supplements including opioid prescriptions.  Functional ability and status Nutritional status Physical activity Advanced directives List of other physicians Hospitalizations, surgeries, and ER visits in previous 12 months Vitals Screenings to include cognitive, depression, and falls Referrals and appointments  In addition, I have reviewed and discussed with patient certain preventive protocols, quality metrics, and best practice recommendations. A written personalized care plan for preventive  services as well as general preventive health recommendations were provided to patient.     Randel Pigg, LPN   0/10/6427   Nurse Notes: none    Medical screening examination/treatment/procedure(s) were performed by non-physician practitioner and as supervising physician I was immediately available for consultation/collaboration.  I agree with above. Lew Dawes, MD

## 2022-05-17 ENCOUNTER — Other Ambulatory Visit: Payer: Self-pay | Admitting: Internal Medicine

## 2022-05-17 DIAGNOSIS — Z1231 Encounter for screening mammogram for malignant neoplasm of breast: Secondary | ICD-10-CM

## 2022-06-07 ENCOUNTER — Ambulatory Visit (INDEPENDENT_AMBULATORY_CARE_PROVIDER_SITE_OTHER): Payer: Medicare Other | Admitting: Internal Medicine

## 2022-06-07 ENCOUNTER — Encounter: Payer: Self-pay | Admitting: Internal Medicine

## 2022-06-07 VITALS — BP 118/76 | HR 75 | Resp 18 | Ht 63.0 in | Wt 172.2 lb

## 2022-06-07 DIAGNOSIS — E118 Type 2 diabetes mellitus with unspecified complications: Secondary | ICD-10-CM

## 2022-06-07 LAB — POCT GLYCOSYLATED HEMOGLOBIN (HGB A1C): Hemoglobin A1C: 6.6 % — AB (ref 4.0–5.6)

## 2022-06-07 MED ORDER — RYBELSUS 7 MG PO TABS: 7.0000 mg | ORAL_TABLET | Freq: Every day | ORAL | 3 refills | Status: DC

## 2022-06-07 NOTE — Progress Notes (Unsigned)
   Subjective:   Patient ID: Patricia Elliott, female    DOB: [birth year], 66 y.o.   MRN: [id number]  HPI The patient is a 66 YO female coming in for follow up.   Review of Systems  Constitutional: Negative.   HENT: Negative.    Eyes: Negative.   Respiratory:  Negative for cough, chest tightness and shortness of breath.   Cardiovascular:  Negative for chest pain, palpitations and leg swelling.  Gastrointestinal:  Negative for abdominal distention, abdominal pain, constipation, diarrhea, nausea and vomiting.  Musculoskeletal: Negative.   Skin: Negative.   Neurological: Negative.   Psychiatric/Behavioral: Negative.      Objective:  Physical Exam Constitutional:      Appearance: She is well-developed.  HENT:     Head: Normocephalic and atraumatic.  Cardiovascular:     Rate and Rhythm: Normal rate and regular rhythm.  Pulmonary:     Effort: Pulmonary effort is normal. No respiratory distress.     Breath sounds: Normal breath sounds. No wheezing or rales.  Abdominal:     General: Bowel sounds are normal. There is no distension.     Palpations: Abdomen is soft.     Tenderness: There is no abdominal tenderness. There is no rebound.  Musculoskeletal:     Cervical back: Normal range of motion.  Skin:    General: Skin is warm and dry.  Neurological:     Mental Status: She is alert and oriented to person, place, and time.     Coordination: Coordination normal.     Vitals:   06/07/22 0935  BP: 118/76  Pulse: 75  Resp: 18  SpO2: 97%  Weight: 172 lb 3.2 oz (78.1 kg)  Height: '5\' 3"'$  (1.6 m)    Assessment & Plan:

## 2022-06-07 NOTE — Patient Instructions (Addendum)
We will increase to the 7 mg of rybelsus to help more with the weight.  If you do want the memory evaluation let us know.

## 2022-06-09 NOTE — Assessment & Plan Note (Signed)
POC HgA1c done today at 6.6 which is improved since starting rybelsus. We will increase rybelsus to 7 mg daily and follow up in 3 months to help aid weight loss. She will continue jentadueto for now but likely can be changed to metformin only at follow up and rybelsus. She is on ARB and statin. Reminded about eye exam.

## 2022-06-16 ENCOUNTER — Telehealth: Payer: Self-pay | Admitting: Internal Medicine

## 2022-06-16 NOTE — Telephone Encounter (Signed)
Pt Rybelsus dosage was increased from 3 MG to 7 MG. She would like to know if she is supposed to continue taking '3MG'$  of rybelsus until she completes all the pills in that bottle or should she stop the 3 MG and go ahead and start taking the 7 MG.    Please advise.

## 2022-06-16 NOTE — Telephone Encounter (Signed)
Okay to take 2 pills of 3 mg rybelsus until gone (total 6 mg daily) then switch to 7 mg pills.

## 2022-06-16 NOTE — Telephone Encounter (Signed)
Spoke with the pt and was able to inform her of Dr. Charlynne Cousins instructions. Pt states she understood and has no questions or concerns.

## 2022-09-02 ENCOUNTER — Other Ambulatory Visit: Payer: Self-pay | Admitting: Internal Medicine

## 2022-09-20 ENCOUNTER — Ambulatory Visit
Admission: RE | Admit: 2022-09-20 | Discharge: 2022-09-20 | Disposition: A | Discharge status: Home / Self Care | Payer: Medicare Other | Source: Ambulatory Visit | Attending: Internal Medicine | Admitting: Internal Medicine

## 2022-09-20 DIAGNOSIS — Z1231 Encounter for screening mammogram for malignant neoplasm of breast: Secondary | ICD-10-CM

## 2022-09-22 ENCOUNTER — Other Ambulatory Visit: Payer: Self-pay | Admitting: Internal Medicine

## 2022-09-22 DIAGNOSIS — H26493 Other secondary cataract, bilateral: Secondary | ICD-10-CM | POA: Diagnosis not present

## 2022-09-22 DIAGNOSIS — H3552 Pigmentary retinal dystrophy: Secondary | ICD-10-CM | POA: Diagnosis not present

## 2022-09-22 DIAGNOSIS — N6310 Unspecified lump in the right breast, unspecified quadrant: Secondary | ICD-10-CM

## 2022-09-22 DIAGNOSIS — Z961 Presence of intraocular lens: Secondary | ICD-10-CM | POA: Diagnosis not present

## 2022-09-22 DIAGNOSIS — E119 Type 2 diabetes mellitus without complications: Secondary | ICD-10-CM | POA: Diagnosis not present

## 2022-09-22 LAB — HM DIABETES EYE EXAM

## 2022-09-23 NOTE — Progress Notes (Signed)
[employer]

## 2022-10-07 ENCOUNTER — Ambulatory Visit
Admission: RE | Admit: 2022-10-07 | Discharge: 2022-10-07 | Disposition: A | Discharge status: Home / Self Care | Payer: Medicare Other | Source: Ambulatory Visit | Attending: Internal Medicine | Admitting: Internal Medicine

## 2022-10-07 DIAGNOSIS — N6489 Other specified disorders of breast: Secondary | ICD-10-CM | POA: Diagnosis not present

## 2022-10-07 DIAGNOSIS — R928 Other abnormal and inconclusive findings on diagnostic imaging of breast: Secondary | ICD-10-CM | POA: Diagnosis not present

## 2022-10-07 DIAGNOSIS — N6310 Unspecified lump in the right breast, unspecified quadrant: Secondary | ICD-10-CM

## 2022-10-22 ENCOUNTER — Other Ambulatory Visit: Payer: Self-pay | Admitting: Internal Medicine

## 2022-10-27 ENCOUNTER — Ambulatory Visit
Admission: RE | Admit: 2022-10-27 | Discharge: 2022-10-27 | Disposition: A | Discharge status: Home / Self Care | Payer: Medicare Other | Source: Ambulatory Visit | Attending: Internal Medicine | Admitting: Internal Medicine

## 2022-10-27 DIAGNOSIS — Z780 Asymptomatic menopausal state: Secondary | ICD-10-CM | POA: Diagnosis not present

## 2022-10-27 DIAGNOSIS — M85852 Other specified disorders of bone density and structure, left thigh: Secondary | ICD-10-CM | POA: Diagnosis not present

## 2022-12-09 ENCOUNTER — Ambulatory Visit (INDEPENDENT_AMBULATORY_CARE_PROVIDER_SITE_OTHER): Payer: Medicare Other | Admitting: Internal Medicine

## 2022-12-09 ENCOUNTER — Encounter: Payer: Self-pay | Admitting: Internal Medicine

## 2022-12-09 VITALS — BP 120/70 | HR 74 | Temp 98.4°F | Ht 63.0 in | Wt 168.0 lb

## 2022-12-09 DIAGNOSIS — E785 Hyperlipidemia, unspecified: Secondary | ICD-10-CM

## 2022-12-09 DIAGNOSIS — M48062 Spinal stenosis, lumbar region with neurogenic claudication: Secondary | ICD-10-CM | POA: Diagnosis not present

## 2022-12-09 DIAGNOSIS — I10 Essential (primary) hypertension: Secondary | ICD-10-CM | POA: Diagnosis not present

## 2022-12-09 DIAGNOSIS — E1169 Type 2 diabetes mellitus with other specified complication: Secondary | ICD-10-CM

## 2022-12-09 DIAGNOSIS — E118 Type 2 diabetes mellitus with unspecified complications: Secondary | ICD-10-CM

## 2022-12-09 DIAGNOSIS — I700 Atherosclerosis of aorta: Secondary | ICD-10-CM | POA: Diagnosis not present

## 2022-12-09 LAB — CBC
HCT: 38.7 % (ref 36.0–46.0)
Hemoglobin: 12.8 g/dL (ref 12.0–15.0)
MCHC: 33 g/dL (ref 30.0–36.0)
MCV: 90 fl (ref 78.0–100.0)
Platelets: 215 10*3/uL (ref 150.0–400.0)
RBC: 4.3 Mil/uL (ref 3.87–5.11)
RDW: 15.5 % (ref 11.5–15.5)
WBC: 5.1 10*3/uL (ref 4.0–10.5)

## 2022-12-09 LAB — MICROALBUMIN / CREATININE URINE RATIO
Creatinine,U: 145.8 mg/dL
Microalb Creat Ratio: 0.7 mg/g (ref 0.0–30.0)
Microalb, Ur: 1.1 mg/dL (ref 0.0–1.9)

## 2022-12-09 LAB — COMPREHENSIVE METABOLIC PANEL
ALT: 15 U/L (ref 0–35)
AST: 14 U/L (ref 0–37)
Albumin: 4.4 g/dL (ref 3.5–5.2)
Alkaline Phosphatase: 67 U/L (ref 39–117)
BUN: 15 mg/dL (ref 6–23)
CO2: 29 mEq/L (ref 19–32)
Calcium: 10 mg/dL (ref 8.4–10.5)
Chloride: 103 mEq/L (ref 96–112)
Creatinine, Ser: 0.75 mg/dL (ref 0.40–1.20)
GFR: 82.02 mL/min (ref >60.00)
Glucose, Bld: 117 mg/dL — ABNORMAL HIGH (ref 70–99)
Potassium: 4.1 mEq/L (ref 3.5–5.1)
Sodium: 141 mEq/L (ref 135–145)
Total Bilirubin: 0.3 mg/dL (ref 0.2–1.2)
Total Protein: 7.9 g/dL (ref 6.0–8.3)

## 2022-12-09 LAB — LIPID PANEL
Cholesterol: 183 mg/dL (ref 0–200)
HDL: 56.1 mg/dL (ref >39.00)
LDL Cholesterol: 110 mg/dL — ABNORMAL HIGH (ref 0–99)
NonHDL: 127.08
Total CHOL/HDL Ratio: 3
Triglycerides: 84 mg/dL (ref 0.0–149.0)
VLDL: 16.8 mg/dL (ref 0.0–40.0)

## 2022-12-09 LAB — HEMOGLOBIN A1C: Hgb A1c MFr Bld: 6.4 % (ref 4.6–6.5)

## 2022-12-09 NOTE — Assessment & Plan Note (Signed)
Checking CMP and CBC and adjust as needed. BP is at goal on losartan/hctz 50/12.5 mg daily.

## 2022-12-09 NOTE — Assessment & Plan Note (Signed)
Checking lipid panel and adjust pravastatin 40 mg daily as needed for goal LDL<100 (<70 ideal).

## 2022-12-09 NOTE — Progress Notes (Signed)
   Subjective:   Patient ID: Patricia Elliott, female    DOB: 31-Mar-1956, 67 y.o.   MRN: [id number]  HPI The patient is a 67 YO female coming in for follow up.   Review of Systems  Constitutional: Negative.   HENT: Negative.    Eyes: Negative.   Respiratory:  Negative for cough, chest tightness and shortness of breath.   Cardiovascular:  Negative for chest pain, palpitations and leg swelling.  Gastrointestinal:  Negative for abdominal distention, abdominal pain, constipation, diarrhea, nausea and vomiting.  Musculoskeletal: Negative.   Skin: Negative.   Neurological: Negative.   Psychiatric/Behavioral: Negative.      Objective:  Physical Exam Constitutional:      Appearance: She is well-developed.  HENT:     Head: Normocephalic and atraumatic.  Cardiovascular:     Rate and Rhythm: Normal rate and regular rhythm.  Pulmonary:     Effort: Pulmonary effort is normal. No respiratory distress.     Breath sounds: Normal breath sounds. No wheezing or rales.  Abdominal:     General: Bowel sounds are normal. There is no distension.     Palpations: Abdomen is soft.     Tenderness: There is no abdominal tenderness. There is no rebound.  Musculoskeletal:     Cervical back: Normal range of motion.  Skin:    General: Skin is warm and dry.     Comments: Foot exam done  Neurological:     Mental Status: She is alert and oriented to person, place, and time.     Coordination: Coordination normal.     Vitals:   12/09/22 0937  BP: 120/70  Pulse: 74  Temp: 98.4 F (36.9 C)  TempSrc: Oral  SpO2: 98%  Weight: 168 lb (76.2 kg)  Height: '5\' 3"'$  (1.6 m)    Assessment & Plan:

## 2022-12-09 NOTE — Assessment & Plan Note (Signed)
Taking pravastatin 40 mg daily and continue. Checking lipid panel and adjust dosing as needed.

## 2022-12-09 NOTE — Assessment & Plan Note (Signed)
Checking HgA1c, microalbumin creatinine ratio, CMP, lipid panel and adjust medication as needed. Taking jentadueto 2.02/999 mg BID and rybelsus 7 mg daily. Is on statin and ARB. Overall previously stable.

## 2022-12-09 NOTE — Assessment & Plan Note (Signed)
Still acting up and mild pain in the right leg with some rare tingling sensation.

## 2022-12-22 ENCOUNTER — Other Ambulatory Visit: Payer: Self-pay | Admitting: Internal Medicine

## 2022-12-23 ENCOUNTER — Telehealth: Payer: Self-pay | Admitting: Internal Medicine

## 2022-12-23 NOTE — Telephone Encounter (Signed)
Patient called and said that losartain is not available at [facility] - Patient states that they said another medication could be called in but that she would lilke to know what it will be before you send it in.  Please call patient at:  [phone]

## 2022-12-24 ENCOUNTER — Other Ambulatory Visit: Payer: Self-pay | Admitting: Radiology

## 2022-12-24 ENCOUNTER — Other Ambulatory Visit: Payer: Self-pay

## 2022-12-24 MED ORDER — LOSARTAN POTASSIUM-HCTZ 50-12.5 MG PO TABS: 1.0000 | ORAL_TABLET | Freq: Every day | ORAL | 3 refills | Status: DC

## 2022-12-24 NOTE — Telephone Encounter (Signed)
Would recommend to get at local pharmacy until mail order is back in stock. Okay to send 30 day or 90 day to any local

## 2022-12-28 ENCOUNTER — Other Ambulatory Visit: Payer: Self-pay

## 2022-12-28 MED ORDER — LOSARTAN POTASSIUM-HCTZ 50-12.5 MG PO TABS: 1.0000 | ORAL_TABLET | Freq: Every day | ORAL | 1 refills | Status: DC

## 2022-12-28 NOTE — Telephone Encounter (Signed)
Sent in 30-1 refill to local pharmacy. Patient will contact before going to pick this up to make sure they have it.

## 2023-02-26 ENCOUNTER — Other Ambulatory Visit: Payer: Self-pay | Admitting: Internal Medicine

## 2023-03-11 ENCOUNTER — Encounter: Payer: Self-pay | Admitting: Internal Medicine

## 2023-03-11 ENCOUNTER — Ambulatory Visit (INDEPENDENT_AMBULATORY_CARE_PROVIDER_SITE_OTHER): Payer: Medicare Other | Admitting: Internal Medicine

## 2023-03-11 VITALS — BP 130/88 | HR 91 | Temp 98.6°F | Ht 63.0 in

## 2023-03-11 DIAGNOSIS — I10 Essential (primary) hypertension: Secondary | ICD-10-CM | POA: Diagnosis not present

## 2023-03-11 DIAGNOSIS — E118 Type 2 diabetes mellitus with unspecified complications: Secondary | ICD-10-CM | POA: Diagnosis not present

## 2023-03-11 DIAGNOSIS — R109 Unspecified abdominal pain: Secondary | ICD-10-CM | POA: Diagnosis not present

## 2023-03-11 DIAGNOSIS — Z7984 Long term (current) use of oral hypoglycemic drugs: Secondary | ICD-10-CM | POA: Diagnosis not present

## 2023-03-11 LAB — URINALYSIS
Bilirubin Urine: NEGATIVE
Hgb urine dipstick: NEGATIVE
Ketones, ur: NEGATIVE
Leukocytes,Ua: NEGATIVE
Nitrite: NEGATIVE
Specific Gravity, Urine: 1.025 (ref 1.000–1.030)
Total Protein, Urine: NEGATIVE
Urine Glucose: NEGATIVE
Urobilinogen, UA: 0.2 (ref 0.0–1.0)
pH: 5.5 (ref 5.0–8.0)

## 2023-03-11 MED ORDER — KETOROLAC TROMETHAMINE 60 MG/2ML IM SOLN
60.0000 mg | Freq: Once | INTRAMUSCULAR | Status: AC
Start: 2023-03-11 — End: 2023-03-11
  Administered 2023-03-11: 60 mg via INTRAMUSCULAR

## 2023-03-11 MED ORDER — HYDROCODONE-ACETAMINOPHEN 5-325 MG PO TABS: 1.0000 | ORAL_TABLET | Freq: Four times a day (QID) | ORAL | 0 refills | Status: DC | PRN

## 2023-03-11 MED ORDER — VALACYCLOVIR HCL 1 G PO TABS: 1000.0000 mg | ORAL_TABLET | Freq: Three times a day (TID) | ORAL | 0 refills | Status: DC

## 2023-03-11 MED ORDER — CYCLOBENZAPRINE HCL 5 MG PO TABS: 5.0000 mg | ORAL_TABLET | Freq: Three times a day (TID) | ORAL | 1 refills | Status: DC | PRN

## 2023-03-11 MED ORDER — METHYLPREDNISOLONE 4 MG PO TBPK: ORAL_TABLET | ORAL | 0 refills | Status: DC

## 2023-03-11 NOTE — Progress Notes (Unsigned)
Subjective:  Patient ID: Patricia Elliott, female    DOB: 03-Oct-1956  Age: 67 y.o. MRN: [id number]  CC: Back Pain (Radiating to rt side of abdomen and rt leg, Pt only took tylenol at 9am and nothing since, pt states she has this happen 2-3 years ago.)   HPI Patricia Elliott presents for R Chest, abd and flank burning sensation since 3 am last night. The pain is severe - 9/10. No injury  Outpatient Medications Prior to Visit  Medication Sig Dispense Refill   acetaminophen (TYLENOL) 500 MG tablet Take 500 mg by mouth in the morning and at bedtime.     Calcitriol 3 MCG/GM cream Apply topically.     esomeprazole (NEXIUM) 20 MG capsule Take 20 mg by mouth daily at 12 noon.     fluocinonide (LIDEX) 0.05 % external solution      glucose blood test strip Use BID to test CBG's 100 each 3   JENTADUETO 2.02-999 MG TABS TAKE 1 TABLET IN THE MORNING AND AT BEDTIME 180 tablet 3   losartan-hydrochlorothiazide (HYZAAR) 50-12.5 MG tablet Take 1 tablet by mouth daily. 30 tablet 1   Multiple Vitamin (MULTIVITAMIN) tablet Take 1 tablet by mouth daily.     pravastatin (PRAVACHOL) 40 MG tablet TAKE 1 TABLET DAILY 90 tablet 3   Semaglutide (RYBELSUS) 7 MG TABS Take 7 mg by mouth daily. 90 tablet 3   No facility-administered medications prior to visit.    ROS: Review of Systems  Objective:  BP 130/88 (BP Location: Right Arm, Patient Position: Sitting, Cuff Size: Large)   Pulse 91   Temp 98.6 F (37 C) (Oral)   Ht 5\' 3"  (1.6 m)   SpO2 98%   BMI 29.76 kg/m   BP Readings from Last 3 Encounters:  03/11/23 130/88  12/09/22 120/70  06/07/22 118/76    Wt Readings from Last 3 Encounters:  12/09/22 168 lb (76.2 kg)  06/07/22 172 lb 3.2 oz (78.1 kg)  12/07/21 178 lb 9.6 oz (81 kg)    Physical Exam Antalgic gait LS - painful - R flank Lab Results  Component Value Date   WBC 5.1 12/09/2022   HGB 12.8 12/09/2022   HCT 38.7 12/09/2022   PLT 215.0 12/09/2022   GLUCOSE 117 (H) 12/09/2022   CHOL  183 12/09/2022   TRIG 84.0 12/09/2022   HDL 56.10 12/09/2022   LDLDIRECT 162.8 01/12/2011   LDLCALC 110 (H) 12/09/2022   ALT 15 12/09/2022   AST 14 12/09/2022   NA 141 12/09/2022   K 4.1 12/09/2022   CL 103 12/09/2022   CREATININE 0.75 12/09/2022   BUN 15 12/09/2022   CO2 29 12/09/2022   TSH 1.88 05/03/2014   HGBA1C 6.4 12/09/2022   MICROALBUR 1.1 12/09/2022    DG Bone Density  Result Date: 10/27/2022 EXAM: DUAL X-RAY ABSORPTIOMETRY (DXA) FOR BONE MINERAL DENSITY IMPRESSION: Referring Physician:  Austin Miles CRAWFORD Your patient completed a bone mineral density test using GE Lunar iDXA system (analysis version: 16). Technologist: lmn PATIENT: Name: Patricia Elliott, Patricia Elliott Patient ID: [id number] Birth Date: 1956-03-01 Height: 62.8 in. Sex: Female Measured: 10/27/2022 Weight: 166.8 lbs. Indications: Diabetic non insulin, Estrogen Deficient, Hysterectomy, Nexium, One ovary removed, Postmenopausal, Secondary Osteoporosis Fractures: NONE Treatments: Multivitamin ASSESSMENT: The BMD measured at Femur Neck Left is 0.816 g/cm2 with a T-score of -1.6. This patient is considered osteopenic/low bone mass according to World Health Organization [facility]) criteria. The quality of the exam is good. L3 and L4 were excluded due  to degenerative changes. Site Region Measured Date Measured Age YA BMD Significant CHANGE T-score DualFemur Neck Left  10[age]27/20[age]         -1.6    0.816 g/cm2 AP Spine  L1-L2      10[age]27/2022    67.9         -0.7    1.077 g/cm2 DualFemur Total Mean 10/27/2022    67.9         -0.2    0.985 g/cm2 World Health Organization [facility]) criteria for post-menopausal, Caucasian Women: Normal       T-score at or above -1 SD Osteopenia   T-score between -1 and -2.5 SD Osteoporosis T-score at or below -2.5 SD RECOMMENDATION: 1. All patients should optimize calcium and vitamin D intake. 2. Consider FDA-approved medical therapies in postmenopausal women and men aged 63 years and older, based on the following: a.  A hip or vertebral (clinical or morphometric) fracture. b. T-score = -2.5 at the femoral neck or spine after appropriate evaluation to exclude secondary causes. c. Low bone mass (T-score between -1.0 and -2.5 at the femoral neck or spine) and a 10-year probability of a hip fracture = 3% or a 10-year probability of a major osteoporosis-related fracture = 20% based on the [place]-adapted WHO algorithm. d. Clinician judgment and/or patient preferences may indicate treatment for people with 10-year fracture probabilities above or below these levels. FOLLOW-UP: Patients with diagnosis of osteoporosis or at high risk for fracture should have regular bone mineral density tests.? Patients eligible for Medicare are allowed routine testing every 2 years.? The testing frequency can be increased to one year for patients who have rapidly progressing disease, are receiving or discontinuing medical therapy to restore bone mass, or have additional risk factors. I have reviewed this study and agree with the findings. [facility] FRAX* 10-year Probability of Fracture Based on femoral neck BMD: DualFemur (Left) Major Osteoporotic Fracture: 4.3% Hip Fracture:                0.6% Population:                  [place] (Black) Risk Factors:                Secondary Osteoporosis *FRAX is a Armed forces logistics/support/administrative officer of the [employer] of [employer] for Metabolic Bone Disease, a World Science writer (WHO) [employer]. ASSESSMENT: The probability of a major osteoporotic fracture is 4.3% within the next ten years. The probability of a hip fracture is 0.6% within the next ten years. Electronically Signed   By: Romona Curls M.D.   On: 10/27/2022 09:05    Assessment & Plan:   Problem List Items Addressed This Visit     Flank pain - Primary    R lank pain  ?etiology Norco, Medrol pac, Flexeril Check UA Start Valtrex if rash MRI LS spine - reviewed RTC 1 wk      Relevant Orders   Urinalysis      Meds  ordered this encounter  Medications   HYDROcodone-acetaminophen (NORCO) 5-325 MG tablet    Sig: Take 1 tablet by mouth every 6 (six) hours as needed for moderate pain.    Dispense:  20 tablet    Refill:  0   cyclobenzaprine (FLEXERIL) 5 MG tablet    Sig: Take 1 tablet (5 mg total) by mouth 3 (three) times daily as needed for muscle spasms.    Dispense:  30 tablet  Refill:  1   methylPREDNISolone (MEDROL DOSEPAK) 4 MG TBPK tablet    Sig: As directed    Dispense:  21 tablet    Refill:  0   valACYclovir (VALTREX) 1000 MG tablet    Sig: Take 1 tablet (1,000 mg total) by mouth 3 (three) times daily.    Dispense:  21 tablet    Refill:  0      Follow-up: No follow-ups on file.  Sonda Primes, MD

## 2023-03-11 NOTE — Assessment & Plan Note (Signed)
R lank pain  ?etiology Norco, Medrol pac, Flexeril Check UA Start Valtrex if rash MRI LS spine - reviewed RTC 1 wk

## 2023-03-16 ENCOUNTER — Ambulatory Visit (INDEPENDENT_AMBULATORY_CARE_PROVIDER_SITE_OTHER): Payer: Medicare Other | Admitting: Internal Medicine

## 2023-03-16 ENCOUNTER — Encounter: Payer: Self-pay | Admitting: Internal Medicine

## 2023-03-16 VITALS — BP 160/100 | HR 86 | Temp 97.9°F | Ht 63.0 in | Wt 160.0 lb

## 2023-03-16 DIAGNOSIS — R109 Unspecified abdominal pain: Secondary | ICD-10-CM

## 2023-03-16 NOTE — Assessment & Plan Note (Signed)
Medrol pack may increase glucose readings.  The patient is aware.

## 2023-03-16 NOTE — Assessment & Plan Note (Signed)
Overall improving. Is still taking steroids and hydrocodone and flexeril. Since she is improving have asked her to finish steroids, start spacing out hydrocodone and flexeril as able and plan to wean off within 2-3 weeks. No imaging indicated today. If worsening or not improving further call in 1-2 weeks and we can do x-ray thoracic and lumbar.

## 2023-03-16 NOTE — Assessment & Plan Note (Signed)
BP Readings from Last 3 Encounters:  03/16/23 (!) 160/100  03/11/23 130/88  12/09/22 120/70  Blood pressure is probably elevated due to pain.  Will treat the underlying condition.

## 2023-03-16 NOTE — Progress Notes (Signed)
   Subjective:   Patient ID: Patricia Elliott, female    DOB: [birth year], 67 y.o.   MRN: [id number]  HPI The patient is a 67 YO female coming in for follow up right flank/back pain. Taking steroids and hydrocodone and flexeril and is about 70-80% better than last week.   Review of Systems  Constitutional:  Positive for activity change. Negative for appetite change, chills, fatigue, fever and unexpected weight change.  Respiratory: Negative.    Cardiovascular: Negative.   Gastrointestinal: Negative.   Musculoskeletal:  Positive for arthralgias, back pain and myalgias. Negative for gait problem and joint swelling.  Skin: Negative.   Neurological: Negative.     Objective:  Physical Exam Constitutional:      Appearance: She is well-developed.  HENT:     Head: Normocephalic and atraumatic.  Cardiovascular:     Rate and Rhythm: Normal rate and regular rhythm.  Pulmonary:     Effort: Pulmonary effort is normal. No respiratory distress.     Breath sounds: Normal breath sounds. No wheezing or rales.  Abdominal:     General: Bowel sounds are normal. There is no distension.     Palpations: Abdomen is soft.     Tenderness: There is no abdominal tenderness. There is no rebound.  Musculoskeletal:        General: Tenderness present.     Cervical back: Normal range of motion.  Skin:    General: Skin is warm and dry.  Neurological:     Mental Status: She is alert and oriented to person, place, and time.     Coordination: Coordination normal.     Vitals:   03/16/23 0839 03/16/23 0841  BP: (!) 160/100 (!) 160/100  Pulse: 86   Temp: 97.9 F (36.6 C)   TempSrc: Oral   SpO2: 96%   Weight: 160 lb (72.6 kg)   Height: 5\' 3"  (1.6 m)     Assessment & Plan:  Visit time 15 minutes in face to face communication with patient and coordination of care, additional 5 minutes spent in record review, coordination or care, ordering tests, communicating/referring to other healthcare professionals,  documenting in medical records all on the same day of the visit for total time 20 minutes spent on the visit.

## 2023-06-06 ENCOUNTER — Ambulatory Visit (INDEPENDENT_AMBULATORY_CARE_PROVIDER_SITE_OTHER): Payer: Medicare Other | Admitting: Internal Medicine

## 2023-06-06 ENCOUNTER — Encounter: Payer: Self-pay | Admitting: Internal Medicine

## 2023-06-06 VITALS — BP 134/78 | HR 70 | Temp 97.6°F | Ht 63.0 in | Wt 161.5 lb

## 2023-06-06 DIAGNOSIS — E1169 Type 2 diabetes mellitus with other specified complication: Secondary | ICD-10-CM | POA: Diagnosis not present

## 2023-06-06 DIAGNOSIS — K219 Gastro-esophageal reflux disease without esophagitis: Secondary | ICD-10-CM | POA: Diagnosis not present

## 2023-06-06 DIAGNOSIS — Z0000 Encounter for general adult medical examination without abnormal findings: Secondary | ICD-10-CM | POA: Diagnosis not present

## 2023-06-06 DIAGNOSIS — E785 Hyperlipidemia, unspecified: Secondary | ICD-10-CM | POA: Diagnosis not present

## 2023-06-06 DIAGNOSIS — E118 Type 2 diabetes mellitus with unspecified complications: Secondary | ICD-10-CM | POA: Diagnosis not present

## 2023-06-06 DIAGNOSIS — I10 Essential (primary) hypertension: Secondary | ICD-10-CM

## 2023-06-06 LAB — COMPREHENSIVE METABOLIC PANEL
ALT: 17 U/L (ref 0–35)
AST: 16 U/L (ref 0–37)
Albumin: 4.1 g/dL (ref 3.5–5.2)
Alkaline Phosphatase: 57 U/L (ref 39–117)
BUN: 14 mg/dL (ref 6–23)
CO2: 28 mEq/L (ref 19–32)
Calcium: 9.5 mg/dL (ref 8.4–10.5)
Chloride: 102 mEq/L (ref 96–112)
Creatinine, Ser: 0.67 mg/dL (ref 0.40–1.20)
GFR: 89.74 mL/min (ref >60.00)
Glucose, Bld: 125 mg/dL — ABNORMAL HIGH (ref 70–99)
Potassium: 3.6 mEq/L (ref 3.5–5.1)
Sodium: 136 mEq/L (ref 135–145)
Total Bilirubin: 0.3 mg/dL (ref 0.2–1.2)
Total Protein: 7.3 g/dL (ref 6.0–8.3)

## 2023-06-06 LAB — HEMOGLOBIN A1C: Hgb A1c MFr Bld: 6 % (ref 4.6–6.5)

## 2023-06-06 NOTE — Patient Instructions (Signed)
We will check the labs today and if the sugars are still doing well we will reduce the dose of your medicines.

## 2023-06-06 NOTE — Progress Notes (Unsigned)
Subjective:   Patient ID: Patricia Elliott, female    DOB: 1956-01-01, 67 y.o.   MRN: [id number]  HPI Here for medicare wellness, no new complaints. Please see A/P for status and treatment of chronic medical problems.   Diet: DM since diabetic Physical activity: active, walking Depression/mood screen: negative Hearing: intact to whispered voice Visual acuity: cloudy left eye, some reduction right eye, performs annual eye exam  ADLs: capable Fall risk: none Home safety: good Cognitive evaluation: intact to orientation, naming, recall and repetition EOL planning: adv directives discussed  Flowsheet Row Office Visit from 06/06/2023 in [facility] [place]  PHQ-2 Total Score 0       Flowsheet Row Office Visit from 12/09/2022 in [facility] [place]  PHQ-9 Total Score 0         06/07/2022    9:41 AM 12/09/2022    9:40 AM 03/11/2023    2:35 PM 03/16/2023    8:42 AM 06/06/2023    8:33 AM  Fall Risk  Falls in the past year? 0 0 0 0 0  Was there an injury with Fall? 0 0 0 0 0  Fall Risk Category Calculator 0 0 0 0 0  Fall Risk Category (Retired) Low      (RETIRED) Patient Fall Risk Level Low fall risk      Patient at Risk for Falls Due to No Fall Risks  No Fall Risks No Fall Risks No Fall Risks  Fall risk Follow up Falls evaluation completed Falls evaluation completed Falls evaluation completed  Falls evaluation completed    I have personally reviewed and have noted 1. The patient's medical and social history - reviewed today no changes 2. Their use of alcohol, tobacco or illicit drugs 3. Their current medications and supplements 4. The patient's functional ability including ADL's, fall risks, home safety risks and hearing or visual impairment. 5. Diet and physical activities 6. Evidence for depression or mood disorders 7. Care team reviewed and updated 8.  The patient is {} on an opioid pain medication and this was reviewed  with patient and non-opioid pain medication options were reviewed and offered to patient, their pain treatment plan and severity was discussed with them. We have considered referrals as appropriate for patient. Opioid risk factors were also considered and reviewed.   Patient Care Team: Myrlene Broker, MD as PCP - General (Internal Medicine) Dione Booze, Bertram Millard, MD as Consulting Physician (Ophthalmology) Past Medical History:  Diagnosis Date   Arthritis    Diabetes mellitus    GERD (gastroesophageal reflux disease)    Hyperlipidemia    Hypertension    Retinitis pigmentosa    Past Surgical History:  Procedure Laterality Date   ABDOMINAL HYSTERECTOMY     CESAREAN SECTION     Family History  Problem Relation Age of Onset   Hypertension Mother    Diabetes Father    Heart disease Father    Hypertension Sister    Diabetes Brother    Hypertension Brother    Diabetes Son    Breast cancer Other    Colon cancer Neg Hx    Stomach cancer Neg Hx      Review of Systems  Objective:  Physical Exam  Vitals:   06/06/23 0832  BP: 134/78  Pulse: 70  Temp: 97.6 F (36.4 C)  TempSrc: Temporal  SpO2: 94%  Weight: 161 lb 8 oz (73.3 kg)  Height: 5\' 3"  (1.6 m)  Assessment & Plan:

## 2023-06-07 ENCOUNTER — Other Ambulatory Visit: Payer: Self-pay | Admitting: Internal Medicine

## 2023-06-07 MED ORDER — JENTADUETO 2.5-1000 MG PO TABS: 1.0000 | ORAL_TABLET | Freq: Every day | ORAL | 3 refills | Status: DC

## 2023-06-08 NOTE — Assessment & Plan Note (Signed)
Taking nexium 20 mg daily and will continue refill as needed.

## 2023-06-08 NOTE — Assessment & Plan Note (Signed)
Lipid panel at goal most recent so not rechecked today. Keep pravastatin 40 mg daily.

## 2023-06-08 NOTE — Assessment & Plan Note (Signed)
Checking HgA1c and CMP. Previously at goal and if still stable will reduce jentadueto to 2.02/999 mg daily (instead of current 2 pills daily) and keep rybelsus 7 mg daily. Reminded about yearly eye exam.

## 2023-06-08 NOTE — Assessment & Plan Note (Signed)
BP is at goal on losartan/hydrochlorothiazide 50/12.5 mg daily. Checking CMP and adjust as needed.

## 2023-06-08 NOTE — Assessment & Plan Note (Signed)
Flu shot yearly. Pneumonia complete. Shingrix due at pharmacy. Tetanus due at pharmacy. Colonoscopy due 2028. Mammogram due 2024, pap smear aged out and dexa complete. Counseled about sun safety and mole surveillance. Counseled about the dangers of distracted driving. Given 10 year screening recommendations.

## 2023-07-04 ENCOUNTER — Other Ambulatory Visit: Payer: Self-pay | Admitting: Internal Medicine

## 2023-09-08 ENCOUNTER — Other Ambulatory Visit: Payer: Self-pay | Admitting: Internal Medicine

## 2023-09-08 DIAGNOSIS — Z1231 Encounter for screening mammogram for malignant neoplasm of breast: Secondary | ICD-10-CM

## 2023-09-29 DIAGNOSIS — E119 Type 2 diabetes mellitus without complications: Secondary | ICD-10-CM | POA: Diagnosis not present

## 2023-09-29 DIAGNOSIS — H3552 Pigmentary retinal dystrophy: Secondary | ICD-10-CM | POA: Diagnosis not present

## 2023-09-29 DIAGNOSIS — H26492 Other secondary cataract, left eye: Secondary | ICD-10-CM | POA: Diagnosis not present

## 2023-09-29 LAB — HM DIABETES EYE EXAM

## 2023-10-04 ENCOUNTER — Other Ambulatory Visit: Payer: Self-pay

## 2023-10-04 ENCOUNTER — Telehealth: Payer: Self-pay | Admitting: Internal Medicine

## 2023-10-04 MED ORDER — PRAVASTATIN SODIUM 40 MG PO TABS: 40.0000 mg | ORAL_TABLET | Freq: Every day | ORAL | 3 refills | Status: AC

## 2023-10-04 MED ORDER — LOSARTAN POTASSIUM-HCTZ 50-12.5 MG PO TABS: 1.0000 | ORAL_TABLET | Freq: Every day | ORAL | 1 refills | Status: DC

## 2023-10-04 MED ORDER — RYBELSUS 7 MG PO TABS: 1.0000 | ORAL_TABLET | Freq: Every day | ORAL | 3 refills | Status: DC

## 2023-10-04 NOTE — Telephone Encounter (Signed)
Refills has been sent in  

## 2023-10-04 NOTE — Telephone Encounter (Signed)
Please call pt about her medication I am not sure what she is exactly asking about her medication and something about the pharmacy.   pravastatin (PRAVACHOL) 40 MG tablet  RYBELSUS 7 MG TABS  losartan-hydrochlorothiazide (HYZAAR) 50-12.5 MG tablet

## 2023-10-10 ENCOUNTER — Ambulatory Visit
Admission: RE | Admit: 2023-10-10 | Discharge: 2023-10-10 | Disposition: A | Discharge status: Home / Self Care | Payer: Medicare Other | Source: Ambulatory Visit | Attending: Internal Medicine | Admitting: Internal Medicine

## 2023-10-10 DIAGNOSIS — Z1231 Encounter for screening mammogram for malignant neoplasm of breast: Secondary | ICD-10-CM | POA: Diagnosis not present

## 2023-12-08 ENCOUNTER — Ambulatory Visit: Payer: Medicare Other | Admitting: Internal Medicine

## 2023-12-08 ENCOUNTER — Encounter: Payer: Self-pay | Admitting: Internal Medicine

## 2023-12-08 VITALS — BP 128/84 | HR 78 | Temp 98.3°F | Ht 63.0 in | Wt 171.0 lb

## 2023-12-08 DIAGNOSIS — I10 Essential (primary) hypertension: Secondary | ICD-10-CM

## 2023-12-08 DIAGNOSIS — E1169 Type 2 diabetes mellitus with other specified complication: Secondary | ICD-10-CM

## 2023-12-08 DIAGNOSIS — Z7985 Long-term (current) use of injectable non-insulin antidiabetic drugs: Secondary | ICD-10-CM | POA: Diagnosis not present

## 2023-12-08 DIAGNOSIS — E118 Type 2 diabetes mellitus with unspecified complications: Secondary | ICD-10-CM | POA: Diagnosis not present

## 2023-12-08 DIAGNOSIS — Z7984 Long term (current) use of oral hypoglycemic drugs: Secondary | ICD-10-CM | POA: Diagnosis not present

## 2023-12-08 DIAGNOSIS — E785 Hyperlipidemia, unspecified: Secondary | ICD-10-CM | POA: Diagnosis not present

## 2023-12-08 DIAGNOSIS — I700 Atherosclerosis of aorta: Secondary | ICD-10-CM | POA: Diagnosis not present

## 2023-12-08 DIAGNOSIS — R109 Unspecified abdominal pain: Secondary | ICD-10-CM

## 2023-12-08 LAB — COMPREHENSIVE METABOLIC PANEL
ALT: 15 U/L (ref 0–35)
AST: 16 U/L (ref 0–37)
Albumin: 4.5 g/dL (ref 3.5–5.2)
Alkaline Phosphatase: 85 U/L (ref 39–117)
BUN: 15 mg/dL (ref 6–23)
CO2: 29 meq/L (ref 19–32)
Calcium: 10.1 mg/dL (ref 8.4–10.5)
Chloride: 101 meq/L (ref 96–112)
Creatinine, Ser: 0.81 mg/dL (ref 0.40–1.20)
GFR: 74.27 mL/min (ref >60.00)
Glucose, Bld: 139 mg/dL — ABNORMAL HIGH (ref 70–99)
Potassium: 4.1 meq/L (ref 3.5–5.1)
Sodium: 139 meq/L (ref 135–145)
Total Bilirubin: 0.3 mg/dL (ref 0.2–1.2)
Total Protein: 7.9 g/dL (ref 6.0–8.3)

## 2023-12-08 LAB — CBC
HCT: 39.4 % (ref 36.0–46.0)
Hemoglobin: 13 g/dL (ref 12.0–15.0)
MCHC: 32.9 g/dL (ref 30.0–36.0)
MCV: 92.1 fL (ref 78.0–100.0)
Platelets: 207 10*3/uL (ref 150.0–400.0)
RBC: 4.28 Mil/uL (ref 3.87–5.11)
RDW: 14.6 % (ref 11.5–15.5)
WBC: 4.8 10*3/uL (ref 4.0–10.5)

## 2023-12-08 LAB — URINALYSIS, ROUTINE W REFLEX MICROSCOPIC
Bilirubin Urine: NEGATIVE
Hgb urine dipstick: NEGATIVE
Ketones, ur: NEGATIVE
Leukocytes,Ua: NEGATIVE
Nitrite: NEGATIVE
RBC / HPF: NONE SEEN (ref 0–?)
Specific Gravity, Urine: 1.025 (ref 1.000–1.030)
Total Protein, Urine: NEGATIVE
Urine Glucose: NEGATIVE
Urobilinogen, UA: 0.2 (ref 0.0–1.0)
pH: 5.5 (ref 5.0–8.0)

## 2023-12-08 LAB — MICROALBUMIN / CREATININE URINE RATIO
Creatinine,U: 161.9 mg/dL
Microalb Creat Ratio: 7.8 mg/g (ref 0.0–30.0)
Microalb, Ur: 1.3 mg/dL (ref 0.0–1.9)

## 2023-12-08 LAB — LIPID PANEL
Cholesterol: 179 mg/dL (ref 0–200)
HDL: 58.1 mg/dL (ref >39.00)
LDL Cholesterol: 91 mg/dL (ref 0–99)
NonHDL: 120.41
Total CHOL/HDL Ratio: 3
Triglycerides: 147 mg/dL (ref 0.0–149.0)
VLDL: 29.4 mg/dL (ref 0.0–40.0)

## 2023-12-08 LAB — HEMOGLOBIN A1C: Hgb A1c MFr Bld: 6.6 % — ABNORMAL HIGH (ref 4.6–6.5)

## 2023-12-08 NOTE — Progress Notes (Unsigned)
   Subjective:   Patient ID: Patricia Elliott, female    DOB: [birth year], 68 y.o.   MRN: [id number]  HPI The patient is a 68 YO female coming in for follow up.   Review of Systems  Objective:  Physical Exam  Vitals:   12/08/23 0910  BP: 128/84  Pulse: 78  Temp: 98.3 F (36.8 C)  TempSrc: Oral  SpO2: 99%  Weight: 171 lb (77.6 kg)  Height: 5\' 3"  (1.6 m)    Assessment & Plan:

## 2023-12-08 NOTE — Patient Instructions (Signed)
We will check the labs today.

## 2023-12-09 NOTE — Assessment & Plan Note (Signed)
Checking lipid panel and adjust as needed pravastatin 40 mg daily goal LDL <70.

## 2023-12-09 NOTE — Assessment & Plan Note (Signed)
Checking lipid panel and adjust pravastatin 40 mg daily as needed.

## 2023-12-09 NOTE — Assessment & Plan Note (Signed)
Foot exam done and checking microalbumin to creatinine ratio and HgA1c and lipid panel. Adjust as needed jentadueto and rybelsus. On ARB and statin.

## 2023-12-09 NOTE — Assessment & Plan Note (Signed)
New problem for a few weeks and mild. Overall resolving. Checking U/A to rule out infection.

## 2023-12-09 NOTE — Assessment & Plan Note (Signed)
BP at goal checking CMP and adjust as needed losartan/hydrochlorothiazide 50/12.5 mg daily.

## 2023-12-12 ENCOUNTER — Encounter: Payer: Self-pay | Admitting: Internal Medicine

## 2023-12-16 ENCOUNTER — Telehealth: Payer: Self-pay | Admitting: Internal Medicine

## 2023-12-16 NOTE — Telephone Encounter (Signed)
Called patient and went over results there were no questions or concerns

## 2023-12-16 NOTE — Telephone Encounter (Signed)
Copied from CRM [id number]. Topic: Clinical - Lab/Test Results >> Dec 16, 2023  9:44 AM Sonny Dandy B wrote: Reason for CRM: pt called requesting lab results. Please call pt back at [phone]

## 2024-01-13 ENCOUNTER — Telehealth: Payer: Self-pay

## 2024-01-13 NOTE — Telephone Encounter (Signed)
 Patient called and advised her dosage is the same that depending on the manufacturer of the medication, the shapes/colors of pills may change. She verbalized understanding.  Copied from CRM [id number]. Topic: Clinical - Medication Question >> Jan 13, 2024  9:02 AM Sim Boast F wrote: Reason for CRM: Patient wants to know if the pravastatin 40mg  medication dosage has been changed? The med she received is a different color and shape, wants to know if anything has changed

## 2024-02-17 ENCOUNTER — Other Ambulatory Visit: Payer: Self-pay | Admitting: Internal Medicine

## 2024-02-17 NOTE — Telephone Encounter (Signed)
 Copied from CRM [id number]. Topic: Clinical - Medication Refill >> Feb 17, 2024  1:36 PM [place] E wrote: Most Recent Primary Care Visit:  Provider: Bambi Lever A  Department: [facility]  Visit Type: OFFICE VISIT  Date: 12/08/2023  Medication: losartan -hydrochlorothiazide  (HYZAAR) 50-12.5 MG tablet  Has the patient contacted their pharmacy? Yes (Agent: If no, request that the patient contact the pharmacy for the refill. If patient does not wish to contact the pharmacy document the reason why and proceed with request.) (Agent: If yes, when and what did the pharmacy advise?)  Is this the correct pharmacy for this prescription? Yes If no, delete pharmacy and type the correct one.  This is the patient's preferred pharmacy:  [facility] - [place], [place] - [address] [place] [address] Phone: [phone] Fax: [phone]   Has the prescription been filled recently? No  Is the patient out of the medication? No, has about a week left. Patient also requesting if she can get a 90-day supply at a time.  Has the patient been seen for an appointment in the last year OR does the patient have an upcoming appointment? Yes  Can we respond through MyChart? Yes  Agent: Please be advised that Rx refills may take up to 3 business days. We ask that you follow-up with your pharmacy.

## 2024-02-23 MED ORDER — LOSARTAN POTASSIUM-HCTZ 50-12.5 MG PO TABS: 1.0000 | ORAL_TABLET | Freq: Every day | ORAL | 1 refills | Status: DC

## 2024-03-23 ENCOUNTER — Other Ambulatory Visit: Payer: Self-pay

## 2024-03-23 ENCOUNTER — Other Ambulatory Visit: Payer: Self-pay | Admitting: Internal Medicine

## 2024-03-23 MED ORDER — LOSARTAN POTASSIUM-HCTZ 50-12.5 MG PO TABS: 1.0000 | ORAL_TABLET | Freq: Every day | ORAL | 0 refills | Status: DC

## 2024-03-23 NOTE — Telephone Encounter (Signed)
 Last Fill: 02/23/24  Last OV: 12/08/23 Next OV: 05/28/24  Routing to provider for review/authorization.

## 2024-03-23 NOTE — Telephone Encounter (Signed)
 Copied from CRM [id number]. Topic: Clinical - Medication Refill >> Mar 23, 2024  8:07 AM [place] E wrote: Medication: losartan -hydrochlorothiazide  (HYZAAR) 50-12.5 MG tablet  Has the patient contacted their pharmacy? Yes (Agent: If no, request that the patient contact the pharmacy for the refill. If patient does not wish to contact the pharmacy document the reason why and proceed with request.) (Agent: If yes, when and what did the pharmacy advise?)  This is the patient's preferred pharmacy:  [facility] - [place], [place] - [address] [place] [address] Phone: [phone] Fax: [phone]  Is this the correct pharmacy for this prescription? Yes If no, delete pharmacy and type the correct one.   Has the prescription been filled recently? Yes  Is the patient out of the medication? No, 5 days left. Patient is also requesting if she can get a 90 day supply instead of the 30 day supply.  Has the patient been seen for an appointment in the last year OR does the patient have an upcoming appointment? Yes  Can we respond through MyChart? Yes  Agent: Please be advised that Rx refills may take up to 3 business days. We ask that you follow-up with your pharmacy.

## 2024-03-29 ENCOUNTER — Ambulatory Visit: Payer: Self-pay

## 2024-03-29 NOTE — Telephone Encounter (Signed)
 FYI Only or Action Required?: FYI only for provider  Patient was last seen in primary care on 12/08/2023 by Adelia Homestead, MD. Called Nurse Triage reporting Neck Pain. Symptoms began several days ago. Interventions attempted: OTC medications: Extra Strength Tylenol . Symptoms are: neck pain radiating down left arm, left hand numbness(intermittent)rapidly worsening.  Triage Disposition: See Physician Within 24 Hours  Patient/caregiver understands and will follow disposition?: Yes                         Copied from CRM [id number]. Topic: Clinical - Red Word Triage >> Mar 29, 2024  8:45 AM Patricia Elliott wrote: Red Word that prompted transfer to Nurse Triage: Pain   Pt stated that she is experiencing pain in her left arm. Pt stated that it starts from her neck down to her elbow.  Pt stated that the pain level is a 5 right now but it gets to a 10 in the evenings. Pt would also like to schedule an appt to see provider regarding this concern. Reason for Disposition  Numbness in an arm or hand (i.e., loss of sensation)  Answer Assessment - Initial Assessment Questions 1. ONSET: "When did the pain begin?"      Sunday morning.  2. LOCATION: "Where does it hurt?"      Base of neck and crosses left shoulder blade and into left elbow.  3. PATTERN "Does the pain come and go, or has it been constant since it started?"      Constant, it improves during the day "eases off".  4. SEVERITY: "How bad is the pain?"  (Scale 1-10; or mild, moderate, severe)   - NO PAIN (0): no pain or only slight stiffness    - MILD (1-3): doesn't interfere with normal activities    - MODERATE (4-7): interferes with normal activities or awakens from sleep    - SEVERE (8-10):  excruciating pain, unable to do any normal activities      5/10 at present, severe at night when trying to go to bed.  5. RADIATION: "Does the pain go anywhere else, shoot into your arms?"     Left arm.  6. CORD SYMPTOMS: "Any  weakness or numbness of the arms or legs?"     Numbness in left hand sometimes; denies weakness in left arm or hand.  7. CAUSE: "What do you think is causing the neck pain?"     She states she thinks many years ago she had a pinched nerve. She also states she thinks the pain is related to cold air.  8. NECK OVERUSE: "Any recent activities that involved turning or twisting the neck?"     No.  9. OTHER SYMPTOMS: "Do you have any other symptoms?" (e.g., headache, fever, chest pain, difficulty breathing, neck swelling)     Wakes up sweating but states she doesn't think its a fever  10. PREGNANCY: "Is there any chance you are pregnant?" "When was your last menstrual period?"       N/A.  Patient denies neck stiffness, chest pain, SOB, headaches, neck swelling.  Protocols used: Neck Pain or Stiffness-A-AH

## 2024-03-30 ENCOUNTER — Encounter: Payer: Self-pay | Admitting: Internal Medicine

## 2024-03-30 ENCOUNTER — Ambulatory Visit (INDEPENDENT_AMBULATORY_CARE_PROVIDER_SITE_OTHER): Admitting: Internal Medicine

## 2024-03-30 VITALS — BP 126/80 | HR 76 | Temp 98.7°F | Ht 63.0 in | Wt 176.0 lb

## 2024-03-30 DIAGNOSIS — M79602 Pain in left arm: Secondary | ICD-10-CM | POA: Diagnosis not present

## 2024-03-30 MED ORDER — PREDNISONE 20 MG PO TABS: 40.0000 mg | ORAL_TABLET | Freq: Every day | ORAL | 0 refills | Status: DC

## 2024-03-30 MED ORDER — CYCLOBENZAPRINE HCL 5 MG PO TABS: 5.0000 mg | ORAL_TABLET | Freq: Three times a day (TID) | ORAL | 1 refills | Status: DC | PRN

## 2024-03-30 NOTE — Progress Notes (Signed)
   Subjective:   Patient ID: Patricia Elliott, female    DOB: December 30, 1955, 68 y.o.   MRN: [id number]  HPI The patient is a 68 YO female coming in for left shoulder and neck pain radiating down arm. No numbness maybe some weakness. Started Sunday and same since then. Pain constant worse at night. Better with holding arm up or dangling off bed. No old injuries to this side.   Review of Systems  Constitutional: Negative.   HENT: Negative.    Eyes: Negative.   Respiratory:  Negative for cough, chest tightness and shortness of breath.   Cardiovascular:  Negative for chest pain, palpitations and leg swelling.  Gastrointestinal:  Negative for abdominal distention, abdominal pain, constipation, diarrhea, nausea and vomiting.  Musculoskeletal:  Positive for arthralgias and myalgias.  Skin: Negative.   Neurological: Negative.   Psychiatric/Behavioral: Negative.      Objective:  Physical Exam Constitutional:      Appearance: She is well-developed.  HENT:     Head: Normocephalic and atraumatic.  Cardiovascular:     Rate and Rhythm: Normal rate and regular rhythm.  Pulmonary:     Effort: Pulmonary effort is normal. No respiratory distress.     Breath sounds: Normal breath sounds. No wheezing or rales.  Abdominal:     General: Bowel sounds are normal. There is no distension.     Palpations: Abdomen is soft.     Tenderness: There is no abdominal tenderness. There is no rebound.  Musculoskeletal:        General: Tenderness present.     Cervical back: Normal range of motion.     Comments: Tenderness at the left shoulder not much at the cervical spine or elbow.   Skin:    General: Skin is warm and dry.  Neurological:     Mental Status: She is alert and oriented to person, place, and time.     Coordination: Coordination normal.     Vitals:   03/30/24 1032  BP: 126/80  Pulse: 76  Temp: 98.7 F (37.1 C)  TempSrc: Oral  SpO2: 99%  Weight: 176 lb (79.8 kg)  Height: 5\' 3"  (1.6 m)     Assessment & Plan:

## 2024-03-30 NOTE — Patient Instructions (Signed)
We have sent in the prednisone to take 2 pills daily for 5 days.   

## 2024-03-30 NOTE — Assessment & Plan Note (Signed)
 Tender to touch in muscular region. No pain with activity. Present for 5 days without change. No numbness or weakness to suggest red flag. Rx prednisone  and flexeril  to help. If no improvement would get x-ray cervical and shoulder.

## 2024-05-28 ENCOUNTER — Ambulatory Visit (INDEPENDENT_AMBULATORY_CARE_PROVIDER_SITE_OTHER): Admitting: Internal Medicine

## 2024-05-28 ENCOUNTER — Telehealth: Payer: Self-pay

## 2024-05-28 ENCOUNTER — Encounter: Payer: Self-pay | Admitting: Internal Medicine

## 2024-05-28 ENCOUNTER — Ambulatory Visit: Payer: Self-pay | Admitting: Internal Medicine

## 2024-05-28 VITALS — BP 140/80 | HR 75 | Temp 97.9°F | Ht 63.0 in | Wt 177.0 lb

## 2024-05-28 DIAGNOSIS — M48062 Spinal stenosis, lumbar region with neurogenic claudication: Secondary | ICD-10-CM | POA: Diagnosis not present

## 2024-05-28 DIAGNOSIS — E118 Type 2 diabetes mellitus with unspecified complications: Secondary | ICD-10-CM

## 2024-05-28 DIAGNOSIS — R109 Unspecified abdominal pain: Secondary | ICD-10-CM

## 2024-05-28 DIAGNOSIS — E559 Vitamin D deficiency, unspecified: Secondary | ICD-10-CM

## 2024-05-28 DIAGNOSIS — R200 Anesthesia of skin: Secondary | ICD-10-CM | POA: Insufficient documentation

## 2024-05-28 DIAGNOSIS — M79602 Pain in left arm: Secondary | ICD-10-CM | POA: Diagnosis not present

## 2024-05-28 LAB — VITAMIN B12: Vitamin B-12: 527 pg/mL (ref 211–911)

## 2024-05-28 LAB — COMPREHENSIVE METABOLIC PANEL WITH GFR
ALT: 16 U/L (ref 0–35)
AST: 13 U/L (ref 0–37)
Albumin: 4.4 g/dL (ref 3.5–5.2)
Alkaline Phosphatase: 75 U/L (ref 39–117)
BUN: 15 mg/dL (ref 6–23)
CO2: 27 meq/L (ref 19–32)
Calcium: 9.8 mg/dL (ref 8.4–10.5)
Chloride: 100 meq/L (ref 96–112)
Creatinine, Ser: 0.75 mg/dL (ref 0.40–1.20)
GFR: 81.18 mL/min (ref >60.00)
Glucose, Bld: 186 mg/dL — ABNORMAL HIGH (ref 70–99)
Potassium: 3.8 meq/L (ref 3.5–5.1)
Sodium: 136 meq/L (ref 135–145)
Total Bilirubin: 0.3 mg/dL (ref 0.2–1.2)
Total Protein: 7.4 g/dL (ref 6.0–8.3)

## 2024-05-28 LAB — HEMOGLOBIN A1C: Hgb A1c MFr Bld: 8.1 % — ABNORMAL HIGH (ref 4.6–6.5)

## 2024-05-28 LAB — VITAMIN D 25 HYDROXY (VIT D DEFICIENCY, FRACTURES): VITD: 113.06 ng/mL (ref 30.00–100.00)

## 2024-05-28 MED ORDER — MELOXICAM 7.5 MG PO TABS: 7.5000 mg | ORAL_TABLET | Freq: Every day | ORAL | 0 refills | Status: DC

## 2024-05-28 NOTE — Assessment & Plan Note (Signed)
 Checking HgA1c and B12 adjust as needed jentadueto . On statin and arb.

## 2024-05-28 NOTE — Telephone Encounter (Signed)
Will address in result notes.

## 2024-05-28 NOTE — Assessment & Plan Note (Signed)
 Itching neuropathic sensation to right heel. Checking HgA1c for control, B12, Vitamin D , TSH. Adjust as needed. Suspect related to severe spinal stenosis.

## 2024-05-28 NOTE — Assessment & Plan Note (Signed)
 This did resolve with prednisone  and a few weeks.

## 2024-05-28 NOTE — Progress Notes (Signed)
 Subjective:   Patient ID: Patricia Elliott, female    DOB: 11-10-1955, 68 y.o.   MRN: [id number]  Discussed the use of AI scribe software for clinical note transcription with the patient, who gave verbal consent to proceed.  History of Present Illness Patricia Elliott is a 68 year old female with arthritis who presents with worsening right leg pain and weakness.  She experiences persistent pain in her right lower leg, which is more pronounced at night and causes stiffness upon waking. The pain is located in the muscle, not the knee, and has been a recurring issue. It is similar to previous episodes and is not alleviated by Tylenol , which she takes daily without relief.  She reports having had an MRI of her back a few years ago and was told she has arthritis in her back. She experiences weakness in the right leg, while the left leg remains unaffected. She has not had any recent falls but did accidentally roll out of bed while asleep, which she attributes to being in a deep sleep rather than leg weakness.  She describes a burning, itchy sensation in the ball of her right foot, which occurs unpredictably and is particularly bothersome at night. This sensation sometimes requires her to remove her shoe or sock for relief. She is unsure if this is related to her diabetes or back issues.  Her diabetes management includes monitoring blood sugar levels, which have recently shown higher morning readings, with the last high reading being around 190 mg/dL. Her A1c was previously 6.6. She takes vitamin D  and possibly vitamin B supplements.  She reports improvement in her left arm and neck pain, which resolved over a couple of weeks. No new chest pain, breathing issues, or gastrointestinal symptoms. She has noticed new skin patches but no significant changes in moles or lumps.  Review of Systems  Constitutional: Negative.   HENT: Negative.    Eyes: Negative.   Respiratory:  Negative for cough, chest  tightness and shortness of breath.   Cardiovascular:  Negative for chest pain, palpitations and leg swelling.  Gastrointestinal:  Negative for abdominal distention, abdominal pain, constipation, diarrhea, nausea and vomiting.  Musculoskeletal:  Positive for arthralgias and myalgias.  Skin: Negative.   Neurological:  Positive for numbness.  Psychiatric/Behavioral: Negative.      Objective:  Physical Exam Constitutional:      Appearance: She is well-developed.  HENT:     Head: Normocephalic and atraumatic.  Cardiovascular:     Rate and Rhythm: Normal rate and regular rhythm.  Pulmonary:     Effort: Pulmonary effort is normal. No respiratory distress.     Breath sounds: Normal breath sounds. No wheezing or rales.  Abdominal:     General: Bowel sounds are normal. There is no distension.     Palpations: Abdomen is soft.     Tenderness: There is no abdominal tenderness. There is no rebound.  Musculoskeletal:        General: Tenderness present.     Cervical back: Normal range of motion.  Skin:    General: Skin is warm and dry.  Neurological:     Mental Status: She is alert and oriented to person, place, and time.     Coordination: Coordination normal.     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 This is likely due to previous back issues involving arthritis and nerve compression. Tylenol  has been ineffective. Prescribe meloxicam  for pain management and use for one to two weeks to assess effectiveness. Check vitamin B12 and vitamin D  levels to rule out deficiencies contributing to neuropathic symptoms.  Type 2 diabetes mellitus   She has type  2 diabetes with elevated morning blood glucose readings and a previous A1c of 6.6%. Recheck blood glucose levels to monitor for changes in diabetes control.

## 2024-05-28 NOTE — Assessment & Plan Note (Signed)
 Overall stable and using tylenol . Suspect this is muscle and is not worsening. Rx meloxicam  7.5 mg daily to see if this helps.

## 2024-05-28 NOTE — Assessment & Plan Note (Addendum)
 This is overall stable but bothersome. She has right leg weakness and pain. Does not want to see specialist about this currently. Rx meloxicam  7.5 mg daily to see if this helps. Okay to continue tylenol  over the counter.

## 2024-05-28 NOTE — Patient Instructions (Addendum)
 We will check the labs today. We have sent in meloxicam  to help the leg pain to take daily.

## 2024-05-31 NOTE — Progress Notes (Signed)
 Called patient 1st attempt and LVM

## 2024-06-06 ENCOUNTER — Other Ambulatory Visit: Payer: Self-pay | Admitting: Internal Medicine

## 2024-06-07 ENCOUNTER — Ambulatory Visit (INDEPENDENT_AMBULATORY_CARE_PROVIDER_SITE_OTHER)

## 2024-06-07 VITALS — Ht 63.0 in | Wt 177.0 lb

## 2024-06-07 DIAGNOSIS — Z0000 Encounter for general adult medical examination without abnormal findings: Secondary | ICD-10-CM | POA: Diagnosis not present

## 2024-06-07 NOTE — Patient Instructions (Addendum)
 Patricia Elliott , Thank you for taking time out of your busy schedule to complete your Annual Wellness Visit with me. I enjoyed our conversation and look forward to speaking with you again next year. I, as well as your care team,  appreciate your ongoing commitment to your health goals. Please review the following plan we discussed and let me know if I can assist you in the future. Your Game plan/ To Do List    Referrals: If you haven't heard from the office you've been referred to, please reach out to them at the phone provided.   Follow up Visits: We will see or speak with you next year for your Next Medicare AWV with our clinical staff Have you seen your provider in the last 6 months (3 months if uncontrolled diabetes)? No  Clinician Recommendations:  Aim for 30 minutes of exercise or brisk walking, 6-8 glasses of water, and 5 servings of fruits and vegetables each day. Educated and advised on getting the Shingles and Tdap vaccines in 2025.      This is a list of the screenings recommended for you:  Health Maintenance  Topic Date Due   Zoster (Shingles) Vaccine (1 of 2) Never done   DTaP/Tdap/Td vaccine (2 - Tdap) 10/25/2016   COVID-19 Vaccine (6 - 2024-25 season) 02/08/2024   Flu Shot  05/25/2024   Eye exam for diabetics  09/28/2024   Mammogram  10/09/2024   Hemoglobin A1C  11/28/2024   Yearly kidney health urinalysis for diabetes  12/07/2024   Complete foot exam   12/07/2024   Yearly kidney function blood test for diabetes  05/28/2025   Medicare Annual Wellness Visit  06/07/2025   Colon Cancer Screening  05/11/2027   Pneumococcal Vaccine for age over 31  Completed   DEXA scan (bone density measurement)  Completed   Hepatitis C Screening  Completed   HPV Vaccine  Aged Out   Meningitis B Vaccine  Aged Out   Pneumococcal Vaccine  Discontinued    Advanced directives: (Copy Requested) Please bring a copy of your health care power of attorney and living will to the office to be added to  your chart at your convenience. You can mail to [facility] [address] [place], [place] [address] or email to [email or web address] Advance Care Planning is important because it:  [x]  Makes sure you receive the medical care that is consistent with your values, goals, and preferences  [x]  It provides guidance to your family and loved ones and reduces their decisional burden about whether or not they are making the right decisions based on your wishes.  Follow the link provided in your after visit summary or read over the paperwork we have mailed to you to help you started getting your Advance Directives in place. If you need assistance in completing these, please reach out to us  so that we can help you!

## 2024-06-07 NOTE — Telephone Encounter (Signed)
 Copied from CRM [id number]. Topic: Clinical - Lab/Test Results >> Jun 07, 2024  9:46 AM Patricia Elliott wrote: Reason for CRM: Patient would like a call back to go over her lab results please.  Patient callback is [phone]

## 2024-06-07 NOTE — Progress Notes (Addendum)
 Subjective:  Please attest and cosign this visit due to patients primary care provider not being in the office at the time the visit was completed.  (Pt of Dr Almarie Cleveland)   Patricia Elliott is a 68 y.o. who presents for a Medicare Wellness preventive visit.  As a reminder, Annual Wellness Visits don't include a physical exam, and some assessments may be limited, especially if this visit is performed virtually. We may recommend an in-person follow-up visit with your provider if needed.  Visit Complete: Virtual I connected with  Patricia Elliott on 06/07/24 by a audio enabled telemedicine application and verified that I am speaking with the correct person using two identifiers.  Patient Location: Home  Provider Location: Office/Clinic  I discussed the limitations of evaluation and management by telemedicine. The patient expressed understanding and agreed to proceed.  Vital Signs: Because this visit was a virtual/telehealth visit, some criteria may be missing or patient reported. Any vitals not documented were not able to be obtained and vitals that have been documented are patient reported.  VideoDeclined- This patient declined Librarian, academic. Therefore the visit was completed with audio only.  Persons Participating in Visit: Patient.  AWV Questionnaire: No: Patient Medicare AWV questionnaire was not completed prior to this visit.  Cardiac Risk Factors include: advanced age (>81men, >29 women);diabetes mellitus;dyslipidemia;hypertension;obesity (BMI >30kg/m2)     Objective:    Today's Vitals   06/07/24 1053  Weight: 177 lb (80.3 kg)  Height: 5' 3 (1.6 m)   Body mass index is 31.35 kg/m.     06/07/2024   10:52 AM 04/30/2022   11:16 AM 04/07/2021    9:51 AM 12/19/2018    8:26 AM 12/02/2017   12:13 PM  Advanced Directives  Does Patient Have a Medical Advance Directive? Yes Yes Yes Yes  Yes   Type of Estate agent of  [place];Living will Healthcare Power of [place];Living will Healthcare Power of [place];Living will Healthcare Power of [place];Living will Healthcare Power of [place];Living will  Does patient want to make changes to medical advance directive?   No - Patient declined    Copy of Healthcare Power of Attorney in Chart? No - copy requested No - copy requested No - copy requested No - copy requested  No - copy requested      Data saved with a previous flowsheet row definition    Current Medications (verified) Outpatient Encounter Medications as of 06/07/2024  Medication Sig   acetaminophen  (TYLENOL ) 500 MG tablet Take 500 mg by mouth in the morning and at bedtime.   cyclobenzaprine  (FLEXERIL ) 5 MG tablet Take 1 tablet (5 mg total) by mouth 3 (three) times daily as needed for muscle spasms.   esomeprazole (NEXIUM) 20 MG capsule Take 20 mg by mouth daily at 12 noon.   glucose blood test strip Use BID to test CBG's   linaGLIPtin -metFORMIN  HCl (JENTADUETO ) 2.02-999 MG TABS Take 1 tablet by mouth daily.   losartan -hydrochlorothiazide  (HYZAAR) 50-12.5 MG tablet Take 1 tablet by mouth daily.   meloxicam  (MOBIC ) 7.5 MG tablet Take 1 tablet (7.5 mg total) by mouth daily.   Multiple Vitamin (MULTIVITAMIN) tablet Take 1 tablet by mouth daily.   pravastatin  (PRAVACHOL ) 40 MG tablet Take 1 tablet (40 mg total) by mouth daily.   Semaglutide  (RYBELSUS ) 7 MG TABS Take 1 tablet (7 mg total) by mouth daily.   No facility-administered encounter medications on file as of 06/07/2024.    Allergies (verified) Patient has  no known allergies.   History: Past Medical History:  Diagnosis Date   Arthritis    Diabetes mellitus    GERD (gastroesophageal reflux disease)    Hyperlipidemia    Hypertension    Retinitis pigmentosa    Past Surgical History:  Procedure Laterality Date   ABDOMINAL HYSTERECTOMY     CESAREAN SECTION     Family History  Problem Relation Age of Onset   Hypertension Mother     Diabetes Father    Heart disease Father    Hypertension Sister    Diabetes Brother    Hypertension Brother    Diabetes Son    Breast cancer Other    Colon cancer Neg Hx    Stomach cancer Neg Hx    Social History   Socioeconomic History   Marital status: Married    Spouse name: Not on file   Number of children: 1   Years of education: Not on file   Highest education level: Not on file  Occupational History   Not on file  Tobacco Use   Smoking status: Never   Smokeless tobacco: Never  Vaping Use   Vaping status: Never Used  Substance and Sexual Activity   Alcohol use: No   Drug use: No   Sexual activity: Yes    Partners: Male  Other Topics Concern   Not on file  Social History Narrative   Not on file   Social Drivers of Health   Financial Resource Strain: Low Risk  (06/07/2024)   Overall Financial Resource Strain (CARDIA)    Difficulty of Paying Living Expenses: Not hard at all  Food Insecurity: No Food Insecurity (06/07/2024)   Hunger Vital Sign    Worried About Running Out of Food in the Last Year: Never true    Ran Out of Food in the Last Year: Never true  Transportation Needs: No Transportation Needs (06/07/2024)   PRAPARE - Administrator, Civil Service (Medical): No    Lack of Transportation (Non-Medical): No  Physical Activity: Inactive (06/07/2024)   Exercise Vital Sign    Days of Exercise per Week: 0 days    Minutes of Exercise per Session: 0 min  Stress: No Stress Concern Present (06/07/2024)   [employer] of Occupational Health - Occupational Stress Questionnaire    Feeling of Stress: Not at all  Social Connections: Moderately Integrated (06/07/2024)   Social Connection and Isolation Panel    Frequency of Communication with Friends and Family: Three times a week    Frequency of Social Gatherings with Friends and Family: Three times a week    Attends Religious Services: More than 4 times per year    Active Member of Clubs or  Organizations: No    Attends Banker Meetings: Never    Marital Status: Married    Tobacco Counseling Counseling given: No    Clinical Intake:  Pre-visit preparation completed: Yes  Pain : No/denies pain     BMI - recorded: 31.35 Nutritional Status: BMI > 30  Obese Nutritional Risks: None Diabetes: Yes CBG done?: No Did pt. bring in CBG monitor from home?: No  Lab Results  Component Value Date   HGBA1C 8.1 (H) 05/28/2024   HGBA1C 6.6 (H) 12/08/2023   HGBA1C 6.0 06/06/2023     How often do you need to have someone help you when you read instructions, pamphlets, or other written materials from your doctor or pharmacy?: 1 - Never  Interpreter Needed?: No  Information  entered by :: Verdie Saba, CMA   Activities of Daily Living     06/07/2024   10:56 AM  In your present state of health, do you have any difficulty performing the following activities:  Hearing? 0  Vision? 0  Difficulty concentrating or making decisions? 0  Walking or climbing stairs? 0  Dressing or bathing? 0  Doing errands, shopping? 0  Preparing Food and eating ? N  Using the Toilet? N  In the past six months, have you accidently leaked urine? N  Do you have problems with loss of bowel control? N  Managing your Medications? N  Managing your Finances? N  Housekeeping or managing your Housekeeping? N    Patient Care Team: Rollene Almarie LABOR, MD as PCP - General (Internal Medicine) Octavia, Charlie Hamilton, MD as Consulting Physician (Ophthalmology)  I have updated your Care Teams any recent Medical Services you may have received from other providers in the past year.     Assessment:   This is a routine wellness examination for Patricia Elliott.  Hearing/Vision screen Hearing Screening - Comments:: Denies hearing difficulties   Vision Screening - Comments:: Wears rx glasses - up to date with routine eye exams with [facility]   Goals Addressed               This Visit's  Progress     Patient Stated (pt-stated)        Patient stated she plans to continue to watch her food intake and Diabetes' levels       Depression Screen     06/07/2024   10:57 AM 06/06/2023    8:33 AM 03/11/2023    2:35 PM 12/09/2022    9:40 AM 06/07/2022    9:41 AM 04/30/2022   11:17 AM 04/30/2022   11:15 AM  PHQ 2/9 Scores  PHQ - 2 Score 0 0 0 0 0 0 0  PHQ- 9 Score 0   0 0      Fall Risk     06/07/2024   10:56 AM 03/30/2024   10:41 AM 12/08/2023    9:12 AM 06/06/2023    8:33 AM 03/16/2023    8:42 AM  Fall Risk   Falls in the past year? 0 0 0 0 0  Number falls in past yr: 0 0 0 0 0  Injury with Fall? 0 0 0 0 0  Risk for fall due to : No Fall Risks   No Fall Risks No Fall Risks  Follow up Falls evaluation completed;Falls prevention discussed Falls evaluation completed Falls evaluation completed Falls evaluation completed     MEDICARE RISK AT HOME:  Medicare Risk at Home Any stairs in or around the home?: Yes (outside) If so, are there any without handrails?: No Home free of loose throw rugs in walkways, pet beds, electrical cords, etc?: Yes Adequate lighting in your home to reduce risk of falls?: Yes Life alert?: No Use of a cane, walker or w/c?: No Grab bars in the bathroom?: Yes Shower chair or bench in shower?: No Elevated toilet seat or a handicapped toilet?: Yes  TIMED UP AND GO:  Was the test performed?  No  Cognitive Function: 6CIT completed        06/07/2024   10:59 AM  6CIT Screen  What Year? 0 points  What month? 0 points  What time? 0 points  Count back from 20 0 points  Months in reverse 0 points  Repeat phrase 0 points  Total Score  0 points    Immunizations Immunization History  Administered Date(s) Administered   Fluad Quad(high Dose 65+) 12/07/2021   Fluad Trivalent(High Dose 65+) 08/10/2023   Influenza Whole 10/25/2005   Influenza,inj,Quad PF,6+ Mos 12/06/2017, 12/19/2018   Influenza-Unspecified 08/25/2020   PFIZER(Purple Top)SARS-COV-2  Vaccination 02/04/2020, 02/25/2020, 09/16/2020, 01/08/2022   PNEUMOCOCCAL CONJUGATE-20 12/07/2021   Td 10/25/2006   Unspecified SARS-COV-2 Vaccination 08/10/2023    Screening Tests Health Maintenance  Topic Date Due   Zoster Vaccines- Shingrix (1 of 2) Never done   DTaP/Tdap/Td (2 - Tdap) 10/25/2016   COVID-19 Vaccine (6 - 2024-25 season) 02/08/2024   INFLUENZA VACCINE  05/25/2024   OPHTHALMOLOGY EXAM  09/28/2024   MAMMOGRAM  10/09/2024   HEMOGLOBIN A1C  11/28/2024   Diabetic kidney evaluation - Urine ACR  12/07/2024   FOOT EXAM  12/07/2024   Diabetic kidney evaluation - eGFR measurement  05/28/2025   Medicare Annual Wellness (AWV)  06/07/2025   Colonoscopy  05/11/2027   Pneumococcal Vaccine: 50+ Years  Completed   DEXA SCAN  Completed   Hepatitis C Screening  Completed   HPV VACCINES  Aged Out   Meningococcal B Vaccine  Aged Out   Pneumococcal Vaccine  Discontinued    Health Maintenance  Health Maintenance Due  Topic Date Due   Zoster Vaccines- Shingrix (1 of 2) Never done   DTaP/Tdap/Td (2 - Tdap) 10/25/2016   COVID-19 Vaccine (6 - 2024-25 season) 02/08/2024   INFLUENZA VACCINE  05/25/2024   Health Maintenance Items Addressed:06/07/2024   Additional Screening:  Vision Screening: Recommended annual ophthalmology exams for early detection of glaucoma and other disorders of the eye. Would you like a referral to an eye doctor? No    Dental Screening: Recommended annual dental exams for proper oral hygiene  Community Resource Referral / Chronic Care Management: CRR required this visit?  No   CCM required this visit?  No   Plan:    I have personally reviewed and noted the following in the patient's chart:   Medical and social history Use of alcohol, tobacco or illicit drugs  Current medications and supplements including opioid prescriptions. Patient is not currently taking opioid prescriptions. Functional ability and status Nutritional status Physical  activity Advanced directives List of other physicians Hospitalizations, surgeries, and ER visits in previous 12 months Vitals Screenings to include cognitive, depression, and falls Referrals and appointments  In addition, I have reviewed and discussed with patient certain preventive protocols, quality metrics, and best practice recommendations. A written personalized care plan for preventive services as well as general preventive health recommendations were provided to patient.   Verdie CHRISTELLA Saba, CMA   06/07/2024   After Visit Summary: (MyChart) Due to this being a telephonic visit, the after visit summary with patients personalized plan was offered to patient via MyChart   Notes: Scheduled a 6-mth Diabetes f/u appt w/PCP for 11/2024.  Medical screening examination/treatment/procedure(s) were performed by non-physician practitioner and as supervising physician I was immediately available for consultation/collaboration.  I agree with above. Karlynn Noel, MD

## 2024-06-08 NOTE — Progress Notes (Signed)
 2nd attempt calling patient unable to lvm this time phone keep ringing

## 2024-06-22 ENCOUNTER — Telehealth: Payer: Self-pay

## 2024-06-22 NOTE — Telephone Encounter (Signed)
 Patient was identified as falling into the [place] Measure - Diabetes.   Patient was: Attribution and/or data issue.  Validation/Investigation needed.  Explanation:  Patient had a recent A1c check 08/04. Next follow up with PCP is in November

## 2024-06-25 ENCOUNTER — Other Ambulatory Visit: Payer: Self-pay | Admitting: Internal Medicine

## 2024-08-28 ENCOUNTER — Other Ambulatory Visit: Payer: Self-pay | Admitting: Internal Medicine

## 2024-08-28 ENCOUNTER — Telehealth: Payer: Self-pay | Admitting: Internal Medicine

## 2024-08-28 ENCOUNTER — Ambulatory Visit: Admitting: Internal Medicine

## 2024-08-28 ENCOUNTER — Encounter: Payer: Self-pay | Admitting: Internal Medicine

## 2024-08-28 VITALS — BP 120/78 | HR 67 | Temp 98.8°F | Ht 63.0 in | Wt 177.0 lb

## 2024-08-28 DIAGNOSIS — M48062 Spinal stenosis, lumbar region with neurogenic claudication: Secondary | ICD-10-CM | POA: Diagnosis not present

## 2024-08-28 DIAGNOSIS — E118 Type 2 diabetes mellitus with unspecified complications: Secondary | ICD-10-CM | POA: Diagnosis not present

## 2024-08-28 DIAGNOSIS — R10A2 Flank pain, left side: Secondary | ICD-10-CM | POA: Diagnosis not present

## 2024-08-28 LAB — POCT GLYCOSYLATED HEMOGLOBIN (HGB A1C): Hemoglobin A1C: 7.7 % — AB (ref 4.0–5.6)

## 2024-08-28 MED ORDER — PREGABALIN 75 MG PO CAPS: 75.0000 mg | ORAL_CAPSULE | Freq: Two times a day (BID) | ORAL | 0 refills | Status: AC

## 2024-08-28 MED ORDER — JENTADUETO 2.5-1000 MG PO TABS: 1.0000 | ORAL_TABLET | Freq: Every day | ORAL | 3 refills | Status: AC

## 2024-08-28 NOTE — Progress Notes (Signed)
 "  Subjective:   Patient ID: Patricia Elliott, female    DOB: [birth year], 68 y.o.   MRN: [id number]  Discussed the use of AI scribe software for clinical note transcription with the patient, who gave verbal consent to proceed.  History of Present Illness Patricia Elliott is a 68 year old female with chronic pain and diabetes who presents with persistent leg pain and new left-sided pain.  She experiences persistent pain in her leg, specifically down the front rather than in the knee area. This pain occurs daily and has not been relieved by previous treatments, including Tylenol , muscle relaxers, meloxicam , and gabapentin , leading her to discontinue these medications.  She describes a new pain on the left side, characterized as 'weird' and 'prickly'. This pain has been present for some time, but its origin is unclear. She recalls having a CT scan of her abdomen in the past, which she was told looked good at the time, and MRIs of her back, with her understanding that the MRI in 2022 showed changes compared to 2013.  She experienced a cramp or spasm in her hand that resolved with rubbing, followed by a sharp pain in her fingers when using her hand. No new swelling in her feet or ankles is noted.  Her diabetes management is ongoing, with a recent A1c of 7.0, an improvement from previous levels. She suspects her glucose monitor may be giving high readings.  She reports seeing 'purple tear drops' floating in her vision. She also experienced a brief episode of lightheadedness a few weeks ago, which she attributes to a possible environmental factor.  Review of Systems  Constitutional: Negative.   HENT: Negative.    Eyes: Negative.   Respiratory:  Negative for cough, chest tightness and shortness of breath.   Cardiovascular:  Negative for chest pain, palpitations and leg swelling.  Gastrointestinal:  Negative for abdominal distention, abdominal pain, constipation, diarrhea, nausea and vomiting.   Musculoskeletal:  Positive for arthralgias, back pain and myalgias.  Skin: Negative.   Neurological: Negative.   Psychiatric/Behavioral: Negative.      Objective:  Physical Exam Constitutional:      Appearance: She is well-developed.  HENT:     Head: Normocephalic and atraumatic.  Cardiovascular:     Rate and Rhythm: Normal rate and regular rhythm.  Pulmonary:     Effort: Pulmonary effort is normal. No respiratory distress.     Breath sounds: Normal breath sounds. No wheezing or rales.  Abdominal:     General: Bowel sounds are normal. There is no distension.     Palpations: Abdomen is soft.     Tenderness: There is no abdominal tenderness.  Musculoskeletal:        General: Tenderness present.     Cervical back: Normal range of motion.  Skin:    General: Skin is warm and dry.  Neurological:     Mental Status: She is alert and oriented to person, place, and time.     Coordination: Coordination normal.     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 Prescribe Lyrica  for one month to assess its efficacy for nerve pain.  Chronic low back pain with lumbar spinal stenosis and osteoarthritis   Chronic low back pain with lumbar spinal stenosis and osteoarthritis has worsened since 2013 and may contribute to radicular symptoms in the leg. Continue current management and monitor response to Lyrica  for relief of radicular symptoms.  Left lower abdominal pain, possible radiculopathy   New left lower abdominal pain may be due to radiculopathy or another abdominal pathology. A previous CT scan was normal. Consider a CT scan of the abdomen and pelvis to evaluate potential  abdominal causes.  Type 2 diabetes mellitus   Recent A1c is 7.7, showing improvement. There may be inaccuracies with the home glucose monitor, but current management is effective. Continue the current diabetes management regimen and consider obtaining a new glucose monitor.   "

## 2024-08-28 NOTE — Patient Instructions (Addendum)
 Your sugars are doing better today the HgA1c is 7.7 which is at goal.  We have sent in lyrica  to take 1 pill twice a day to see if this helps with the back pain.

## 2024-08-31 NOTE — Assessment & Plan Note (Signed)
 New left lower abdominal pain may be due to radiculopathy or another abdominal pathology. A previous CT scan was normal. Consider a CT scan of the abdomen and pelvis to evaluate potential abdominal causes.

## 2024-08-31 NOTE — Assessment & Plan Note (Signed)
 Chronic low back pain with lumbar spinal stenosis and osteoarthritis has worsened since 2013 and may contribute to radicular symptoms in the leg. Continue current management and monitor response to Lyrica  for relief of radicular symptoms.

## 2024-08-31 NOTE — Assessment & Plan Note (Signed)
 Recent A1c is 7.7, showing improvement. There may be inaccuracies with the home glucose monitor, but current management is effective. Continue the current diabetes management regimen and consider obtaining a new glucose monitor.

## 2024-09-10 ENCOUNTER — Encounter: Payer: Self-pay | Admitting: Internal Medicine

## 2024-09-10 DIAGNOSIS — Z1231 Encounter for screening mammogram for malignant neoplasm of breast: Secondary | ICD-10-CM

## 2024-09-11 ENCOUNTER — Ambulatory Visit: Payer: Self-pay

## 2024-09-11 NOTE — Telephone Encounter (Signed)
 1st attempt to call patient. Call was answered but there was no response. Routing for additional attempts.     Copied from CRM [id number]. Topic: Clinical - Medical Advice >> Sep 11, 2024  1:39 PM Rea C wrote: Reason for CRM: Patient has question for a nurse and she would like to wait for nurse callback to ask.  [phone] (H)

## 2024-09-11 NOTE — Telephone Encounter (Signed)
 This RN made second attempt to triage patient. No answer, unable to LVM. Routing for additional attempts.

## 2024-09-11 NOTE — Telephone Encounter (Signed)
 NT has made 3 attempts to call the patient without success. Unable to leave a message. Routing to clinic.

## 2024-09-23 ENCOUNTER — Other Ambulatory Visit: Payer: Self-pay | Admitting: Internal Medicine

## 2024-10-01 ENCOUNTER — Ambulatory Visit: Admitting: Internal Medicine

## 2024-10-01 ENCOUNTER — Encounter: Payer: Self-pay | Admitting: Internal Medicine

## 2024-10-01 VITALS — BP 130/70 | HR 61 | Temp 98.6°F | Ht 63.0 in | Wt 177.2 lb

## 2024-10-01 DIAGNOSIS — Z1231 Encounter for screening mammogram for malignant neoplasm of breast: Secondary | ICD-10-CM | POA: Diagnosis not present

## 2024-10-01 DIAGNOSIS — M25552 Pain in left hip: Secondary | ICD-10-CM | POA: Diagnosis not present

## 2024-10-01 DIAGNOSIS — D229 Melanocytic nevi, unspecified: Secondary | ICD-10-CM | POA: Insufficient documentation

## 2024-10-01 NOTE — Assessment & Plan Note (Signed)
 The new mole on the right breast is likely a benign skin mole. Proceed with the planned screening mammogram.

## 2024-10-01 NOTE — Progress Notes (Signed)
   Subjective:   Patient ID: Patricia Elliott, female    DOB: September 13, 1956, 68 y.o.   MRN: [id number]  Discussed the use of AI scribe software for clinical note transcription with the patient, who gave verbal consent to proceed.  History of Present Illness Patricia Elliott is a 68 year old female who presents with a new mole on her right breast.  Approximately two months ago, she noticed a new mole on her right breast. The mole has remained unchanged in size and appearance since its discovery. It feels similar to another mole she has, but it looks different. She has no associated pain, breast soreness, tenderness, or nipple discharge, and no lumps or bumps are felt in the breast.  She experiences new pain in her hip that began the morning after her last visit. The pain is accompanied by a loud cracking sound and occurs with certain movements. She has had extensive imaging on her back but not on her hip.  Review of Systems  Constitutional: Negative.   HENT: Negative.    Eyes: Negative.   Respiratory:  Negative for cough, chest tightness and shortness of breath.   Cardiovascular:  Negative for chest pain, palpitations and leg swelling.  Gastrointestinal:  Negative for abdominal distention, abdominal pain, constipation, diarrhea, nausea and vomiting.  Musculoskeletal:  Positive for arthralgias.  Skin: Negative.        New mole  Neurological: Negative.   Psychiatric/Behavioral: Negative.      Objective:  Physical Exam Constitutional:      Appearance: She is well-developed.  HENT:     Head: Normocephalic and atraumatic.  Cardiovascular:     Rate and Rhythm: Normal rate and regular rhythm.     Comments: Right breast exam normal without suspicious mass, skin lesion. The new mole is benign in appearance Pulmonary:     Effort: Pulmonary effort is normal. No respiratory distress.     Breath sounds: Normal breath sounds. No wheezing or rales.  Abdominal:     General: Bowel sounds are  normal. There is no distension.     Palpations: Abdomen is soft.     Tenderness: There is no abdominal tenderness.  Musculoskeletal:        General: Tenderness present.     Cervical back: Normal range of motion.  Skin:    General: Skin is warm and dry.  Neurological:     Mental Status: She is alert and oriented to person, place, and time.     Coordination: Coordination normal.     Vitals:   10/01/24 0911  BP: 130/70  Pulse: 61  Temp: 98.6 F (37 C)  TempSrc: Oral  SpO2: 99%  Weight: 177 lb 3.2 oz (80.4 kg)  Height: 5' 3 (1.6 m)    Assessment and Plan Assessment & Plan Encounter for screening mammogram for malignant neoplasm of breast   A new right breast mole is identified, likely a skin mole rather than a neoplasm. A screening mammogram is appropriate. Ordered a screening mammogram with a note for new mole examination, no diagnostic mammogram needed.  Evaluation of new right breast mole   The new mole on the right breast is likely a benign skin mole. Proceed with the planned screening mammogram.  Left hip pain, possible osteoarthritis   She experiences new onset left hip pain with cracking sounds, possibly indicating osteoarthritis. Discussed arthritis and aging's impact on joint health. Consider a hip X-ray if pain persists or worsens.

## 2024-10-01 NOTE — Assessment & Plan Note (Signed)
 She experiences new onset left hip pain with cracking sounds, possibly indicating osteoarthritis. Discussed arthritis and aging's impact on joint health. Consider a hip X-ray if pain persists or worsens.

## 2024-10-03 DIAGNOSIS — H3552 Pigmentary retinal dystrophy: Secondary | ICD-10-CM | POA: Diagnosis not present

## 2024-10-03 DIAGNOSIS — H26492 Other secondary cataract, left eye: Secondary | ICD-10-CM | POA: Diagnosis not present

## 2024-10-03 DIAGNOSIS — E119 Type 2 diabetes mellitus without complications: Secondary | ICD-10-CM | POA: Diagnosis not present

## 2024-10-03 DIAGNOSIS — Z961 Presence of intraocular lens: Secondary | ICD-10-CM | POA: Diagnosis not present

## 2024-10-03 LAB — OPHTHALMOLOGY REPORT-SCANNED

## 2024-10-10 ENCOUNTER — Other Ambulatory Visit: Payer: Self-pay | Admitting: Internal Medicine

## 2024-10-16 ENCOUNTER — Ambulatory Visit
Admission: RE | Admit: 2024-10-16 | Discharge: 2024-10-16 | Disposition: A | Discharge status: Home / Self Care | Source: Ambulatory Visit | Attending: Internal Medicine | Admitting: Internal Medicine

## 2024-10-16 DIAGNOSIS — Z1231 Encounter for screening mammogram for malignant neoplasm of breast: Secondary | ICD-10-CM

## 2024-10-24 ENCOUNTER — Ambulatory Visit: Payer: Self-pay | Admitting: Internal Medicine

## 2024-12-10 ENCOUNTER — Ambulatory Visit: Admitting: Internal Medicine

## 2025-02-25 ENCOUNTER — Ambulatory Visit: Admitting: Internal Medicine

## 2025-06-11 ENCOUNTER — Ambulatory Visit
